# Patient Record
Sex: Male | Born: 1935 | Race: White | Hispanic: No | Smoking: Former smoker
Health system: Southern US, Community
[De-identification: ages and names within clinical notes are randomized; demographics above are authoritative.]

## PROBLEM LIST (undated history)

## (undated) DIAGNOSIS — I2510 Atherosclerotic heart disease of native coronary artery without angina pectoris: Secondary | ICD-10-CM

## (undated) DIAGNOSIS — Z951 Presence of aortocoronary bypass graft: Secondary | ICD-10-CM

## (undated) DIAGNOSIS — F0390 Unspecified dementia without behavioral disturbance: Secondary | ICD-10-CM

## (undated) DIAGNOSIS — E119 Type 2 diabetes mellitus without complications: Secondary | ICD-10-CM

## (undated) DIAGNOSIS — E785 Hyperlipidemia, unspecified: Secondary | ICD-10-CM

## (undated) DIAGNOSIS — Z95810 Presence of automatic (implantable) cardiac defibrillator: Secondary | ICD-10-CM

## (undated) HISTORY — PX: CORONARY ARTERY BYPASS GRAFT: SHX141

## (undated) HISTORY — PX: PACEMAKER INSERTION: SHX728

---

## 2015-01-04 ENCOUNTER — Inpatient Hospital Stay (HOSPITAL_COMMUNITY): Payer: Medicare Other

## 2015-01-04 ENCOUNTER — Encounter (HOSPITAL_COMMUNITY): Payer: Self-pay | Admitting: Emergency Medicine

## 2015-01-04 ENCOUNTER — Emergency Department (HOSPITAL_COMMUNITY): Payer: Medicare Other

## 2015-01-04 ENCOUNTER — Inpatient Hospital Stay (HOSPITAL_COMMUNITY)
Admission: EM | Admit: 2015-01-04 | Discharge: 2015-01-08 | DRG: 092 | Disposition: A | Discharge status: Skilled Nursing Facility | Payer: Medicare Other | Attending: Internal Medicine | Admitting: Internal Medicine

## 2015-01-04 DIAGNOSIS — I5022 Chronic systolic (congestive) heart failure: Secondary | ICD-10-CM | POA: Diagnosis present

## 2015-01-04 DIAGNOSIS — Z79899 Other long term (current) drug therapy: Secondary | ICD-10-CM | POA: Diagnosis not present

## 2015-01-04 DIAGNOSIS — N183 Chronic kidney disease, stage 3 (moderate): Secondary | ICD-10-CM | POA: Diagnosis present

## 2015-01-04 DIAGNOSIS — R32 Unspecified urinary incontinence: Secondary | ICD-10-CM | POA: Diagnosis present

## 2015-01-04 DIAGNOSIS — Z95810 Presence of automatic (implantable) cardiac defibrillator: Secondary | ICD-10-CM

## 2015-01-04 DIAGNOSIS — I2510 Atherosclerotic heart disease of native coronary artery without angina pectoris: Secondary | ICD-10-CM | POA: Diagnosis present

## 2015-01-04 DIAGNOSIS — R0600 Dyspnea, unspecified: Secondary | ICD-10-CM | POA: Diagnosis not present

## 2015-01-04 DIAGNOSIS — R7989 Other specified abnormal findings of blood chemistry: Secondary | ICD-10-CM | POA: Diagnosis present

## 2015-01-04 DIAGNOSIS — Z7982 Long term (current) use of aspirin: Secondary | ICD-10-CM | POA: Diagnosis not present

## 2015-01-04 DIAGNOSIS — I255 Ischemic cardiomyopathy: Secondary | ICD-10-CM | POA: Diagnosis present

## 2015-01-04 DIAGNOSIS — N179 Acute kidney failure, unspecified: Secondary | ICD-10-CM | POA: Diagnosis present

## 2015-01-04 DIAGNOSIS — Z951 Presence of aortocoronary bypass graft: Secondary | ICD-10-CM

## 2015-01-04 DIAGNOSIS — R1310 Dysphagia, unspecified: Secondary | ICD-10-CM | POA: Diagnosis present

## 2015-01-04 DIAGNOSIS — F0391 Unspecified dementia with behavioral disturbance: Secondary | ICD-10-CM | POA: Diagnosis not present

## 2015-01-04 DIAGNOSIS — G253 Myoclonus: Secondary | ICD-10-CM | POA: Diagnosis present

## 2015-01-04 DIAGNOSIS — F984 Stereotyped movement disorders: Secondary | ICD-10-CM | POA: Diagnosis present

## 2015-01-04 DIAGNOSIS — F518 Other sleep disorders not due to a substance or known physiological condition: Secondary | ICD-10-CM | POA: Diagnosis not present

## 2015-01-04 DIAGNOSIS — F0390 Unspecified dementia without behavioral disturbance: Secondary | ICD-10-CM | POA: Diagnosis present

## 2015-01-04 DIAGNOSIS — E785 Hyperlipidemia, unspecified: Secondary | ICD-10-CM | POA: Diagnosis present

## 2015-01-04 DIAGNOSIS — Z8679 Personal history of other diseases of the circulatory system: Secondary | ICD-10-CM | POA: Insufficient documentation

## 2015-01-04 DIAGNOSIS — E1122 Type 2 diabetes mellitus with diabetic chronic kidney disease: Secondary | ICD-10-CM | POA: Diagnosis present

## 2015-01-04 DIAGNOSIS — N189 Chronic kidney disease, unspecified: Secondary | ICD-10-CM | POA: Insufficient documentation

## 2015-01-04 HISTORY — DX: Unspecified dementia, unspecified severity, without behavioral disturbance, psychotic disturbance, mood disturbance, and anxiety: F03.90

## 2015-01-04 HISTORY — DX: Presence of aortocoronary bypass graft: Z95.1

## 2015-01-04 HISTORY — DX: Type 2 diabetes mellitus without complications: E11.9

## 2015-01-04 HISTORY — DX: Atherosclerotic heart disease of native coronary artery without angina pectoris: I25.10

## 2015-01-04 HISTORY — DX: Presence of automatic (implantable) cardiac defibrillator: Z95.810

## 2015-01-04 HISTORY — DX: Hyperlipidemia, unspecified: E78.5

## 2015-01-04 LAB — CBC WITH DIFFERENTIAL/PLATELET
BASOS ABS: 0 10*3/uL (ref 0.0–0.1)
BASOS PCT: 0 %
EOS ABS: 0.1 10*3/uL (ref 0.0–0.7)
Eosinophils Relative: 1 %
HCT: 40.6 % (ref 39.0–52.0)
HEMOGLOBIN: 13.6 g/dL (ref 13.0–17.0)
Lymphocytes Relative: 14 %
Lymphs Abs: 1.2 10*3/uL (ref 0.7–4.0)
MCH: 29.8 pg (ref 26.0–34.0)
MCHC: 33.5 g/dL (ref 30.0–36.0)
MCV: 89 fL (ref 78.0–100.0)
Monocytes Absolute: 0.5 10*3/uL (ref 0.1–1.0)
Monocytes Relative: 6 %
NEUTROS PCT: 79 %
Neutro Abs: 6.5 10*3/uL (ref 1.7–7.7)
Platelets: 167 10*3/uL (ref 150–400)
RBC: 4.56 MIL/uL (ref 4.22–5.81)
RDW: 14.1 % (ref 11.5–15.5)
WBC: 8.2 10*3/uL (ref 4.0–10.5)

## 2015-01-04 LAB — COMPREHENSIVE METABOLIC PANEL
ALBUMIN: 3.6 g/dL (ref 3.5–5.0)
ALK PHOS: 34 U/L — AB (ref 38–126)
ALT: 18 U/L (ref 17–63)
ANION GAP: 12 (ref 5–15)
AST: 37 U/L (ref 15–41)
BILIRUBIN TOTAL: 1.8 mg/dL — AB (ref 0.3–1.2)
BUN: 68 mg/dL — AB (ref 6–20)
CALCIUM: 9.2 mg/dL (ref 8.9–10.3)
CO2: 19 mmol/L — AB (ref 22–32)
Chloride: 104 mmol/L (ref 101–111)
Creatinine, Ser: 1.95 mg/dL — ABNORMAL HIGH (ref 0.61–1.24)
GFR calc Af Amer: 34 mL/min — ABNORMAL LOW (ref >60)
GFR calc non Af Amer: 29 mL/min — ABNORMAL LOW (ref >60)
GLUCOSE: 157 mg/dL — AB (ref 65–99)
POTASSIUM: 4.7 mmol/L (ref 3.5–5.1)
SODIUM: 135 mmol/L (ref 135–145)
TOTAL PROTEIN: 7 g/dL (ref 6.5–8.1)

## 2015-01-04 LAB — GLUCOSE, CAPILLARY: Glucose-Capillary: 105 mg/dL — ABNORMAL HIGH (ref 65–99)

## 2015-01-04 LAB — TSH: TSH: 2.817 u[IU]/mL (ref 0.350–4.500)

## 2015-01-04 LAB — URINALYSIS, ROUTINE W REFLEX MICROSCOPIC
BILIRUBIN URINE: NEGATIVE
GLUCOSE, UA: 100 mg/dL — AB
KETONES UR: 15 mg/dL — AB
LEUKOCYTES UA: NEGATIVE
Nitrite: NEGATIVE
PH: 5 (ref 5.0–8.0)
Protein, ur: NEGATIVE mg/dL
Specific Gravity, Urine: 1.019 (ref 1.005–1.030)
Urobilinogen, UA: 1 mg/dL (ref 0.0–1.0)

## 2015-01-04 LAB — I-STAT TROPONIN, ED: Troponin i, poc: 0.01 ng/mL (ref 0.00–0.08)

## 2015-01-04 LAB — PROTIME-INR
INR: 1.07 (ref 0.00–1.49)
PROTHROMBIN TIME: 14.1 s (ref 11.6–15.2)

## 2015-01-04 LAB — CK: Total CK: 149 U/L (ref 49–397)

## 2015-01-04 LAB — URINE MICROSCOPIC-ADD ON

## 2015-01-04 LAB — CBG MONITORING, ED: GLUCOSE-CAPILLARY: 110 mg/dL — AB (ref 65–99)

## 2015-01-04 LAB — I-STAT CG4 LACTIC ACID, ED: LACTIC ACID, VENOUS: 1.81 mmol/L (ref 0.5–2.0)

## 2015-01-04 LAB — PHOSPHORUS: PHOSPHORUS: 4 mg/dL (ref 2.5–4.6)

## 2015-01-04 LAB — VITAMIN B12: VITAMIN B 12: 320 pg/mL (ref 180–914)

## 2015-01-04 LAB — MAGNESIUM: MAGNESIUM: 2.4 mg/dL (ref 1.7–2.4)

## 2015-01-04 MED ORDER — ASPIRIN EC 81 MG PO TBEC
81.0000 mg | DELAYED_RELEASE_TABLET | Freq: Every day | ORAL | Status: DC
  Administered 2015-01-05 – 2015-01-08 (×4): 81 mg via ORAL
  Filled 2015-01-04 (×4): qty 1

## 2015-01-04 MED ORDER — INSULIN ASPART 100 UNIT/ML ~~LOC~~ SOLN
0.0000 [IU] | Freq: Three times a day (TID) | SUBCUTANEOUS | Status: DC
  Administered 2015-01-06: 3 [IU] via SUBCUTANEOUS
  Administered 2015-01-06: 1 [IU] via SUBCUTANEOUS
  Administered 2015-01-07: 3 [IU] via SUBCUTANEOUS
  Administered 2015-01-07 – 2015-01-08 (×3): 2 [IU] via SUBCUTANEOUS
  Administered 2015-01-08: 3 [IU] via SUBCUTANEOUS

## 2015-01-04 MED ORDER — SODIUM CHLORIDE 0.9 % IV SOLN
INTRAVENOUS | Status: DC
  Administered 2015-01-04: 23:00:00 via INTRAVENOUS

## 2015-01-04 MED ORDER — ONDANSETRON HCL 4 MG/2ML IJ SOLN: 4.0000 mg | Freq: Four times a day (QID) | INTRAMUSCULAR | Status: DC | PRN

## 2015-01-04 MED ORDER — LORAZEPAM 2 MG/ML IJ SOLN
0.5000 mg | Freq: Once | INTRAMUSCULAR | Status: AC
  Administered 2015-01-04: 0.5 mg via INTRAVENOUS
  Filled 2015-01-04: qty 1

## 2015-01-04 MED ORDER — ACETAMINOPHEN 650 MG RE SUPP: 650.0000 mg | Freq: Four times a day (QID) | RECTAL | Status: DC | PRN

## 2015-01-04 MED ORDER — ENOXAPARIN SODIUM 30 MG/0.3ML ~~LOC~~ SOLN
30.0000 mg | SUBCUTANEOUS | Status: DC
  Administered 2015-01-04 – 2015-01-07 (×4): 30 mg via SUBCUTANEOUS
  Filled 2015-01-04 (×5): qty 0.3

## 2015-01-04 MED ORDER — ASPIRIN 81 MG PO TABS: 81.0000 mg | ORAL_TABLET | Freq: Every day | ORAL | Status: DC

## 2015-01-04 MED ORDER — METOPROLOL SUCCINATE ER 25 MG PO TB24
50.0000 mg | ORAL_TABLET | Freq: Every day | ORAL | Status: DC
  Administered 2015-01-05 – 2015-01-08 (×4): 50 mg via ORAL
  Filled 2015-01-04 (×4): qty 2

## 2015-01-04 MED ORDER — QUETIAPINE FUMARATE 25 MG PO TABS
25.0000 mg | ORAL_TABLET | Freq: Every day | ORAL | Status: DC
  Administered 2015-01-05 – 2015-01-06 (×2): 25 mg via ORAL
  Filled 2015-01-04 (×3): qty 1

## 2015-01-04 MED ORDER — ACETAMINOPHEN 325 MG PO TABS: 650.0000 mg | ORAL_TABLET | Freq: Four times a day (QID) | ORAL | Status: DC | PRN

## 2015-01-04 MED ORDER — ONDANSETRON HCL 4 MG PO TABS: 4.0000 mg | ORAL_TABLET | Freq: Four times a day (QID) | ORAL | Status: DC | PRN

## 2015-01-04 NOTE — Progress Notes (Signed)
  Echocardiogram 2D Echocardiogram has been performed.  Janalyn Harder 01/04/2015, 3:08 PM

## 2015-01-04 NOTE — H&P (Addendum)
[facility] History and Physical  Christopher Waller KGM:[id number] DOB: [birth year] DOA: 01/04/2015  Referring physician:  PCP: No primary care provider on file.   Chief Complaint: spasms  HPI: Christopher Waller is a 81 y.o. male with PMH of CAd/CABG, AICD, DM, Early Dementia, Hearing loss, was sent to the ER by his nephew who is his healthcare power of attorney and primary caregiver. Patient and nephew Christopher Waller, along with Christopher Waller wife moved from [place] to 3 months ago, they are yet to see a primary doctor here, his nephew reports that for the last 2-3 days they have noticed intermittent jerking movements involving upper extremities primarily and sometimes lower legs. No loss of consciousness. No fever or chills no trauma or fall. No recent change in his medications, he was started on Seroquel 6-7 months ago for anxiety. In the ER workup notable for renal insufficiency, CT head unremarkable. EDP discussed with neurologist on call who recommended transfer to [facility] for video EEG  Review of Systems: Unable to obtain due to dementia Constitutional:  No weight loss, night sweats, Fevers, chills, fatigue.  HEENT:  No headaches, Difficulty swallowing,Tooth/dental problems,Sore throat,  No sneezing, itching, ear ache, nasal congestion, post nasal drip,  Cardio-vascular:  No chest pain, Orthopnea, PND, swelling in lower extremities, anasarca, dizziness, palpitations  GI:  No heartburn, indigestion, abdominal pain, nausea, vomiting, diarrhea, change in bowel habits, loss of appetite  Resp:  No shortness of breath with exertion or at rest. No excess mucus, no productive cough, No non-productive cough, No coughing up of blood.No change in color of mucus.No wheezing.No chest wall deformity  Skin:  no rash or lesions.  GU:  no dysuria, change in color of urine, no urgency or frequency. No flank pain.  Musculoskeletal:  No joint pain or swelling. No decreased range of motion.  No back pain.  Psych:  No change in mood or affect. No depression or anxiety. No memory loss.   Past Medical History  Diagnosis Date  . Coronary artery disease    Past Surgical History  Procedure Laterality Date  . Pacemaker insertion    . Coronary artery bypass graft     Social History:  has no tobacco, alcohol, and drug history on file.  Allergies not on file  No family history  -unable to obtain due to dementia  Prior to Admission medications   Medication Sig Start Date End Date Taking? Authorizing Provider  aspirin 81 MG tablet Take 81 mg by mouth daily.   Yes Historical Provider, MD  fenofibrate (TRICOR) 145 MG tablet Take 145 mg by mouth daily.   Yes Historical Provider, MD  furosemide (LASIX) 40 MG tablet Take 40 mg by mouth daily.   Yes Historical Provider, MD  glipiZIDE (GLUCOTROL XL) 10 MG 24 hr tablet Take 10 mg by mouth daily with breakfast.   Yes Historical Provider, MD  lisinopril (PRINIVIL,ZESTRIL) 10 MG tablet Take 10 mg by mouth daily.   Yes Historical Provider, MD  metoprolol succinate (TOPROL-XL) 50 MG 24 hr tablet Take 50 mg by mouth daily. Take with or immediately following a meal.   Yes Historical Provider, MD  QUEtiapine (SEROQUEL) 50 MG tablet Take 50 mg by mouth 2 (two) times daily.   Yes Historical Provider, MD  sitaGLIPtin (JANUVIA) 100 MG tablet Take 100 mg by mouth daily.   Yes Historical Provider, MD   Physical Exam: Filed Vitals:   01/04/15 1046 01/04/15 1157 01/04/15 1257  BP: 118/68 101/51 111/74  Pulse: 90 110  154  Temp: 98.2 F (36.8 C)    TempSrc: Oral    Resp: SpO2: 99% 95% 79%    Wt Readings from Last 3 Encounters:  No data found for Wt    General:  Appears calm and comfortable, with intermittent jerky movement, orienetd to self only Eyes: PERRL, normal lids, irises & conjunctiva ENT: grossly normal hearing, lips & tongue Neck: no LAD, masses or thyromegaly Cardiovascular: RRR, no m/r/g. No LE edema. Telemetry: SR, no  arrhythmias  Respiratory: CTA bilaterally, no w/r/r. Normal respiratory effort. Abdomen: soft, ntnd Skin: no rash or induration seen on limited exam Musculoskeletal: grossly normal tone BUE/BLE Psychiatric: flat affect Neurologic: increased tone in upper and lower extremities, myoclonic jerking of upper and lower extremities          Labs on Admission:  Basic Metabolic Panel:  Recent Labs Lab 01/04/15 1103  NA 135  K 4.7  CL 104  CO2 19*  GLUCOSE 157*  BUN 68*  CREATININE 1.95*  CALCIUM 9.2  MG 2.4  PHOS 4.0   Liver Function Tests:  Recent Labs Lab 01/04/15 1103  AST 37  ALT 18  ALKPHOS 34*  BILITOT 1.8*  PROT 7.0  ALBUMIN 3.6   No results for input(s): LIPASE, AMYLASE in the last 168 hours. No results for input(s): AMMONIA in the last 168 hours. CBC:  Recent Labs Lab 01/04/15 1103  WBC 8.2  NEUTROABS 6.5  HGB 13.6  HCT 40.6  MCV 89.0  PLT 167   Cardiac Enzymes:  Recent Labs Lab 01/04/15 1103  CKTOTAL 149    BNP (last 3 results) No results for input(s): BNP in the last 8760 hours.  ProBNP (last 3 results) No results for input(s): PROBNP in the last 8760 hours.  CBG: No results for input(s): GLUCAP in the last 168 hours.  Radiological Exams on Admission: Ct Head Wo Contrast  01/04/2015   CLINICAL DATA:  Seizure  EXAM: CT HEAD WITHOUT CONTRAST  TECHNIQUE: Contiguous axial images were obtained from the base of the skull through the vertex without intravenous contrast.  COMPARISON:  None.  FINDINGS: Moderate global atrophy. Minimal chronic ischemic changes in the periventricular white matter. No mass effect, midline shift, or acute hemorrhage. Mastoid air cells clear. Small mucous retention cyst in the right maxillary sinus. Nasal septum is somewhat deviated. Sinuses are otherwise clear. Cranium is intact.  IMPRESSION: No acute intracranial pathology.  Chronic changes are noted.   Electronically Signed   By: Jolaine Click M.D.   On: 01/04/2015 11:51     EKG: Independently reviewed. AV paced  Assessment/Plan    Myoclonic jerking -could related to Extra pyramidal side effects from Seroquel, Renal failure could be contributing -CT head unremarkabkle -will wean off Seroquel, cut down from  BID to  QHS and stop -Neuro consulted by EDP, recommended Transfer to [facility] and video EEG -Potassium, Calcium and magnesium within normal limits, also check ammonia level      H/o CAD/CABG and Cardiomyopathy with ACID -stable, continue ASA, Toprol, hold ACE -check ECHO for baseline -hold lasix at this time    DM -SSI, hold oral meds, check Hbaic     Mild Cognitive dysfunction/Early dementia -check TSH, B12    Renal insufficency -baseline unknown, hydrate, hold ACE and monitor  Code Status: Full Code DVT Prophylaxis: lovenox Family Communication: nephew Christopher Waller at bedside Disposition Plan: transfer to [facility]  Time spent:  [facility] Pager [phone]

## 2015-01-04 NOTE — Consult Note (Addendum)
Admission H&P    Chief Complaint: New onset myoclonic like movements.  HPI: Christopher Waller is an 81 y.o. male with a history of IVs mellitus, dementia, hyperlipidemia, AICD placement and coronary artery disease, presenting with new onset involuntary myoclonic like movements of extremities. Onset was 2-3 days ago according to family. Movements are present during wakefulness as well as during sleep. He's had no recent changes in medications. He is on cervical well 50 mg twice a day and has been on this dose of medication for 6-7 months. He does have signs of renal failure with BUN of 68 and creatinine 1.95. Blood sugar was 157. Patient has had no changes in level of consciousness nor apparent noticeable deterioration in cognitive functioning over the past few days. CT scan of his head showed no acute intracranial abnormality. Serum electrolytes are unremarkable. Liver enzymes were also unremarkable.  Past Medical History  Diagnosis Date  . Coronary artery disease   . Dementia   . Diabetes mellitus without complication ([place])   . Hyperlipidemia     Past Surgical History  Procedure Laterality Date  . Pacemaker insertion    . Coronary artery bypass graft      Family history: Unavailable due to patient's dementia.  Social History:  has no tobacco, alcohol, and drug history on file.  Allergies: No Known Allergies  Medications Prior to Admission  Medication Sig Dispense Refill  . aspirin 81 MG tablet Take 81 mg by mouth daily with breakfast.     . fenofibrate (TRICOR) 145 MG tablet Take 145 mg by mouth at bedtime.     . furosemide (LASIX) 40 MG tablet Take 40 mg by mouth daily with breakfast.     . glipiZIDE (GLUCOTROL XL) 10 MG 24 hr tablet Take 10 mg by mouth daily with breakfast.    . lisinopril (PRINIVIL,ZESTRIL) 10 MG tablet Take 10 mg by mouth at bedtime.     . Meclizine HCl (BONINE PO) Take 1 tablet by mouth daily as needed (for dizziness).    . metoprolol succinate (TOPROL-XL) 50  MG 24 hr tablet Take 50 mg by mouth daily with breakfast. Take with or immediately following a meal.    . QUEtiapine (SEROQUEL) 50 MG tablet Take 50 mg by mouth 2 (two) times daily.    . sitaGLIPtin (JANUVIA) 100 MG tablet Take 100 mg by mouth daily with breakfast.       ROS: Unavailable due to patient's dementia.  Physical Examination: Blood pressure 93/51, pulse 73, temperature 98.4 F (36.9 C), temperature source Oral, resp. rate 23, SpO2 100 %.  HEENT-  Normocephalic, no lesions, without obvious abnormality.  Normal external eye and conjunctiva.  Normal TM's bilaterally.  Normal auditory canals and external ears. Normal external nose, mucus membranes and septum.  Normal pharynx. Neck supple with no masses, nodes, nodules or enlargement. Cardiovascular - regular rate and rhythm, S1, S2 normal, no murmur, click, rub or gallop Lungs - chest clear, no wheezing, rales, normal symmetric air entry Abdomen - soft, non-tender; bowel sounds normal; no masses,  no organomegaly Extremities - no edema and no skin discoloration  Neurologic Examination: Mental Status: Alert, disoriented to time and moderately confused.  Speech fluent without evidence of aphasia. Able to follow simple commands with mimicking (marked hearing loss). Cranial Nerves: II-Visual fields were normal with confrontation. III/IV/VI-Pupils were equal and reacted. Extraocular movements were full and conjugate.    V/VII-no facial numbness and no facial weakness. VIII-marked difficulty with hearing bilaterally. X-minimal dysarthria. XI: trapezius  strength/neck flexion strength normal bilaterally XII-midline tongue extension with normal strength Motor: 5/5 bilaterally with normal tone and bulk; frequent myoclonic-like movements of upper and lower extremities occurring primarily in upper extremities, either involving one extremity, or both simultaneously. Abnormal movements did not appear to be activity induced, and were present  during sleep prior to patient being aroused. Sensory: Normal throughout. Deep Tendon Reflexes: Trace only and symmetric in upper extremities and absent in lower extremities. Plantars: Mute bilaterally Cerebellar: Normal finger-to-nose testing except for minimal intention tremor bilaterally. Carotid auscultation: Normal  Results for orders placed or performed during the hospital encounter of 01/04/15 (from the past 48 hour(s))  CBC with Differential/Platelet     Status: None   Collection Time: 01/04/15 11:03 AM  Result Value Ref Range   WBC 8.2 4.0 - 10.5 K/uL   RBC 4.56 4.22 - 5.81 MIL/uL   Hemoglobin 13.6 13.0 - 17.0 g/dL   HCT 40.6 39.0 - 52.0 %   MCV 89.0 78.0 - 100.0 fL   MCH 29.8 26.0 - 34.0 pg   MCHC 33.5 30.0 - 36.0 g/dL   RDW 14.1 11.5 - 15.5 %   Platelets 167 150 - 400 K/uL   Neutrophils Relative % 79 %   Neutro Abs 6.5 1.7 - 7.7 K/uL   Lymphocytes Relative 14 %   Lymphs Abs 1.2 0.7 - 4.0 K/uL   Monocytes Relative 6 %   Monocytes Absolute 0.5 0.1 - 1.0 K/uL   Eosinophils Relative 1 %   Eosinophils Absolute 0.1 0.0 - 0.7 K/uL   Basophils Relative 0 %   Basophils Absolute 0.0 0.0 - 0.1 K/uL  Comprehensive metabolic panel     Status: Abnormal   Collection Time: 01/04/15 11:03 AM  Result Value Ref Range   Sodium 135 135 - 145 mmol/L   Potassium 4.7 3.5 - 5.1 mmol/L   Chloride 104 101 - 111 mmol/L   CO2 19 (L) 22 - 32 mmol/L   Glucose, Bld 157 (H) 65 - 99 mg/dL   BUN 68 (H) 6 - 20 mg/dL   Creatinine, Ser 1.95 (H) 0.61 - 1.24 mg/dL   Calcium 9.2 8.9 - 10.3 mg/dL   Total Protein 7.0 6.5 - 8.1 g/dL   Albumin 3.6 3.5 - 5.0 g/dL   AST 37 15 - 41 U/L   ALT 18 17 - 63 U/L   Alkaline Phosphatase 34 (L) 38 - 126 U/L   Total Bilirubin 1.8 (H) 0.3 - 1.2 mg/dL   GFR calc non Af Amer 29 (L) >60 mL/min   GFR calc Af Amer 34 (L) >60 mL/min    Comment: (NOTE) The eGFR has been calculated using the CKD EPI equation. This calculation has not been validated in all clinical  situations. eGFR's persistently <60 mL/min signify possible Chronic Kidney Disease.    Anion gap 12 5 - 15  Protime-INR     Status: None   Collection Time: 01/04/15 11:03 AM  Result Value Ref Range   Prothrombin Time 14.1 11.6 - 15.2 seconds   INR 1.07 0.00 - 1.49  Magnesium     Status: None   Collection Time: 01/04/15 11:03 AM  Result Value Ref Range   Magnesium 2.4 1.7 - 2.4 mg/dL  Phosphorus     Status: None   Collection Time: 01/04/15 11:03 AM  Result Value Ref Range   Phosphorus 4.0 2.5 - 4.6 mg/dL  CK     Status: None   Collection Time: 01/04/15 11:03 AM  Result  Value Ref Range   Total CK 149 49 - 397 U/L  I-stat troponin, ED     Status: None   Collection Time: 01/04/15 11:29 AM  Result Value Ref Range   Troponin i, poc 0.01 0.00 - 0.08 ng/mL   Comment 3            Comment: Due to the release kinetics of cTnI, a negative result within the first hours of the onset of symptoms does not rule out myocardial infarction with certainty. If myocardial infarction is still suspected, repeat the test at appropriate intervals.   I-Stat CG4 Lactic Acid, ED     Status: None   Collection Time: 01/04/15 11:31 AM  Result Value Ref Range   Lactic Acid, Venous 1.81 0.5 - 2.0 mmol/L  Urinalysis, Routine w reflex microscopic (not at [facility])     Status: Abnormal   Collection Time: 01/04/15  1:20 PM  Result Value Ref Range   Color, Urine YELLOW YELLOW   APPearance CLOUDY (A) CLEAR   Specific Gravity, Urine 1.019 1.005 - 1.030   pH 5.0 5.0 - 8.0   Glucose, UA 100 (A) NEGATIVE mg/dL   Hgb urine dipstick TRACE (A) NEGATIVE   Bilirubin Urine NEGATIVE NEGATIVE   Ketones, ur 15 (A) NEGATIVE mg/dL   Protein, ur NEGATIVE NEGATIVE mg/dL   Urobilinogen, UA 1.0 0.0 - 1.0 mg/dL   Nitrite NEGATIVE NEGATIVE   Leukocytes, UA NEGATIVE NEGATIVE  Urine microscopic-add on     Status: Abnormal   Collection Time: 01/04/15  1:20 PM  Result Value Ref Range   Squamous Epithelial / LPF RARE RARE   WBC,  UA 0-2 <3 WBC/hpf   RBC / HPF 3-6 <3 RBC/hpf   Bacteria, UA MANY (A) RARE   Urine-Other MUCOUS PRESENT   CBG monitoring, ED     Status: Abnormal   Collection Time: 01/04/15  5:37 PM  Result Value Ref Range   Glucose-Capillary 110 (H) 65 - 99 mg/dL   Ct Head Wo Contrast  01/04/2015   CLINICAL DATA:  Seizure  EXAM: CT HEAD WITHOUT CONTRAST  TECHNIQUE: Contiguous axial images were obtained from the base of the skull through the vertex without intravenous contrast.  COMPARISON:  None.  FINDINGS: Moderate global atrophy. Minimal chronic ischemic changes in the periventricular white matter. No mass effect, midline shift, or acute hemorrhage. Mastoid air cells clear. Small mucous retention cyst in the right maxillary sinus. Nasal septum is somewhat deviated. Sinuses are otherwise clear. Cranium is intact.  IMPRESSION: No acute intracranial pathology.  Chronic changes are noted.   Electronically Signed   By: Marybelle Killings M.D.   On: 01/04/2015 11:51    Assessment/Plan 81 year old man with history of dementia, diabetes mellitus, hyperlipidemia, coronary artery disease and AICD placement, with new onset involuntary myoclonic like movements of unclear etiology. Abnormal movements do not appear to be movement induced. Seizure activity is unlikely due to the widespread distribution of abnormal movements with no apparent changes in patient's level of alertness nor a sling cognitive functioning. Renal dysfunction may be a contributing factor.  Recommendations: 1. MRI of the brain without contrast 2. EEG, routine adult study. If EEG shows indications of focal status epilepticus, which is unlikely, video EEG long-term monitoring is recommended. 3. Serum ammonia level. 4. Nephrology consult  We will continue to follow this patient with you.  C.R. Nicole Kindred, [place] [facility] [phone]  01/04/2015, 8:12 PM

## 2015-01-04 NOTE — ED Notes (Signed)
[facility] here 

## 2015-01-04 NOTE — ED Notes (Signed)
Pt from home, sent to ED by care taker due to concern of UTI, spasm or possible pacemaker firing,. Pt HOH not able to give a goiod hx, pt denies pain. Bruise to LT under eye, incoint on arrival, alert to person and place,

## 2015-01-04 NOTE — ED Notes (Signed)
Patient transported to CT 

## 2015-01-04 NOTE — ED Notes (Signed)
Called to give report at [facility], nurse will have to call me back.

## 2015-01-04 NOTE — ED Notes (Signed)
Bed: WA06 Expected date:  Expected time:  Means of arrival:  Comments: Elderly spasms

## 2015-01-04 NOTE — Progress Notes (Signed)
Patient arrived around 1830, EMT ran several cardio strips on him and were able to indicate that upper body tremors coincide directly with patients AICD administering defibrillating shock. Patient BP is soft on arrival he is alert to self with poor memory and poor lower body strength and movement. Will give full details to oncoming nurse. Christopher Waller

## 2015-01-04 NOTE — ED Provider Notes (Addendum)
CSN: [id number]     Arrival date & time 01/04/15  1027 History   First MD Initiated Contact with Patient 01/04/15 1047     Chief Complaint  Patient presents with  . Urinary Incontinence  . Spasms     (Consider location/radiation/quality/duration/timing/severity/associated sxs/prior Treatment) HPI  Patient is a demented 81 year old male presenting with jerking motions. Patient is not accompanied by any family members. Per EMS patient's been working more than usual for the last 3 days. EMS was concerned that it was his pacemaker firing. It appears that he is AV paced.   L5 caveat dementia.  Past Medical History  Diagnosis Date  . Coronary artery disease    Past Surgical History  Procedure Laterality Date  . Pacemaker insertion    . Coronary artery bypass graft     No family history on file. Social History  Substance Use Topics  . Smoking status: Not on file  . Smokeless tobacco: Not on file  . Alcohol Use: Not on file    Review of Systems  Unable to perform ROS: Dementia      Allergies  Review of patient's allergies indicates not on file.  Home Medications   Prior to Admission medications   Medication Sig Start Date End Date Taking? Authorizing Provider  aspirin 81 MG tablet Take 81 mg by mouth daily.   Yes Historical Provider, MD  fenofibrate (TRICOR) 145 MG tablet Take 145 mg by mouth daily.   Yes Historical Provider, MD  furosemide (LASIX) 40 MG tablet Take 40 mg by mouth daily.   Yes Historical Provider, MD  glipiZIDE (GLUCOTROL XL) 10 MG 24 hr tablet Take 10 mg by mouth daily with breakfast.   Yes Historical Provider, MD  lisinopril (PRINIVIL,ZESTRIL) 10 MG tablet Take 10 mg by mouth daily.   Yes Historical Provider, MD  metoprolol succinate (TOPROL-XL) 50 MG 24 hr tablet Take 50 mg by mouth daily. Take with or immediately following a meal.   Yes Historical Provider, MD  QUEtiapine (SEROQUEL) 50 MG tablet Take 50 mg by mouth 2 (two) times daily.   Yes  Historical Provider, MD  sitaGLIPtin (JANUVIA) 100 MG tablet Take 100 mg by mouth daily.   Yes Historical Provider, MD   BP 111/74 mmHg  Pulse 154  Temp(Src) 98.2 F (36.8 C) (Oral)  Resp 22  SpO2 79% Physical Exam  Constitutional: He is oriented to person, place, and time. He appears well-nourished.  Jerking motions bilateral upper extremities.  HENT:  Head: Normocephalic.  Eyes: Conjunctivae are normal.  Cardiovascular: Normal rate, regular rhythm and normal heart sounds.   Pulmonary/Chest: Effort normal. No respiratory distress. He has no wheezes. He has no rales.  Neurological: He is oriented to person, place, and time.  Skin: Skin is warm and dry. He is not diaphoretic.  Psychiatric: He has a normal mood and affect. His behavior is normal.    ED Course  Procedures (including critical care time) Labs Review Labs Reviewed  COMPREHENSIVE METABOLIC PANEL - Abnormal; Notable for the following:    CO2 19 (*)    Glucose, Bld 157 (*)    BUN 68 (*)    Creatinine, Ser 1.95 (*)    Alkaline Phosphatase 34 (*)    Total Bilirubin 1.8 (*)    GFR calc non Af Amer 29 (*)    GFR calc Af Amer 34 (*)    All other components within normal limits  URINE CULTURE  CBC WITH DIFFERENTIAL/PLATELET  PROTIME-INR  MAGNESIUM  PHOSPHORUS  CK  URINALYSIS, ROUTINE W REFLEX MICROSCOPIC (NOT AT [facility])  I-STAT TROPOININ, ED  I-STAT CG4 LACTIC ACID, ED  I-STAT CG4 LACTIC ACID, ED    Imaging Review Ct Head Wo Contrast  01/04/2015   CLINICAL DATA:  Seizure  EXAM: CT HEAD WITHOUT CONTRAST  TECHNIQUE: Contiguous axial images were obtained from the base of the skull through the vertex without intravenous contrast.  COMPARISON:  None.  FINDINGS: Moderate global atrophy. Minimal chronic ischemic changes in the periventricular white matter. No mass effect, midline shift, or acute hemorrhage. Mastoid air cells clear. Small mucous retention cyst in the right maxillary sinus. Nasal septum is somewhat deviated.  Sinuses are otherwise clear. Cranium is intact.  IMPRESSION: No acute intracranial pathology.  Chronic changes are noted.   Electronically Signed   By: Jolaine Click M.D.   On: 01/04/2015 11:51   I have personally reviewed and evaluated these images and lab results as part of my medical decision-making.   EKG Interpretation None      MDM   Final diagnoses:  None    Patient is an 81 year old man presented with increased jerking motion. Unfortunately there is no family with patient so we cannot get a more detailed excretion. We will run labs, including urinary analysis. Patient smells like urine.  These jerks are slightly abnormal  because after some of the jerks, he appears to have a moment where he is out of it. I'm concerned about these being atypical complex focal seizures. I talked to neurology. We will transfer to [facility] to get a video EEG.   Ylonda Storr Randall An, MD 01/04/15 1336  Bernard Donahoo Randall An, MD 01/04/15 1345

## 2015-01-04 NOTE — ED Notes (Signed)
Family POA [name] (nephew) number [phone]

## 2015-01-04 NOTE — ED Notes (Signed)
RN unable to take report at this time.  Will call back.

## 2015-01-04 NOTE — Care Management Note (Addendum)
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  Christopher Waller nephew, caregiver has used recommendation of some neighbors and has only called 1 doctor but Doctor unable to see pt for 2 months.  Christopher Waller wife of Christopher Waller [phone]     C/o UTI, spasm or possible pacemaker firing,. HOH   Pt physically noted with spasms while CM visiting   Pt not a good historian at this time  Action/Plan: Cm spoke with ED RN & Christopher Waller Cm confirmed correct address and updated EPIC bu placing Christopher Waller number in as contact  Provided lists of medicare doctors for internal medicine, cardiology, urology and podiatry within zip [phone] to assist with finding new providers  CM wrote on each what pt would need doctor for  Left in pt belonging bag on pt bed for family Family not in room when CM returned Pt updated He nodded his head  Expected Discharge Date:     Pending labs             Expected Discharge Plan:     IStatus of Service:   completed  Additional Comments:  Christopher Shoulder, RN 01/04/2015, 1:49 PM

## 2015-01-05 ENCOUNTER — Inpatient Hospital Stay (HOSPITAL_COMMUNITY)
Admit: 2015-01-05 | Discharge: 2015-01-05 | Disposition: A | Discharge status: Home / Self Care | Payer: Medicare Other | Attending: Neurology | Admitting: Neurology

## 2015-01-05 ENCOUNTER — Encounter (HOSPITAL_COMMUNITY): Payer: Self-pay | Admitting: Internal Medicine

## 2015-01-05 DIAGNOSIS — I5022 Chronic systolic (congestive) heart failure: Secondary | ICD-10-CM | POA: Insufficient documentation

## 2015-01-05 DIAGNOSIS — N183 Chronic kidney disease, stage 3 (moderate): Secondary | ICD-10-CM

## 2015-01-05 DIAGNOSIS — Z95810 Presence of automatic (implantable) cardiac defibrillator: Secondary | ICD-10-CM

## 2015-01-05 DIAGNOSIS — N189 Chronic kidney disease, unspecified: Secondary | ICD-10-CM | POA: Insufficient documentation

## 2015-01-05 DIAGNOSIS — E785 Hyperlipidemia, unspecified: Secondary | ICD-10-CM | POA: Insufficient documentation

## 2015-01-05 DIAGNOSIS — Z951 Presence of aortocoronary bypass graft: Secondary | ICD-10-CM

## 2015-01-05 DIAGNOSIS — Z8679 Personal history of other diseases of the circulatory system: Secondary | ICD-10-CM | POA: Insufficient documentation

## 2015-01-05 DIAGNOSIS — G253 Myoclonus: Principal | ICD-10-CM

## 2015-01-05 HISTORY — DX: Presence of aortocoronary bypass graft: Z95.1

## 2015-01-05 HISTORY — DX: Presence of automatic (implantable) cardiac defibrillator: Z95.810

## 2015-01-05 LAB — COMPREHENSIVE METABOLIC PANEL
ALT: 15 U/L — AB (ref 17–63)
AST: 28 U/L (ref 15–41)
Albumin: 3.1 g/dL — ABNORMAL LOW (ref 3.5–5.0)
Alkaline Phosphatase: 31 U/L — ABNORMAL LOW (ref 38–126)
Anion gap: 10 (ref 5–15)
BILIRUBIN TOTAL: 1.3 mg/dL — AB (ref 0.3–1.2)
BUN: 51 mg/dL — AB (ref 6–20)
CO2: 25 mmol/L (ref 22–32)
CREATININE: 1.72 mg/dL — AB (ref 0.61–1.24)
Calcium: 9.4 mg/dL (ref 8.9–10.3)
Chloride: 108 mmol/L (ref 101–111)
GFR, EST AFRICAN AMERICAN: 39 mL/min — AB (ref >60)
GFR, EST NON AFRICAN AMERICAN: 34 mL/min — AB (ref >60)
Glucose, Bld: 105 mg/dL — ABNORMAL HIGH (ref 65–99)
POTASSIUM: 4.3 mmol/L (ref 3.5–5.1)
Sodium: 143 mmol/L (ref 135–145)
TOTAL PROTEIN: 6.3 g/dL — AB (ref 6.5–8.1)

## 2015-01-05 LAB — GLUCOSE, CAPILLARY
GLUCOSE-CAPILLARY: 89 mg/dL (ref 65–99)
GLUCOSE-CAPILLARY: 89 mg/dL (ref 65–99)
Glucose-Capillary: 107 mg/dL — ABNORMAL HIGH (ref 65–99)
Glucose-Capillary: 114 mg/dL — ABNORMAL HIGH (ref 65–99)

## 2015-01-05 LAB — URINE CULTURE: CULTURE: NO GROWTH

## 2015-01-05 LAB — HEMOGLOBIN A1C
HEMOGLOBIN A1C: 8.2 % — AB (ref 4.8–5.6)
Mean Plasma Glucose: 189 mg/dL

## 2015-01-05 MED ORDER — FENOFIBRATE 160 MG PO TABS
160.0000 mg | ORAL_TABLET | Freq: Every day | ORAL | Status: DC
  Administered 2015-01-05 – 2015-01-08 (×4): 160 mg via ORAL
  Filled 2015-01-05 (×4): qty 1

## 2015-01-05 MED ORDER — FUROSEMIDE 40 MG PO TABS
40.0000 mg | ORAL_TABLET | Freq: Every day | ORAL | Status: DC
  Administered 2015-01-05 – 2015-01-08 (×4): 40 mg via ORAL
  Filled 2015-01-05 (×4): qty 1

## 2015-01-05 NOTE — Progress Notes (Signed)
EEG completed; results pending.    

## 2015-01-05 NOTE — Progress Notes (Addendum)
Called patients [employer] and obtained information on AICD manufacture is [place], [place] # [id number] patients account # [id number].  [employer]  Rep # [phone] I will called and paged the representative awaiting call back. [employer] has been notified and is on the way, also the device was replaced in 2014 and had a wire repair after that the device serial # is [id number] H model # may be [id number]

## 2015-01-05 NOTE — Evaluation (Signed)
Clinical/Bedside Swallow Evaluation Patient Details  Name: Christopher Waller MRN: [id number] Date of Birth: 1924-09-25  Today's Date: 01/05/2015 Time: SLP Start Time (ACUTE ONLY): 0930 SLP Stop Time (ACUTE ONLY): 0955 SLP Time Calculation (min) (ACUTE ONLY): 25 min  Past Medical History:  Past Medical History  Diagnosis Date  . Coronary artery disease   . Dementia   . Diabetes mellitus without complication (HCC)   . Hyperlipidemia    Past Surgical History:  Past Surgical History  Procedure Laterality Date  . Pacemaker insertion    . Coronary artery bypass graft     HPI:  81 y.o. male with PMH of CAd/CABG, AICD, DM, Early Dementia, Hearing loss, was sent to the ER by his nephew who is his healthcare power of attorney and primary caregiver.   Assessment / Plan / Recommendation Clinical Impression   Pt exhibited wet vocal quality intermittently during BSE with various consistencies; weak cough elicited and unable to elicit a swallow initially, but pt exhibited xerostomia and nursing informed SLP he was also a "mouth breather" at night.  Pt failed swallow screen d/t vocal quality being poor; no delayed or immediate coughing noted with any consistency during BSE; pt exhibits speech which appears dysarthric and possible velopharyngeal insufficiency as well.  OME was limited, but velar involvement noted.  Pt also exhibits cognitive deficits which impact his swallowing as well as he was noted frequently to become distracted during intake and exhibited impulsivity as well, which could impact safety of swallowing.  MBS recommended with Dysphagia 2/thin diet and potential upgrade pending MBS.    Aspiration Risk  Moderate    Diet Recommendation Dysphagia 2 (Fine chop);Thin   Medication Administration: Whole meds with puree Compensations: Slow rate;Small sips/bites    Other  Recommendations Oral Care Recommendations: Oral care QID   Follow Up Recommendations       Frequency and Duration min  2x/week  2 weeks   Pertinent Vitals/Pain Elevated BP; afebrile    SLP Swallow Goals  See POC; pending MBS   Swallow Study Prior Functional Status   Recent move from [place]; modified independent    General Date of Onset: 01/04/15 Other Pertinent Information: 81 y.o. male with PMH of CAd/CABG, AICD, DM, Early Dementia, Hearing loss, was sent to the ER by his nephew who is his healthcare power of attorney and primary caregiver. Type of Study: Bedside swallow evaluation Previous Swallow Assessment: Stroke swallow screen; failed d/t vocal quality poor Diet Prior to this Study: NPO Temperature Spikes Noted: No Respiratory Status: Room air History of Recent Intubation: No Behavior/Cognition: Alert;Cooperative;Requires cueing;Other (Comment) (HOH) Oral Cavity - Dentition: Adequate natural dentition/normal for age Self-Feeding Abilities: Able to feed self;Needs assist Patient Positioning: Upright in bed Baseline Vocal Quality: Wet Volitional Cough: Weak Volitional Swallow: Unable to elicit    Oral/Motor/Sensory Function Overall Oral Motor/Sensory Function: Impaired Velum: Impaired right;Impaired left   Ice Chips Ice chips: Within functional limits Presentation: Spoon Other Comments:  (residual intermittent wet vocal quality; vf insufficiency?)   Thin Liquid Thin Liquid: Impaired Presentation: Cup;Straw Oral Phase Functional Implications: Other (comment) (cognition affecting swallowing ) Pharyngeal  Phase Impairments: Wet Vocal Quality    Nectar Thick Nectar Thick Liquid: Not tested   Honey Thick Honey Thick Liquid: Not tested   Puree Puree: Within functional limits Presentation: Spoon   Solid       Solid: Impaired Presentation: Spoon Oral Phase Impairments: Impaired mastication       ADAMS,PAT, M.S., CCC-SLP 01/05/2015,11:06 AM

## 2015-01-05 NOTE — Consult Note (Signed)
ELECTROPHYSIOLOGY CONSULT NOTE  Patient ID: Christopher Waller, MRN: [id number], DOB/AGE: 06/09/1924 81 y.o. Admit date: 01/04/2015 Date of Consult: 01/05/2015  Primary Physician: No primary care provider on file. Primary Cardiologist: new  Chief Complaint: jerky movements   HPI Christopher Waller is a 81 y.o. male  Admitted with involuntary myoclonic leg jerks. He has a history of a previously implanted ICD and we are asked to see him to see if the ICD is responsible.  His past medical history is scant and is obtained from the past record. He has a history of bypass surgery diabetes and dementia.  i am unable to obtain hsitory       Past Medical History  Diagnosis Date  . Coronary artery disease   . Dementia   . Diabetes mellitus without complication (HCC)   . Hyperlipidemia       Surgical History:  Past Surgical History  Procedure Laterality Date  . Pacemaker insertion    . Coronary artery bypass graft       Home Meds: Prior to Admission medications   Medication Sig Start Date End Date Taking? Authorizing Provider  aspirin 81 MG tablet Take 81 mg by mouth daily with breakfast.    Yes Historical Provider, MD  fenofibrate (TRICOR) 145 MG tablet Take 145 mg by mouth at bedtime.    Yes Historical Provider, MD  furosemide (LASIX) 40 MG tablet Take 40 mg by mouth daily with breakfast.    Yes Historical Provider, MD  glipiZIDE (GLUCOTROL XL) 10 MG 24 hr tablet Take 10 mg by mouth daily with breakfast.   Yes Historical Provider, MD  lisinopril (PRINIVIL,ZESTRIL) 10 MG tablet Take 10 mg by mouth at bedtime.    Yes Historical Provider, MD  Meclizine HCl (BONINE PO) Take 1 tablet by mouth daily as needed (for dizziness).   Yes Historical Provider, MD  metoprolol succinate (TOPROL-XL) 50 MG 24 hr tablet Take 50 mg by mouth daily with breakfast. Take with or immediately following a meal.   Yes Historical Provider, MD  QUEtiapine (SEROQUEL) 50 MG tablet Take 50 mg by mouth 2 (two)  times daily.   Yes Historical Provider, MD  sitaGLIPtin (JANUVIA) 100 MG tablet Take 100 mg by mouth daily with breakfast.    Yes Historical Provider, MD    Inpatient Medications:  . aspirin EC  81 mg Oral Daily  . enoxaparin (LOVENOX) injection  30 mg Subcutaneous Q24H  . fenofibrate  160 mg Oral Daily  . furosemide  40 mg Oral Q breakfast  . insulin aspart  0-9 Units Subcutaneous TID WC  . metoprolol succinate  50 mg Oral Daily  . QUEtiapine  25 mg Oral QHS     Allergies: No Known Allergies  Social History   Social History  . Marital Status: Unknown    Spouse Name: N/A  . Number of Children: N/A  . Years of Education: N/A   Occupational History  . Not on file.   Social History Main Topics  . Smoking status: Not on file  . Smokeless tobacco: Not on file  . Alcohol Use: Not on file  . Drug Use: Not on file  . Sexual Activity: Not on file   Other Topics Concern  . Not on file   Social History Narrative  . No narrative on file     No family history on file.   ROS:  Please see the history of present illness.   History obtained from unobtainable from patient due to  mental status  All other systems reviewed and negative.    Physical Exam:   Blood pressure 103/46, pulse 103, temperature 98 F (36.7 C), temperature source Oral, resp. rate 20, SpO2 96 %. General: Well developed, cachetic  male in no acute distress. Head: Normocephalic, atraumatic, EENT: normal Back: not examinable Neck: suple. Lungs: Clear  laterally to auscultation  Heart: RRR with S1 S2  Abdomen: Soft, non-tender, non-distended with normoactive bowel sounds. No hepatomegaly. No rebound/guarding. No obvious abdominal masses. Msk:  Strength and tone appear normal for age. Extremities: No clubbing or cyanosis. No * edema.  Distal pedal pulses are 2+ and equal bilaterally. Skin: Warm and Dry Neuro: sleepy and not responisve to voice CN III-XII intact Grossly normal sensory and motor function . Psych:   Responds to questions appropriately with a normal affect.      Labs: Cardiac Enzymes  Recent Labs  01/04/15 1103  CKTOTAL 149   CBC Lab Results  Component Value Date   WBC 8.2 01/04/2015   HGB 13.6 01/04/2015   HCT 40.6 01/04/2015   MCV 89.0 01/04/2015   PLT 167 01/04/2015   PROTIME:  Recent Labs  01/04/15 1103  LABPROT 14.1  INR 1.07   Chemistry  Recent Labs Lab 01/05/15 0431  NA 143  K 4.3  CL 108  CO2 25  BUN 51*  CREATININE 1.72*  CALCIUM 9.4  PROT 6.3*  BILITOT 1.3*  ALKPHOS 31*  ALT 15*  AST 28  GLUCOSE 105*   Lipids No results found for: CHOL, HDL, LDLCALC, TRIG BNP No results found for: PROBNP Thyroid Function Tests:  Recent Labs  01/04/15 2012  TSH 2.817      Miscellaneous No results found for: DDIMER  Radiology/Studies:  Ct Head Wo Contrast  01/04/2015   CLINICAL DATA:  Seizure  EXAM: CT HEAD WITHOUT CONTRAST  TECHNIQUE: Contiguous axial images were obtained from the base of the skull through the vertex without intravenous contrast.  COMPARISON:  None.  FINDINGS: Moderate global atrophy. Minimal chronic ischemic changes in the periventricular white matter. No mass effect, midline shift, or acute hemorrhage. Mastoid air cells clear. Small mucous retention cyst in the right maxillary sinus. Nasal septum is somewhat deviated. Sinuses are otherwise clear. Cranium is intact.  IMPRESSION: No acute intracranial pathology.  Chronic changes are noted.   Electronically Signed   By: Jolaine Click M.D.   On: 01/04/2015 11:51    EKG:AV pacing    Assessment and Plan: Implantable CRT-D  Coronary disease with prior CABG  Congestive heart failure chronic systolic  Myoclonic jerks  Dementia    Device interrogation demonstrates that there is no evidence of ICD discharge. Another potential explanation could be diaphragmatic stimulation. The left ventricular pacing lead. Maximum output testing failed to cause diaphragmatic stimulation   No  cardiac explanation/ICD explanation for the myoclonic jerks.  Call for further questions   Sherryl Manges

## 2015-01-05 NOTE — Progress Notes (Signed)
PATIENT DETAILS Name: Christopher Waller Age: 81 y.o. Sex: male Date of Birth: Jul 11, 1924 Admit Date: 01/04/2015 Admitting Physician [name], MD PCP:No primary care provider on file.  Subjective: Pleasantly confused. No major issues overnight. No "jerky" movements during my rounds this am. Per RN-when he had these jerky movments-was complaining of chest pain-and asking to "stop it"  Assessment/Plan: Active Problems: Involuntary myoclonic like movements of unclear etiology: appreciate Neuro input-await EEG. Since some question whether AICD discharge causing these movement-will interrogate AICD and consult EP.  H/o CAD/CABG :although pleasantly confused-appear to be stable. Continue ASA/Toprol  Chronic Systolic CHF with ACID: EF on Echo around 35-40%.Clinically compensated-resume Lasix. Given elevated creatinine hold of on resuming Lisinopril for now  Stage 3 CKD vs ARF: follow lytes.Euvolemic on exam-stop IVF and monitor  DM-2:CBG's stable-continue SSI-resume Glipizide on discharge.  Dementia:with mild delirium-continue Seroquel.  Disposition: Remain inpatient  Antimicrobial agents  See below  Anti-infectives    None      DVT Prophylaxis: Prophylactic Lovenox   Code Status: Full code   Family Communication None at bedside  Procedures: None  CONSULTS:  cardiology and neurology  Time spent 25 minutes-Greater than 50% of this time was spent in counseling, explanation of diagnosis, planning of further management, and coordination of care.  MEDICATIONS: Scheduled Meds: . aspirin EC  81 mg Oral Daily  . enoxaparin (LOVENOX) injection  30 mg Subcutaneous Q24H  . insulin aspart  0-9 Units Subcutaneous TID WC  . metoprolol succinate  50 mg Oral Daily  . QUEtiapine  25 mg Oral QHS   Continuous Infusions: . sodium chloride 75 mL/hr at 01/04/15 2239   PRN Meds:.acetaminophen **OR** acetaminophen, ondansetron **OR** ondansetron (ZOFRAN)  IV    PHYSICAL EXAM: Vital signs in last 24 hours: Filed Vitals:   01/04/15 1753 01/04/15 2137 01/05/15 0512 01/05/15 0958  BP:  110/49 118/52 136/72  Pulse:  74 72 81  Temp:  98.4 F (36.9 C) 98 F (36.7 C) 98.8 F (37.1 C)  TempSrc:  Oral Oral Oral  Resp: SpO2:  100% 100% 100%    Weight change:  There were no vitals filed for this visit. There is no height or weight on file to calculate BMI.   Gen Exam: Awake, mostly alert with clear speech.  Neck: Supple, No JVD.   Chest: B/L Clear.   CVS: S1 S2 Regular, no murmurs.  Abdomen: soft, BS +, non tender, non distended.  Extremities: no edema, lower extremities warm to touch. Neurologic: Non Focal.   Skin: No Rash.   Wounds: N/A.   Intake/Output from previous day:  Intake/Output Summary (Last 24 hours) at 01/05/15 1330 Last data filed at 01/05/15 0630  Gross per 24 hour  Intake      0 ml  Output    300 ml  Net   -300 ml     LAB RESULTS: CBC  Recent Labs Lab 01/04/15 1103  WBC 8.2  HGB 13.6  HCT 40.6  PLT 167  MCV 89.0  MCH 29.8  MCHC 33.5  RDW 14.1  LYMPHSABS 1.2  MONOABS 0.5  EOSABS 0.1  BASOSABS 0.0    Chemistries   Recent Labs Lab 01/04/15 1103 01/05/15 0431  NA 135 143  K 4.7 4.3  CL 104 108  CO2 19* 25  GLUCKainen Struckman*  BUN 68* 51*  CREATININE 1.95* 1.72*  CALCIUM 9.2 9.4  MG 2.4  --     CBG:  Recent Labs Lab 01/04/15 1737 01/04/15 2102 01/05/15 0619 01/05/15 1203  GLUCAP 110* 105* 89 89    GFR CrCl cannot be calculated (Unknown ideal weight.).  Coagulation profile  Recent Labs Lab 01/04/15 1103  INR 1.07    Cardiac Enzymes No results for input(s): CKMB, TROPONINI, MYOGLOBIN in the last 168 hours.  Invalid input(s): CK  Invalid input(s): POCBNP No results for input(s): DDIMER in the last 72 hours.  Recent Labs  01/04/15 1103  HGBA1C 8.2*   No results for input(s): CHOL, HDL, LDLCALC, TRIG, CHOLHDL, LDLDIRECT in the last 72  hours.  Recent Labs  01/04/15 2012  TSH 2.817    Recent Labs  01/04/15 2012  VITAMINB12 320   No results for input(s): LIPASE, AMYLASE in the last 72 hours.  Urine Studies No results for input(s): UHGB, CRYS in the last 72 hours.  Invalid input(s): UACOL, UAPR, USPG, UPH, UTP, UGL, UKET, UBIL, UNIT, UROB, ULEU, UEPI, UWBC, URBC, UBAC, CAST, UCOM, BILUA  MICROBIOLOGY: Recent Results (from the past 240 hour(s))  Urine culture     Status: None   Collection Time: 01/04/15  1:20 PM  Result Value Ref Range Status   Specimen Description URINE, CATHETERIZED  Final   Special Requests NONE  Final   Culture   Final    NO GROWTH 1 DAY Performed at [facility]    Report Status 01/05/2015 FINAL  Final    RADIOLOGY STUDIES/RESULTS: Ct Head Wo Contrast  01/04/2015   CLINICAL DATA:  Seizure  EXAM: CT HEAD WITHOUT CONTRAST  TECHNIQUE: Contiguous axial images were obtained from the base of the skull through the vertex without intravenous contrast.  COMPARISON:  None.  FINDINGS: Moderate global atrophy. Minimal chronic ischemic changes in the periventricular white matter. No mass effect, midline shift, or acute hemorrhage. Mastoid air cells clear. Small mucous retention cyst in the right maxillary sinus. Nasal septum is somewhat deviated. Sinuses are otherwise clear. Cranium is intact.  IMPRESSION: No acute intracranial pathology.  Chronic changes are noted.   Electronically Signed   By: Jolaine Click M.D.   On: 01/04/2015 11:51    Jeoffrey Massed, MD  [facility] Pager:[phone]  If 7PM-7AM, please contact night-coverage [email or web address] Password [email or web address] 01/05/2015, 1:30 PM   LOS: 1 day

## 2015-01-05 NOTE — Progress Notes (Signed)
NEURO HOSPITALIST PROGRESS NOTE   SUBJECTIVE:                                                                                                                        Patient evaluated last night on initial consultation by my colleague Dr Roseanne Reno and I reviewed his notes. Resting in bed comfortably. Disoriented but can give me some details regarding his arm jerkiness.  For instance, he stated that the jerking movements " come and go out of the blue and my arms go out". No premonitory symptoms before the jerkiness. Said that can be problematic to eat when the jerkiness come. Patient has an AICD, unsure this will be MRI compatible. EEG completed. Serologies significant only for Cr 1.72 (improving).   OBJECTIVE:                                                                                                                           Vital signs in last 24 hours: Temp:  [98 F (36.7 C)-98.8 F (37.1 C)] 98.8 F (37.1 C) (10/08 0958) Pulse Rate:  [72-154] 81 (10/08 0958) Resp:  [16-26] 20 (10/08 0958) BP: (93-136)/(49-79) 136/72 mmHg (10/08 0958) SpO2:  [79 %-100 %] 100 % (10/08 0958)  Intake/Output from previous day: 10/07 0701 - 10/08 0700 In: -  Out: 300 [Urine:300] Intake/Output this shift:   Nutritional status: DIET DYS 2 Room service appropriate?: Yes; Fluid consistency:: Thin  Past Medical History  Diagnosis Date  . Coronary artery disease   . Dementia   . Diabetes mellitus without complication (HCC)   . Hyperlipidemia    Physical Examination: HEENT- Normocephalic, no lesions, without obvious abnormality. Normal external eye and conjunctiva. Normal TM's bilaterally. Normal auditory canals and external ears. Normal external nose, mucus membranes and septum. Normal pharynx. Neck supple with no masses, nodes, nodules or enlargement. Cardiovascular - regular rate and rhythm, S1, S2 normal, no murmur, click, rub or gallop Lungs - chest  clear, no wheezing, rales, normal symmetric air entry Abdomen - soft, non-tender; bowel sounds normal; no masses, no organomegaly Extremities - no edema and no skin discoloration  Neurologic Exam:  Mental Status: Alert, disoriented to time and moderately confused. Speech fluent without evidence of aphasia. Able to follow  simple commands.  Cranial Nerves: II-Visual fields were normal with confrontation. III/IV/VI-Pupils were equal and reacted. Extraocular movements were full and conjugate.   V/VII-no facial numbness and no facial weakness. VIII-marked difficulty with hearing bilaterally. X-minimal dysarthria. XI: trapezius strength/neck flexion strength normal bilaterally XII-midline tongue extension with normal strength Motor: 5/5 bilaterally with normal tone and bulk Sensory: Normal throughout. Deep Tendon Reflexes: Trace only and symmetric in upper extremities and absent in lower extremities. Plantars: Mute bilaterally Cerebellar: Normal finger-to-nose testing except for minimal intention tremor bilaterally  Lab Results: No results found for: CHOL Lipid Panel No results for input(s): CHOL, TRIG, HDL, CHOLHDL, VLDL, LDLCALC in the last 72 hours.  Studies/Results: Ct Head Wo Contrast  01/04/2015   CLINICAL DATA:  Seizure  EXAM: CT HEAD WITHOUT CONTRAST  TECHNIQUE: Contiguous axial images were obtained from the base of the skull through the vertex without intravenous contrast.  COMPARISON:  None.  FINDINGS: Moderate global atrophy. Minimal chronic ischemic changes in the periventricular white matter. No mass effect, midline shift, or acute hemorrhage. Mastoid air cells clear. Small mucous retention cyst in the right maxillary sinus. Nasal septum is somewhat deviated. Sinuses are otherwise clear. Cranium is intact.  IMPRESSION: No acute intracranial pathology.  Chronic changes are noted.   Electronically Signed   By: Jolaine Click M.D.   On: 01/04/2015 11:51    MEDICATIONS                                                                                                                         Scheduled: . aspirin EC  81 mg Oral Daily  . enoxaparin (LOVENOX) injection  30 mg Subcutaneous Q24H  . insulin aspart  0-9 Units Subcutaneous TID WC  . metoprolol succinate  50 mg Oral Daily  . QUEtiapine  25 mg Oral QHS    ASSESSMENT/PLAN:                                                                                                           81 year old man with history of dementia, diabetes mellitus, hyperlipidemia, coronary artery disease and AICD placement, with new onset involuntary myoclonic like movements of unclear etiology. Renal dysfunction may be a contributing factor. Unable to get MRI due to AICD. EEG completed, will review. Patient did not exhibit abnormal movements during my assessment. Will make further decisions after reviewing EEG but likely will give him a trial of Depakote if his hyperkinetic movement are functionally impairing him or evidence of epileptiform discharges on eeg.. Will follow up.  Wyatt Portela, MD [facility] [phone]  01/05/2015, 11:52 AM

## 2015-01-06 ENCOUNTER — Inpatient Hospital Stay (HOSPITAL_COMMUNITY): Payer: Medicare Other

## 2015-01-06 DIAGNOSIS — N179 Acute kidney failure, unspecified: Secondary | ICD-10-CM | POA: Insufficient documentation

## 2015-01-06 DIAGNOSIS — F518 Other sleep disorders not due to a substance or known physiological condition: Secondary | ICD-10-CM

## 2015-01-06 LAB — GLUCOSE, CAPILLARY
Glucose-Capillary: 111 mg/dL — ABNORMAL HIGH (ref 65–99)
Glucose-Capillary: 202 mg/dL — ABNORMAL HIGH (ref 65–99)
Glucose-Capillary: 147 mg/dL — ABNORMAL HIGH (ref 65–99)
Glucose-Capillary: 202 mg/dL — ABNORMAL HIGH (ref 65–99)

## 2015-01-06 LAB — BASIC METABOLIC PANEL
ANION GAP: 12 (ref 5–15)
BUN: 33 mg/dL — AB (ref 6–20)
CALCIUM: 9.4 mg/dL (ref 8.9–10.3)
CO2: 27 mmol/L (ref 22–32)
Chloride: 102 mmol/L (ref 101–111)
Creatinine, Ser: 1.4 mg/dL — ABNORMAL HIGH (ref 0.61–1.24)
GFR calc Af Amer: 50 mL/min — ABNORMAL LOW (ref >60)
GFR, EST NON AFRICAN AMERICAN: 43 mL/min — AB (ref >60)
GLUCOSE: 114 mg/dL — AB (ref 65–99)
Potassium: 3.6 mmol/L (ref 3.5–5.1)
Sodium: 141 mmol/L (ref 135–145)

## 2015-01-06 MED ORDER — DIVALPROEX SODIUM 125 MG PO CSDR
250.0000 mg | DELAYED_RELEASE_CAPSULE | Freq: Three times a day (TID) | ORAL | Status: DC
  Administered 2015-01-06 – 2015-01-08 (×7): 250 mg via ORAL
  Filled 2015-01-06 (×7): qty 2

## 2015-01-06 NOTE — Progress Notes (Signed)
NEURO HOSPITALIST PROGRESS NOTE   SUBJECTIVE:                                                                                                                        Offers no new neurological complains.  Ate breakfast and said that he is doing better and abnormal movements improving. EEG with slow background but no epileptiform discharges associated with frequent jerks during recording. Unable to get MRI due to AICD. Available labs reviewed: significant for Cr 1.40 (continuosly improving).   OBJECTIVE:                                                                                                                           Vital signs in last 24 hours: Temp:  [97 F (36.1 C)-98.8 F (37.1 C)] 97.9 F (36.6 C) (10/09 0612) Pulse Rate:  [69-103] 77 (10/09 0612) Resp:  [20] 20 (10/09 0612) BP: (92-136)/(46-72) 105/69 mmHg (10/09 0612) SpO2:  [96 %-100 %] 100 % (10/09 0612)  Intake/Output from previous day: 10/08 0701 - 10/09 0700 In: -  Out: 750 [Urine:750] Intake/Output this shift:   Nutritional status: DIET DYS 2 Room service appropriate?: Yes; Fluid consistency:: Thin  Past Medical History  Diagnosis Date  . Coronary artery disease   . Dementia   . Diabetes mellitus without complication (HCC)   . Hyperlipidemia   . Ischemic cardiomyopathy 01/05/2015  . Biventricular implantable cardioverter-defibrillator medtronic  01/05/2015   Physical Examination: HEENT- Normocephalic, no lesions, without obvious abnormality. Normal external eye and conjunctiva. Normal TM's bilaterally. Normal auditory canals and external ears. Normal external nose, mucus membranes and septum. Normal pharynx. Neck supple with no masses, nodes, nodules or enlargement. Cardiovascular - regular rate and rhythm, S1, S2 normal, no murmur, click, rub or gallop Lungs - chest clear, no wheezing, rales, normal symmetric air entry Abdomen - soft, non-tender; bowel sounds  normal; no masses, no organomegaly Extremities - no edema and no skin discoloration  Neurologic Exam:  Mental Status: Alert, disoriented to time and moderately confused. Speech fluent without evidence of aphasia. Able to follow simple commands.  Cranial Nerves: II-Visual fields were normal with confrontation. III/IV/VI-Pupils were equal and reacted. Extraocular movements were full and conjugate.   V/VII-no facial numbness and  no facial weakness. VIII-marked difficulty with hearing bilaterally. X-minimal dysarthria. XI: trapezius strength/neck flexion strength normal bilaterally XII-midline tongue extension with normal strength Motor: 5/5 bilaterally with normal tone and bulk Sensory: Normal throughout. Deep Tendon Reflexes: Trace only and symmetric in upper extremities and absent in lower extremities. Plantars: Mute bilaterally Cerebellar: Normal finger-to-nose testing except for minimal intention tremor bilaterally. Did not exhibit myoclonus during this exam.   Lab Results: No results found for: CHOL Lipid Panel No results for input(s): CHOL, TRIG, HDL, CHOLHDL, VLDL, LDLCALC in the last 72 hours.  Studies/Results: Ct Head Wo Contrast  01/04/2015   CLINICAL DATA:  Seizure  EXAM: CT HEAD WITHOUT CONTRAST  TECHNIQUE: Contiguous axial images were obtained from the base of the skull through the vertex without intravenous contrast.  COMPARISON:  None.  FINDINGS: Moderate global atrophy. Minimal chronic ischemic changes in the periventricular white matter. No mass effect, midline shift, or acute hemorrhage. Mastoid air cells clear. Small mucous retention cyst in the right maxillary sinus. Nasal septum is somewhat deviated. Sinuses are otherwise clear. Cranium is intact.  IMPRESSION: No acute intracranial pathology.  Chronic changes are noted.   Electronically Signed   By: Jolaine Click M.D.   On: 01/04/2015 11:51    MEDICATIONS                                                                                                                         Scheduled: . aspirin EC  81 mg Oral Daily  . enoxaparin (LOVENOX) injection  30 mg Subcutaneous Q24H  . fenofibrate  160 mg Oral Daily  . furosemide  40 mg Oral Q breakfast  . insulin aspart  0-9 Units Subcutaneous TID WC  . metoprolol succinate  50 mg Oral Daily  . QUEtiapine  25 mg Oral QHS    ASSESSMENT/PLAN:                                                                                                            81 year old man with history of dementia, diabetes mellitus, hyperlipidemia, coronary artery disease and AICD placement, with new onset involuntary myoclonic like movements of unclear etiology. EEG showed no evidence of concomitant electrographic changed associated with patient paroxysmal movements. 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 Of note, his renal function is improving and it seems that his myoclonic movements have been less prominent since yesterday. Cardiology input appreciated: no cardiac explanation/ICD explanation for the myoclonic jerks.  Consider trial low dose Depakote if myoclonus persists despite normalization of renal function, as patient indicated that the jerking movements in his right hand impairs his ability to eat. (250 mg TID) if no contraindications. Neurology will sign off   Wyatt Portela, MD [facility] [phone]  01/06/2015, 8:43 AM

## 2015-01-06 NOTE — Procedures (Signed)
EEG report.  Brief clinical history:  81 year old man with history of dementia, diabetes mellitus, hyperlipidemia, coronary artery disease and AICD placement, with new onset involuntary myoclonic like movements of unclear etiology.    Technique: this is a 17 channel routine scalp EEG performed at the bedside with bipolar and monopolar montages arranged in accordance to the international 10/20 system of electrode placement. One channel was dedicated to EKG recording.  No deep sleep was achieved during recording. No activating procedures.  Description: the study is obscured by significant amount of muscle artifact, but in the wakeful state the best background consisted of a low amplitude, posterior dominant, poorly sustained, symmetric and reactive 6-7 Hz rhythm. No focal or generalized epileptiform discharges noted.  Throughout the recording, patient had multiple myoclonic like movements chiefly involving the upper extremities and they were not correlated with electrographic abnormalities. EKG showed sinus rhythm.  Impression: this is an abnormal awake EEG because of the presence of a slow background. The finding is consistent with a mild non specific encephalopathy as seen in a wide variety ot toxic metabolic and degenerative disorders like AD. Importantly, patient multiple myoclonic like movements were not correlated with electrographic abnormalities. Please, be aware that the lack of epileptiform discharges on EEG does not exclude the possibility of epilepsy.  Clinical correlation is advised.   Wyatt Portela, MD [facility]

## 2015-01-06 NOTE — Progress Notes (Signed)
Utilization Review Completed.Christopher Waller T10/11/2014  

## 2015-01-06 NOTE — Progress Notes (Signed)
MBS scheduled for approx 1030 today.  RN informed Donavan Burnet, MS [facility] SLP [phone]

## 2015-01-06 NOTE — Evaluation (Signed)
Physical Therapy Evaluation Patient Details Name: Christopher Waller MRN: [id number] DOB: [birth year] Today's Date: 01/06/2015   History of Present Illness  81 y.o. male admitted for Involuntary myoclonic like movements of unclear etiology. Hx of CAD/CABG, Chronic Systolic CHF with ACID, dementia, and Stage 3 CKD vs ARF.  Clinical Impression  Pt admitted with the above complications. Pt currently with functional limitations due to the deficits listed below (see PT Problem List). Frequent assist to prevent falls while attempting to ambulate very short distance. Leans posteriorly and has spontaneous buckling of LEs. Poor coordination of UEs and LEs, Lt side > Rt. Do not feel pt would be safe at home due to high fall risk. Recommend SNF for rehabilitation and skilled care until pt can be more independent with ambulation. Pt will benefit from skilled PT to increase their independence and safety with mobility to allow discharge to the venue listed below.       Follow Up Recommendations SNF;Supervision/Assistance - 24 hour    Equipment Recommendations  None recommended by PT    Recommendations for Other Services       Precautions / Restrictions Precautions Precautions: Fall Restrictions Weight Bearing Restrictions: No      Mobility  Bed Mobility Overal bed mobility: Needs Assistance Bed Mobility: Supine to Sit;Sit to Supine     Supine to sit: Min assist;HOB elevated Sit to supine: Min guard   General bed mobility comments: Min assist for truncal support to rise to EOB using rail as needed. min guard for safety to return to supine. VC for technique  Transfers Overall transfer level: Needs assistance Equipment used: Rolling walker (2 wheeled) Transfers: Sit to/from Stand Sit to Stand: Mod assist         General transfer comment: Mod assist for balance, falling towards posterior requiring assist to correct.VC for hand placement.  Ambulation/Gait Ambulation/Gait assistance: Mod  assist Ambulation Distance (Feet): 20 Feet Assistive device: Rolling walker (2 wheeled) Gait Pattern/deviations: Step-to pattern;Decreased stride length;Shuffle;Ataxic;Trunk flexed Gait velocity: slow Gait velocity interpretation: <1.8 ft/sec, indicative of risk for recurrent falls General Gait Details: Mod assist for balance frequently falling towards the posterior. BIL knees buckle spontaneously at times. Needs assist to remain in RW.  Stairs            Wheelchair Mobility    Modified Rankin (Stroke Patients Only) Modified Rankin (Stroke Patients Only) Pre-Morbid Rankin Score: Moderate disability Modified Rankin: Moderately severe disability     Balance Overall balance assessment: Needs assistance;History of Falls Sitting-balance support: No upper extremity supported;Feet supported Sitting balance-Leahy Scale: Fair     Standing balance support: Bilateral upper extremity supported Standing balance-Leahy Scale: Poor                               Pertinent Vitals/Pain Pain Assessment: No/denies pain    Home Living Family/patient expects to be discharged to:: Unsure Living Arrangements: Other relatives (Aurther Loft, [place]) Available Help at Discharge: Family Type of Home: Apartment Home Access: Stairs to enter   Secretary/administrator of Steps: 2 Home Layout: Two level;Bed/bath upstairs Home Equipment: Walker - 2 wheels Additional Comments: Pt is a poor historian. Unsure of accuracy of home living answers. No family available at this time    Prior Function Level of Independence: Needs assistance   Gait / Transfers Assistance Needed: RW for mobility  ADL's / Homemaking Assistance Needed: States he could wash himself "a little bit"  Hand Dominance   Dominant Hand: Right    Extremity/Trunk Assessment   Upper Extremity Assessment: Defer to OT evaluation (Dysmetria LUE, BIL intention tremor)           Lower Extremity Assessment: LLE  deficits/detail   LLE Deficits / Details: abnormal heel knee shin test on Lt     Communication   Communication: No difficulties  Cognition Arousal/Alertness: Awake/alert Behavior During Therapy: WFL for tasks assessed/performed Overall Cognitive Status: No family/caregiver present to determine baseline cognitive functioning                      General Comments General comments (skin integrity, edema, etc.): Pt is a poor historian however reports these myoclonic symptoms began months ago, prior to moving to [employer].    Exercises        Assessment/Plan    PT Assessment Patient needs continued PT services  PT Diagnosis Difficulty walking;Abnormality of gait;Generalized weakness;Altered mental status   PT Problem List Decreased strength;Decreased activity tolerance;Decreased balance;Decreased mobility;Decreased coordination;Decreased cognition;Decreased knowledge of use of DME;Decreased safety awareness  PT Treatment Interventions DME instruction;Gait training;Functional mobility training;Therapeutic activities;Therapeutic exercise;Balance training;Neuromuscular re-education;Cognitive remediation;Patient/family education   PT Goals (Current goals can be found in the Care Plan section) Acute Rehab PT Goals Patient Stated Goal: Go home PT Goal Formulation: With patient Time For Goal Achievement: 01/20/15 Potential to Achieve Goals: Good    Frequency Min 3X/week   Barriers to discharge Other (comment) (Unsure of amount of assist pt has at home)      Co-evaluation               End of Session Equipment Utilized During Treatment: Gait belt Activity Tolerance: Patient limited by fatigue Patient left: in bed;with call bell/phone within reach;with bed alarm set Nurse Communication: Mobility status;Precautions         Time: 1610-9604 PT Time Calculation (min) (ACUTE ONLY): 27 min   Charges:   PT Evaluation $Initial PT Evaluation Tier I: 1 Procedure PT  Treatments $Therapeutic Activity: 8-22 mins   PT G CodesBerton Mount 01/06/2015, 9:16 AM Charlsie Merles, PT [phone]

## 2015-01-06 NOTE — Progress Notes (Signed)
Assessment / Plan / Recommendation CHL IP CLINICAL IMPRESSIONS 01/06/2015  Therapy Diagnosis Moderate pharyngeal phase dysphagia;Severe pharyngeal phase dysphagia;Mild oral phase dysphagia;Other (Comment)  Clinical Impression   Pt presents with mild oral, moderately severe pharyngeal dysphagia with suspected esophageal component as well.  Decreased oral bolus cohesion most notably with thin via straw - allowed premature spillage into pharynx with delayed swallow to pyriform sinus and deep large amount of laryngeal penetration.  Surprisingly, pt did not aspirate and cleared approx 95% of penetrates with swallow.  Pharyngeal swallow characterized by sensorimotor deficits with poor laryngeal elevation/tongue base retraction and pharyngeal contraction.  Deficits resulted in severe pharyngeal residuals across consistencies *worse with increased viscocity.  Pt with poor sensation to said residuals that mixed with secretions but fortunately was able to "hock" to clear them consistently t/o MBS!  Also liquid swallows assist to transit/decrease pharyngeal residuals that mixed with copious secretions.   Chin tuck attempted and not helpful.   Pt admits to difficultiles with "secretions" in throat during "winter time".  He is a chronic aspiration risk due to amount of residuals but with strategies in place, his risk can be minimized.    Suspected decreased CP relaxation due to decreased laryngeal elevation and pyriform sinus residuals, pt without sensation to residuals, upon esophageal sweep - appearance of slow clearance distally with mild retrograde propulsion and ? esophageal spasm/tortuous shape of esophagus- recommend strict reflux precautions  Pt needs full assist with meals and will require extra time due to his cognitive deficits and level of dysphagia. Using live video, educated pt to findings and effective compensation strategies.          CHL IP TREATMENT RECOMMENDATION 01/06/2015  Treatment  Recommendations Therapy as outlined in treatment plan below     CHL IP DIET RECOMMENDATION 01/06/2015  SLP Diet Recommendations Dysphagia 3 (Mech soft);Thin  Liquid Administration via NO STRAW  Medication Administration Crushed with puree  Compensations Slow rate;Small sips/bites;Other (Comment), "hock" and expectorate t/o meal, follow solids with liquids  Postural Changes and/or Swallow Maneuvers 90* + 30 min after meals       Donavan Burnet, MS [facility] SLP [phone]

## 2015-01-06 NOTE — Progress Notes (Signed)
Provider has been notified about BP's below identified treshholds.

## 2015-01-06 NOTE — Progress Notes (Addendum)
PATIENT DETAILS Name: Christopher Waller Age: 81 y.o. Sex: male Date of Birth: 28-Nov-1924 Admit Date: 01/04/2015 Admitting Physician [name], MD PCP:No primary care provider on file.  Subjective: Pleasantly confused. 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 Seroquel dose reduced to 25 mg daily-renal function improving. Neuro recommending trial of Depakote 250 mg TID. CT head/EEG neg. Evaluated by EP-no evidence of AICD discharge. Suspect requires no further work up, plans are to discharge to SNF in am.   ZOX:[id number] to pre-renal azotemia. Continue to hold Lisinopril-given frailty/risk for further ARF-will not plan on resuming Lisinopril on discharge.   H/o CAD/CABG :although pleasantly confused-appear to be stable. Continue ASA/Toprol  Chronic Systolic CHF with ACID: EF on Echo around 35-40%.Clinically compensated-continue Lasix. See above regarding ACEI  DM-2:A1C 8.2.CBG's stable-continue SSI-resume Glipizide on discharge.  Dementia:with mild delirium-continue Seroquel.  Dysphagia:seen by SLP-recommendations are to continue Dys 3 diet-spoke with nephew [name] over the phone-aware of aspiration risk.  Deconditioning:PT eval completed-recommendation are for SNF.   Disposition: Remain inpatient-SNF 10/10  Antimicrobial agents  See below  Anti-infectives    None      DVT Prophylaxis: Prophylactic Lovenox   Code Status: DNI-spoke wit [name] (nephew) over the phone-no intubation-but ok with CPR/Cardioversion.   Family Communication Nephew-[name]-over the phone  Procedures: None  CONSULTS:  cardiology and neurology  Time spent 25 minutes-Greater than 50% of this time was spent in counseling, explanation of diagnosis, planning of further management, and coordination of  care.  MEDICATIONS: Scheduled Meds: . aspirin EC  81 mg Oral Daily  . divalproex  250 mg Oral 3 times per day  . enoxaparin (LOVENOX) injection  30 mg Subcutaneous Q24H  . fenofibrate  160 mg Oral Daily  . furosemide  40 mg Oral Q breakfast  . insulin aspart  0-9 Units Subcutaneous TID WC  . metoprolol succinate  50 mg Oral Daily  . QUEtiapine  25 mg Oral QHS   Continuous Infusions:   PRN Meds:.acetaminophen **OR** acetaminophen, ondansetron **OR** ondansetron (ZOFRAN) IV    PHYSICAL EXAM: Vital signs in last 24 hours: Filed Vitals:   01/06/15 0201 01/06/15 0612 01/06/15 1045 01/06/15 1051  BP: 104/53 105/69 84/60 118/60  Pulse: 71 77 70   Temp: 98.2 F (36.8 C) 97.9 F (36.6 C) 97.6 F (36.4 C)   TempSrc: Oral Oral Oral   Resp: SpO2: 100% 100% 100%     Weight change:  There were no vitals filed for this visit. There is no height or weight on file to calculate BMI.   Gen Exam: Awake, mostly alert with clear speech.  Neck: Supple, No JVD.   Chest: B/L Clear.   CVS: S1 S2 Regular, no murmurs.  Abdomen: soft, BS +, non tender, non distended.  Extremities: no edema, lower extremities warm to touch. Neurologic: Non Focal.  No myoclonic jerks seen. Skin: No Rash.   Wounds: N/A.   Intake/Output from previous day:  Intake/Output Summary (Last 24 hours) at 01/06/15 1316 Last data filed at 01/06/15 0625  Gross per 24 hour  Intake      0 ml  Output    750 ml  Net   -750 ml     LAB RESULTS: CBC  Recent Labs Lab 01/04/15 1103  WBC 8.2  HGB 13.6  HCT  40.6  PLT 167  MCV 89.0  MCH 29.8  MCHC 33.5  RDW 14.1  LYMPHSABS 1.2  MONOABS 0.5  EOSABS 0.1  BASOSABS 0.0    Chemistries   Recent Labs Lab 01/04/15 1103 01/05/15 0431 01/06/15 0702  NA 135 143 141  K 4.7 4.3 3.6  CL 104 108 102  CO2 19* 25 27  GLUCOSE 157* 105* 114*  BUN 68* 51* 33*  CREATININE 1.95* 1.72* 1.40*  CALCIUM 9.2 9.4 9.4  MG 2.4  --   --     CBG:  Recent  Labs Lab 01/05/15 1203 01/05/15 1601 01/05/15 2225 01/06/15 0704 01/06/15 1146  GLUCAP 89 107* 114* 111* 202*    GFR CrCl cannot be calculated (Unknown ideal weight.).  Coagulation profile  Recent Labs Lab 01/04/15 1103  INR 1.07    Cardiac Enzymes No results for input(s): CKMB, TROPONINI, MYOGLOBIN in the last 168 hours.  Invalid input(s): CK  Invalid input(s): POCBNP No results for input(s): DDIMER in the last 72 hours.  Recent Labs  01/04/15 1103  HGBA1C 8.2*   No results for input(s): CHOL, HDL, LDLCALC, TRIG, CHOLHDL, LDLDIRECT in the last 72 hours.  Recent Labs  01/04/15 2012  TSH 2.817    Recent Labs  01/04/15 2012  VITAMINB12 320   No results for input(s): LIPASE, AMYLASE in the last 72 hours.  Urine Studies No results for input(s): UHGB, CRYS in the last 72 hours.  Invalid input(s): UACOL, UAPR, USPG, UPH, UTP, UGL, UKET, UBIL, UNIT, UROB, ULEU, UEPI, UWBC, URBC, UBAC, CAST, UCOM, BILUA  MICROBIOLOGY: Recent Results (from the past 240 hour(s))  Urine culture     Status: None   Collection Time: 01/04/15  1:20 PM  Result Value Ref Range Status   Specimen Description URINE, CATHETERIZED  Final   Special Requests NONE  Final   Culture   Final    NO GROWTH 1 DAY Performed at [facility]    Report Status 01/05/2015 FINAL  Final    RADIOLOGY STUDIES/RESULTS: Ct Head Wo Contrast  01/04/2015   CLINICAL DATA:  Seizure  EXAM: CT HEAD WITHOUT CONTRAST  TECHNIQUE: Contiguous axial images were obtained from the base of the skull through the vertex without intravenous contrast.  COMPARISON:  None.  FINDINGS: Moderate global atrophy. Minimal chronic ischemic changes in the periventricular white matter. No mass effect, midline shift, or acute hemorrhage. Mastoid air cells clear. Small mucous retention cyst in the right maxillary sinus. Nasal septum is somewhat deviated. Sinuses are otherwise clear. Cranium is intact.  IMPRESSION: No acute  intracranial pathology.  Chronic changes are noted.   Electronically Signed   By: Jolaine Click M.D.   On: 01/04/2015 11:51   Dg Swallowing Func-speech Pathology  01/06/2015    Objective Swallowing Evaluation:    Patient Details  Name: Christopher Waller MRN: [id number] Date of Birth: 14-Jul-1924  Today's Date: 01/06/2015 Time: SLP Start Time (ACUTE ONLY): 1120-SLP Stop Time (ACUTE ONLY): 1140 SLP Time Calculation (min) (ACUTE ONLY): 20 min  Past Medical History:  Past Medical History  Diagnosis Date  . Coronary artery disease   . Dementia   . Diabetes mellitus without complication (HCC)   . Hyperlipidemia   . Ischemic cardiomyopathy 01/05/2015  . Biventricular implantable cardioverter-defibrillator medtronic   01/05/2015   Past Surgical History:  Past Surgical History  Procedure Laterality Date  . Pacemaker insertion    . Coronary artery bypass graft     HPI:  Other Pertinent Information: 81  y.o. male with PMH of CAd/CABG, AICD, DM,  Early Dementia, Hearing loss, was sent to the ER by his nephew who is his  healthcare power of attorney and primary caregiver.  Pt exhibited wet  vocal quality intermittently during BSE with various consistencies; weak  cough elicited and unable to elicit a swallow initially, but pt exhibited  xerostomia and nursing informed SLP he was also a "mouth breather" at  night. Pt failed swallow screen d/t vocal quality being poor; no delayed  or immediate coughing noted with any consistency during BSE; pt exhibits  speech which appears dysarthric and possible velopharyngeal insufficiency  as well. OME was limited, but velar involvement noted. Pt also exhibits  cognitive deficits which impact his swallowing as well as he was noted  frequently to become distracted during intake and exhibited impulsivity as  well, which could impact safety of swallowing.CT head negative.   MBS  recommended with Dysphagia 2/thin diet.    No Data Recorded  Assessment / Plan / Recommendation CHL IP CLINICAL IMPRESSIONS  01/06/2015  Therapy Diagnosis Moderate pharyngeal phase dysphagia;Severe pharyngeal  phase dysphagia;Mild oral phase dysphagia;Other (Comment)  Clinical Impression   Pt presents with mild oral, moderately severe pharyngeal dysphagia with  suspected esophageal component as well.  Decreased oral bolus cohesion  most notably with thin via straw - allowed premature spillage into pharynx  with delayed swallow to pyriform sinus and deep large amount of laryngeal  penetration.  Surprisingly, pt did not aspirate and cleared approx 95% of  penetrates with swallow.  Pharyngeal swallow characterized by sensorimotor  deficits with poor laryngeal elevation/tongue base retraction and  pharyngeal contraction.  Deficits resulted in severe pharyngeal residuals  across consistencies *worse with increased viscocity.  Pt with poor  sensation to said residuals that mixed with secretions but fortunately was  able to "hock" to clear them consistently t/o MBS!  Also liquid swallows  assist to transit/decrease pharyngeal residuals that mixed with copious  secretions.   Chin tuck attempted and not helpful.   Pt admits to difficultiles with "secretions" in throat during "winter  time".  He is a chronic aspiration risk due to amount of residuals but  with strategies in place, his risk can be minimized.    Suspected decreased CP relaxation due to decreased laryngeal elevation and  pyriform sinus residuals, pt without sensation to residuals, upon  esophageal sweep - appearance of slow clearance distally with mild  retrograde propulsion and ? esophageal spasm/tortuous shape of esophagus-  recommend strict reflux precautions  Pt needs full assist with meals and will require extra time due to his  cognitive deficits and level of dysphagia. Using live video, educated pt  to findings and effective compensation strategies.          CHL IP TREATMENT RECOMMENDATION 01/06/2015  Treatment Recommendations Therapy as outlined in treatment plan below     CHL  IP DIET RECOMMENDATION 01/06/2015  SLP Diet Recommendations Dysphagia 3 (Mech soft);Thin  Liquid Administration via NO STRAW  Medication Administration Crushed with puree  Compensations Slow rate;Small sips/bites;Other (Comment), "hock" and  expectorate t/o meal, follow solids with liquids  Postural Changes and/or Swallow Maneuvers 90* + 30 min after meals      CHL IP OTHER RECOMMENDATIONS 01/06/2015  Recommended Consults (None)  Oral Care Recommendations Oral care before and after PO  Other Recommendations Have oral suction available     No flowsheet data found.   CHL IP FREQUENCY AND DURATION 01/06/2015  Speech Therapy Frequency (ACUTE  ONLY) min 2x/week  Treatment Duration 2 weeks         CHL IP REASON FOR REFERRAL 01/06/2015  Reason for Referral Objectively evaluate swallowing function     CHL IP ORAL PHASE 01/06/2015  Lips (None)  Tongue (None)  Mucous membranes (None)  Nutritional status (None)  Other (None)  Oxygen therapy (None)  Oral Phase Impaired  Oral - Pudding Teaspoon (None)  Oral - Pudding Cup (None)  Oral - Honey Teaspoon (None)  Oral - Honey Cup (None)  Oral - Honey Syringe (None)  Oral - Nectar Teaspoon (None)  Oral - Nectar Cup (None)  Oral - Nectar Straw (None)  Oral - Nectar Syringe (None)  Oral - Ice Chips (None)  Oral - Thin Teaspoon (None)  Oral - Thin Cup (None)  Oral - Thin Straw (None)  Oral - Thin Syringe (None)  Oral - Puree (None)  Oral - Mechanical Soft (None)  Oral - Regular (None)  Oral - Multi-consistency (None)  Oral - Pill (None)  Oral Phase - Comment (None)      CHL IP PHARYNGEAL PHASE 01/06/2015  Pharyngeal Phase Impaired  Pharyngeal - Pudding Teaspoon (None)  Penetration/Aspiration details (pudding teaspoon) (None)  Pharyngeal - Pudding Cup (None)  Penetration/Aspiration details (pudding cup) (None)  Pharyngeal - Honey Teaspoon (None)  Penetration/Aspiration details (honey teaspoon) (None)  Pharyngeal - Honey Cup (None)  Penetration/Aspiration details (honey cup) (None)  Pharyngeal -  Honey Syringe (None)  Penetration/Aspiration details (honey syringe) (None)  Pharyngeal - Nectar Teaspoon (None)  Penetration/Aspiration details (nectar teaspoon) (None)  Pharyngeal - Nectar Cup (None)  Penetration/Aspiration details (nectar cup) (None)  Pharyngeal - Nectar Straw (None)  Penetration/Aspiration details (nectar straw) (None)  Pharyngeal - Nectar Syringe (None)  Penetration/Aspiration details (nectar syringe) (None)  Pharyngeal - Ice Chips (None)  Penetration/Aspiration details (ice chips) (None)  Pharyngeal - Thin Teaspoon (None)  Penetration/Aspiration details (thin teaspoon) (None)  Pharyngeal - Thin Cup (None)  Penetration/Aspiration details (thin cup) (None)  Pharyngeal - Thin Straw (None)  Penetration/Aspiration details (thin straw) (None)  Pharyngeal - Thin Syringe (None)  Penetration/Aspiration details (thin syringe') (None)  Pharyngeal - Puree (None)  Penetration/Aspiration details (puree) (None)  Pharyngeal - Mechanical Soft (None)  Penetration/Aspiration details (mechanical soft) (None)  Pharyngeal - Regular (None)  Penetration/Aspiration details (regular) (None)  Pharyngeal - Multi-consistency (None)  Penetration/Aspiration details (multi-consistency) (None)  Pharyngeal - Pill (None)  Penetration/Aspiration details (pill) (None)  Pharyngeal Comment liquid swallows assisted with decreasing pharyngeal  residuals of solids/pureed, pt with deep laryngeal penetration with straw  sips of thin - without sensation, chin tuck not helpful to prevent  residuals,  cued "expectoration" "hock" cleared vallecular residuals  consistently!       CHL IP CERVICAL ESOPHAGEAL PHASE 01/06/2015  Cervical Esophageal Phase Impaired  Pudding Teaspoon (None)  Pudding Cup (None)  Honey Teaspoon (None)  Honey Cup (None)  Honey Straw (None)  Nectar Teaspoon Reduced cricopharyngeal relaxation  Nectar Cup Reduced cricopharyngeal relaxation  Nectar Straw (None)  Nectar Sippy Cup (None)  Thin Teaspoon Reduced cricopharyngeal  relaxation  Thin Cup Reduced cricopharyngeal relaxation  Thin Straw Reduced cricopharyngeal relaxation  Thin Sippy Cup (None)  Cervical Esophageal Comment suspected decreased CP relaxation due to  decreased laryngeal elevation and pyriform sinus residuals, pt without  sensation to residuals, upon esophageal sweep - appearance of slow  clearance distally with mild retrograde propulsion and ? esophageal  spasm/tortuous shape of esophagus- recommend strict reflux precautions    No flowsheet data found.  Donavan Burnet, MS [facility] SLP [phone]     Jeoffrey Massed, MD  [facility] Pager:[phone]  If 7PM-7AM, please contact night-coverage [email or web address] Password [email or web address] 01/06/2015, 1:16 PM   LOS: 2 days

## 2015-01-07 ENCOUNTER — Encounter (HOSPITAL_COMMUNITY): Payer: Self-pay | Admitting: *Deleted

## 2015-01-07 LAB — GLUCOSE, CAPILLARY
GLUCOSE-CAPILLARY: 163 mg/dL — AB (ref 65–99)
Glucose-Capillary: 225 mg/dL — ABNORMAL HIGH (ref 65–99)
Glucose-Capillary: 151 mg/dL — ABNORMAL HIGH (ref 65–99)
Glucose-Capillary: 219 mg/dL — ABNORMAL HIGH (ref 65–99)

## 2015-01-07 LAB — BASIC METABOLIC PANEL
ANION GAP: 10 (ref 5–15)
BUN: 33 mg/dL — ABNORMAL HIGH (ref 6–20)
CHLORIDE: 101 mmol/L (ref 101–111)
CO2: 25 mmol/L (ref 22–32)
Calcium: 8.9 mg/dL (ref 8.9–10.3)
Creatinine, Ser: 1.14 mg/dL (ref 0.61–1.24)
GFR calc non Af Amer: 55 mL/min — ABNORMAL LOW (ref >60)
Glucose, Bld: 171 mg/dL — ABNORMAL HIGH (ref 65–99)
Potassium: 3.6 mmol/L (ref 3.5–5.1)
SODIUM: 136 mmol/L (ref 135–145)

## 2015-01-07 MED ORDER — DIVALPROEX SODIUM 125 MG PO CSDR
250.0000 mg | DELAYED_RELEASE_CAPSULE | Freq: Three times a day (TID) | ORAL | Status: DC
Start: 2015-01-07 — End: 2015-05-09

## 2015-01-07 NOTE — Clinical Social Work Note (Signed)
CSW has had multipe conversation with the pt's nephew [name] regarding bed offers. CSW explained to [place] that [facility] can no longer offer a bed at this time. CSW explained that [facility] RN has not complete review of pt's clinicals. CSW explained that [facility] can not meet the pt's need. CSW provided [name] a list of accepting beds offers. [name] is not pleased with the offers and refused to agree to SNF placement. CSW explained to [place] the discharge.   CSW is awaiting to here back from [facility].    Shateria Paternostro, MSW, LCSWA [phone]

## 2015-01-07 NOTE — Clinical Social Work Note (Signed)
Clinical Social Work Assessment  Patient Details  Name: Christopher Waller MRN: [id number] Date of Birth: [birth year]  Date of referral:  01/07/15               Reason for consult:  Facility Placement                Permission sought to share information with:  Family Supports Permission granted to share information::     Name::     Autuore,[name]  Relationship::  nephew   Housing/Transportation Living arrangements for the past 2 months:  Single Family Home Source of Information:  Other (Comment Required) Immunologist ) Patient Interpreter Needed:  None Criminal Activity/Legal Involvement Pertinent to Current Situation/Hospitalization:  No - Comment as needed Significant Relationships:  Other Family Members Lives with:  Self Do you feel safe going back to the place where you live?  No Need for family participation in patient care:  Yes (Comment)  Care giving concerns:  [name] reported that the pt is unsafe at home alone. [name] also shared that he can not provided 24hour care.   Social Worker assessment / plan: CSW spoke with the pt's nephew [name]. CSW introduced self and purpose of the call. CSW discussed SNF rehab. CSW explained the SNF process. CSW explained insurance and its relation to SNF placement. CSW emailed [name] the SNF list. [name] review the list. [name] prefers [place], [place], or [place]. CSW answered all questions in which the [place] inquired about. CSW will continue to follow this pt and assist with discharge as needed.   Employment status:  Retired Health and safety inspector:  Medicare PT Recommendations:  Skilled Nursing Facility Information / Referral to community resources:  Skilled Nursing Facility  Patient/Family's Response to care: [name] shared that the medical care provided has been acceptable.    Patient/Family's Understanding of and Emotional Response to Diagnosis, Current Treatment, and Prognosis: [name] acknowledged the pt's current diagnosis.  [name] shared the pt has declined in health. [name] shared that the best care for the pt is SNF.    Emotional Assessment Appearance:   (Unable to Assess ) Attitude/Demeanor/Rapport:  Unable to Assess Affect (typically observed):  Unable to Assess Orientation:  Oriented to Self, Oriented to  Time Alcohol / Substance use:  Not Applicable Psych involvement (Current and /or in the community):  No (Comment)  Discharge Needs  Concerns to be addressed:  Denies Needs/Concerns at this time Readmission within the last 30 days:  No Current discharge risk:  None Barriers to Discharge:  No Barriers Identified   Reyah Streeter, LCSW 01/07/2015, 2:03 PM

## 2015-01-07 NOTE — Clinical Social Work Placement (Signed)
   CLINICAL SOCIAL WORK PLACEMENT  NOTE  Date:  01/07/2015  Patient Details  Name: Christopher Waller MRN: [id number] Date of Birth: May 09, 1924  Clinical Social Work is seeking post-discharge placement for this patient at the Skilled  Nursing Facility level of care (*CSW will initial, date and re-position this form in  chart as items are completed):  Yes   Patient/family provided with [place] Clinical Social Work Department's list of facilities offering this level of care within the geographic area requested by the patient (or if unable, by the patient's family).  Yes   Patient/family informed of their freedom to choose among providers that offer the needed level of care, that participate in Medicare, Medicaid or managed care program needed by the patient, have an available bed and are willing to accept the patient.  Yes   Patient/family informed of [place]'s ownership interest in [facility] and [facility], as well as of the fact that they are under no obligation to receive care at these facilities.  PASRR submitted to EDS on 01/07/15     PASRR number received on 01/07/15     Existing PASRR number confirmed on       FL2 transmitted to all facilities in geographic area requested by pt/family on 01/07/15     FL2 transmitted to all facilities within larger geographic area on       Patient informed that his/her managed care company has contracts with or will negotiate with certain facilities, including the following:        Yes   Patient/family informed of bed offers received.  Patient chooses bed at       Physician recommends and patient chooses bed at      Patient to be transferred to   on  .  Patient to be transferred to facility by       Patient family notified on   of transfer.  Name of family member notified:        PHYSICIAN       Additional Comment:    _______________________________________________ Gwynne Edinger, LCSW 01/07/2015, 3:09 PM

## 2015-01-07 NOTE — Discharge Summary (Addendum)
PATIENT DETAILS Name: Christopher Waller Age: 81 y.o. Sex: male Date of Birth: 1924/06/26 MRN: [id number]. Admitting Physician: [name], MD PCP:No primary care provider on file.  Admit Date: 01/04/2015 Discharge date: 01/08/2015  Recommendations for Outpatient Follow-up:  1. Note-Seroquel discontinued-started on Depakote. 2. Please repeat CBC/BMET in 1 week 3. Please check Depakote levels in the next one week  PRIMARY DISCHARGE DIAGNOSIS:  Active Problems:   Myoclonic jerking   Hypnagogic jerks   Chronic systolic CHF (congestive heart failure) (HCC)   CKD (chronic kidney disease)   Dyslipidemia   Biventricular implantable cardioverter-defibrillator medtronic    Ischemic cardiomyopathy   Acute renal failure (HCC)      PAST MEDICAL HISTORY: Past Medical History  Diagnosis Date  . Coronary artery disease   . Dementia   . Diabetes mellitus without complication (HCC)   . Hyperlipidemia   . Ischemic cardiomyopathy 01/05/2015  . Biventricular implantable cardioverter-defibrillator medtronic  01/05/2015    DISCHARGE MEDICATIONS: Current Discharge Medication List    START taking these medications   Details  divalproex (DEPAKOTE SPRINKLE) 125 MG capsule Take 2 capsules (250 mg total) by mouth every 8 (eight) hours.      CONTINUE these medications which have NOT CHANGED   Details  aspirin 81 MG tablet Take 81 mg by mouth daily with breakfast.     fenofibrate (TRICOR) 145 MG tablet Take 145 mg by mouth at bedtime.     furosemide (LASIX) 40 MG tablet Take 40 mg by mouth daily with breakfast.     glipiZIDE (GLUCOTROL XL) 10 MG 24 hr tablet Take 10 mg by mouth daily with breakfast.    lisinopril (PRINIVIL,ZESTRIL) 10 MG tablet Take 10 mg by mouth at bedtime.     Meclizine HCl (BONINE PO) Take 1 tablet by mouth daily as needed (for dizziness).    metoprolol succinate (TOPROL-XL) 50 MG 24 hr tablet Take 50 mg by mouth daily with breakfast. Take with or immediately  following a meal.    sitaGLIPtin (JANUVIA) 100 MG tablet Take 100 mg by mouth daily with breakfast.       STOP taking these medications     QUEtiapine (SEROQUEL) 50 MG tablet         ALLERGIES:  No Known Allergies  BRIEF HPI:  See H&P, Labs, Consult and Test reports for all details in brief, patient was admitted for evaluation of myoclonic jerks.  CONSULTATIONS:   cardiology and neurology  PERTINENT RADIOLOGIC STUDIES: Ct Head Wo Contrast  01/04/2015   CLINICAL DATA:  Seizure  EXAM: CT HEAD WITHOUT CONTRAST  TECHNIQUE: Contiguous axial images were obtained from the base of the skull through the vertex without intravenous contrast.  COMPARISON:  None.  FINDINGS: Moderate global atrophy. Minimal chronic ischemic changes in the periventricular white matter. No mass effect, midline shift, or acute hemorrhage. Mastoid air cells clear. Small mucous retention cyst in the right maxillary sinus. Nasal septum is somewhat deviated. Sinuses are otherwise clear. Cranium is intact.  IMPRESSION: No acute intracranial pathology.  Chronic changes are noted.   Electronically Signed   By: Jolaine Click M.D.   On: 01/04/2015 11:51   Dg Swallowing Func-speech Pathology  01/06/2015    Objective Swallowing Evaluation:    Patient Details  Name: Christopher Waller MRN: [id number] Date of Birth: 1924/04/27  Today's Date: 01/06/2015 Time: SLP Start Time (ACUTE ONLY): 1120-SLP Stop Time (ACUTE ONLY): 1140 SLP Time Calculation (min) (ACUTE ONLY): 20 min  Past Medical History:  Past Medical History  Diagnosis Date  . Coronary artery disease   . Dementia   . Diabetes mellitus without complication (HCC)   . Hyperlipidemia   . Ischemic cardiomyopathy 01/05/2015  . Biventricular implantable cardioverter-defibrillator medtronic   01/05/2015   Past Surgical History:  Past Surgical History  Procedure Laterality Date  . Pacemaker insertion    . Coronary artery bypass graft     HPI:  Other Pertinent Information: 81 y.o. male with PMH of  CAd/CABG, AICD, DM,  Early Dementia, Hearing loss, was sent to the ER by his nephew who is his  healthcare power of attorney and primary caregiver.  Pt exhibited wet  vocal quality intermittently during BSE with various consistencies; weak  cough elicited and unable to elicit a swallow initially, but pt exhibited  xerostomia and nursing informed SLP he was also a "mouth breather" at  night. Pt failed swallow screen d/t vocal quality being poor; no delayed  or immediate coughing noted with any consistency during BSE; pt exhibits  speech which appears dysarthric and possible velopharyngeal insufficiency  as well. OME was limited, but velar involvement noted. Pt also exhibits  cognitive deficits which impact his swallowing as well as he was noted  frequently to become distracted during intake and exhibited impulsivity as  well, which could impact safety of swallowing.CT head negative.   MBS  recommended with Dysphagia 2/thin diet.    No Data Recorded  Assessment / Plan / Recommendation CHL IP CLINICAL IMPRESSIONS 01/06/2015  Therapy Diagnosis Moderate pharyngeal phase dysphagia;Severe pharyngeal  phase dysphagia;Mild oral phase dysphagia;Other (Comment)  Clinical Impression   Pt presents with mild oral, moderately severe pharyngeal dysphagia with  suspected esophageal component as well.  Decreased oral bolus cohesion  most notably with thin via straw - allowed premature spillage into pharynx  with delayed swallow to pyriform sinus and deep large amount of laryngeal  penetration.  Surprisingly, pt did not aspirate and cleared approx 95% of  penetrates with swallow.  Pharyngeal swallow characterized by sensorimotor  deficits with poor laryngeal elevation/tongue base retraction and  pharyngeal contraction.  Deficits resulted in severe pharyngeal residuals  across consistencies *worse with increased viscocity.  Pt with poor  sensation to said residuals that mixed with secretions but fortunately was  able to "hock" to  clear them consistently t/o MBS!  Also liquid swallows  assist to transit/decrease pharyngeal residuals that mixed with copious  secretions.   Chin tuck attempted and not helpful.   Pt admits to difficultiles with "secretions" in throat during "winter  time".  He is a chronic aspiration risk due to amount of residuals but  with strategies in place, his risk can be minimized.    Suspected decreased CP relaxation due to decreased laryngeal elevation and  pyriform sinus residuals, pt without sensation to residuals, upon  esophageal sweep - appearance of slow clearance distally with mild  retrograde propulsion and ? esophageal spasm/tortuous shape of esophagus-  recommend strict reflux precautions  Pt needs full assist with meals and will require extra time due to his  cognitive deficits and level of dysphagia. Using live video, educated pt  to findings and effective compensation strategies.          CHL IP TREATMENT RECOMMENDATION 01/06/2015  Treatment Recommendations Therapy as outlined in treatment plan below     CHL IP DIET RECOMMENDATION 01/06/2015  SLP Diet Recommendations Dysphagia 3 (Mech soft);Thin  Liquid Administration via NO STRAW  Medication Administration Crushed with puree  Compensations Slow rate;Small sips/bites;Other (Comment), "hock"  and  expectorate t/o meal, follow solids with liquids  Postural Changes and/or Swallow Maneuvers 90* + 30 min after meals      CHL IP OTHER RECOMMENDATIONS 01/06/2015  Recommended Consults (None)  Oral Care Recommendations Oral care before and after PO  Other Recommendations Have oral suction available     No flowsheet data found.   CHL IP FREQUENCY AND DURATION 01/06/2015  Speech Therapy Frequency (ACUTE ONLY) min 2x/week  Treatment Duration 2 weeks         CHL IP REASON FOR REFERRAL 01/06/2015  Reason for Referral Objectively evaluate swallowing function     CHL IP ORAL PHASE 01/06/2015  Lips (None)  Tongue (None)  Mucous membranes (None)  Nutritional status (None)  Other  (None)  Oxygen therapy (None)  Oral Phase Impaired  Oral - Pudding Teaspoon (None)  Oral - Pudding Cup (None)  Oral - Honey Teaspoon (None)  Oral - Honey Cup (None)  Oral - Honey Syringe (None)  Oral - Nectar Teaspoon (None)  Oral - Nectar Cup (None)  Oral - Nectar Straw (None)  Oral - Nectar Syringe (None)  Oral - Ice Chips (None)  Oral - Thin Teaspoon (None)  Oral - Thin Cup (None)  Oral - Thin Straw (None)  Oral - Thin Syringe (None)  Oral - Puree (None)  Oral - Mechanical Soft (None)  Oral - Regular (None)  Oral - Multi-consistency (None)  Oral - Pill (None)  Oral Phase - Comment (None)      CHL IP PHARYNGEAL PHASE 01/06/2015  Pharyngeal Phase Impaired  Pharyngeal - Pudding Teaspoon (None)  Penetration/Aspiration details (pudding teaspoon) (None)  Pharyngeal - Pudding Cup (None)  Penetration/Aspiration details (pudding cup) (None)  Pharyngeal - Honey Teaspoon (None)  Penetration/Aspiration details (honey teaspoon) (None)  Pharyngeal - Honey Cup (None)  Penetration/Aspiration details (honey cup) (None)  Pharyngeal - Honey Syringe (None)  Penetration/Aspiration details (honey syringe) (None)  Pharyngeal - Nectar Teaspoon (None)  Penetration/Aspiration details (nectar teaspoon) (None)  Pharyngeal - Nectar Cup (None)  Penetration/Aspiration details (nectar cup) (None)  Pharyngeal - Nectar Straw (None)  Penetration/Aspiration details (nectar straw) (None)  Pharyngeal - Nectar Syringe (None)  Penetration/Aspiration details (nectar syringe) (None)  Pharyngeal - Ice Chips (None)  Penetration/Aspiration details (ice chips) (None)  Pharyngeal - Thin Teaspoon (None)  Penetration/Aspiration details (thin teaspoon) (None)  Pharyngeal - Thin Cup (None)  Penetration/Aspiration details (thin cup) (None)  Pharyngeal - Thin Straw (None)  Penetration/Aspiration details (thin straw) (None)  Pharyngeal - Thin Syringe (None)  Penetration/Aspiration details (thin syringe') (None)  Pharyngeal - Puree (None)  Penetration/Aspiration  details (puree) (None)  Pharyngeal - Mechanical Soft (None)  Penetration/Aspiration details (mechanical soft) (None)  Pharyngeal - Regular (None)  Penetration/Aspiration details (regular) (None)  Pharyngeal - Multi-consistency (None)  Penetration/Aspiration details (multi-consistency) (None)  Pharyngeal - Pill (None)  Penetration/Aspiration details (pill) (None)  Pharyngeal Comment liquid swallows assisted with decreasing pharyngeal  residuals of solids/pureed, pt with deep laryngeal penetration with straw  sips of thin - without sensation, chin tuck not helpful to prevent  residuals,  cued "expectoration" "hock" cleared vallecular residuals  consistently!       CHL IP CERVICAL ESOPHAGEAL PHASE 01/06/2015  Cervical Esophageal Phase Impaired  Pudding Teaspoon (None)  Pudding Cup (None)  Honey Teaspoon (None)  Honey Cup (None)  Honey Straw (None)  Nectar Teaspoon Reduced cricopharyngeal relaxation  Nectar Cup Reduced cricopharyngeal relaxation  Nectar Straw (None)  Nectar Sippy Cup (None)  Thin Teaspoon Reduced cricopharyngeal relaxation  Thin Cup  Reduced cricopharyngeal relaxation  Thin Straw Reduced cricopharyngeal relaxation  Thin Sippy Cup (None)  Cervical Esophageal Comment suspected decreased CP relaxation due to  decreased laryngeal elevation and pyriform sinus residuals, pt without  sensation to residuals, upon esophageal sweep - appearance of slow  clearance distally with mild retrograde propulsion and ? esophageal  spasm/tortuous shape of esophagus- recommend strict reflux precautions    No flowsheet data found.        Donavan Burnet, MS [facility] SLP [phone]      PERTINENT LAB RESULTS: CBC:  Recent Labs  01/04/15 1103  WBC 8.2  HGB 13.6  HCT 40.6  PLT 167   CMET CMP     Component Value Date/Time   NA 136 01/07/2015 0558   K 3.6 01/07/2015 0558   CL 101 01/07/2015 0558   CO2 25 01/07/2015 0558   GLUCOSE 171* 01/07/2015 0558   BUN 33* 01/07/2015 0558   CREATININE 1.14 01/07/2015 0558    CALCIUM 8.9 01/07/2015 0558   PROT 6.3* 01/05/2015 0431   ALBUMIN 3.1* 01/05/2015 0431   AST 28 01/05/2015 0431   ALT 15* 01/05/2015 0431   ALKPHOS 31* 01/05/2015 0431   BILITOT 1.3* 01/05/2015 0431   GFRNONAA 55* 01/07/2015 0558   GFRAA >60 01/07/2015 0558    GFR CrCl cannot be calculated (Unknown ideal weight.). No results for input(s): LIPASE, AMYLASE in the last 72 hours.  Recent Labs  01/04/15 1103  CKTOTAL 149   Invalid input(s): POCBNP No results for input(s): DDIMER in the last 72 hours.  Recent Labs  01/04/15 1103  HGBA1C 8.2*   No results for input(s): CHOL, HDL, LDLCALC, TRIG, CHOLHDL, LDLDIRECT in the last 72 hours.  Recent Labs  01/04/15 2012  TSH 2.817    Recent Labs  01/04/15 2012  VITAMINB12 320   Coags:  Recent Labs  01/04/15 1103  INR 1.07   Microbiology: Recent Results (from the past 240 hour(s))  Urine culture     Status: None   Collection Time: 01/04/15  1:20 PM  Result Value Ref Range Status   Specimen Description URINE, CATHETERIZED  Final   Special Requests NONE  Final   Culture   Final    NO GROWTH 1 DAY Performed at [facility]    Report Status 01/05/2015 FINAL  Final     BRIEF HOSPITAL COURSE:  Involuntary myoclonic like movements of unclear etiology: Suspect metabolic etiology-given ARF/seroquel use. Seroquel dose gradually reduced-and subsequently discontinued on discharge. Renal function back to normal.  Neuro recommending trial of Depakote 250 mg TID. CT head/EEG neg. unable to do MRI because of AICD in place. Evaluated by EP-no evidence of AICD discharge. No further workup recommended by neurology, plans are to discharge to SNF when bed available. Significant improvement in myoclonic jerks-very minimal myoclonic activity observed this morning.  AVW:[id number] to pre-renal azotemia. Continue to hold Lisinopril-given frailty/risk for further ARF-will not plan on resuming Lisinopril on discharge. Please reassess  whether or not resumed as an outpatient.  H/o CAD/CABG :although pleasantly confused-appear to be stable. Continue ASA/Toprol  Chronic Systolic CHF with ACID: EF on Echo around 35-40%.Clinically compensated-continue Lasix. See above regarding ACEI  DM-2:A1C 8.2.CBG's stable-continue SSI-resume Glipizide and Januvia on discharge.  Dementia:with mild delirium-continue Depakote  Dysphagia:seen by SLP-recommendations are to continue Dys 3 diet-spoke with nephew [name] over the phone-aware of aspiration risk.  Deconditioning:PT eval completed-recommendation are for SNF.   TODAY-DAY OF DISCHARGE:  Subjective:   Dymir Calcaterra today has no headache,no chest  abdominal pain,no new weakness tingling or numbness  Objective:   Blood pressure 124/69, pulse 72, temperature 97.9 F (36.6 C), temperature source Oral, resp. rate 18, SpO2 100 %. No intake or output data in the 24 hours ending 01/07/15 0949 There were no vitals filed for this visit.  Exam Awake Alert, Oriented *3, No new F.N deficits, Normal affect [place].AT,PERRAL Supple Neck,No JVD, No cervical lymphadenopathy appriciated.  Symmetrical Chest wall movement, Good air movement bilaterally, CTAB RRR,No Gallops,Rubs or new Murmurs, No Parasternal Heave +ve B.Sounds, Abd Soft, Non tender, No organomegaly appriciated, No rebound -guarding or rigidity. No Cyanosis, Clubbing or edema, No new Rash or bruise  DISCHARGE CONDITION: Stable  DISPOSITION: SNF  DISCHARGE INSTRUCTIONS:    Activity:  As tolerated with Full fall precautions use walker/cane & assistance as needed  Get Medicines reviewed and adjusted: Please take all your medications with you for your next visit with your Primary MD  Please request your Primary MD to go over all hospital tests and procedure/radiological results at the follow up, please ask your Primary MD to get all Hospital records sent to his/her office.  If you experience worsening of your admission  symptoms, develop shortness of breath, life threatening emergency, suicidal or homicidal thoughts you must seek medical attention immediately by calling 911 or calling your MD immediately  if symptoms less severe.  You must read complete instructions/literature along with all the possible adverse reactions/side effects for all the Medicines you take and that have been prescribed to you. Take any new Medicines after you have completely understood and accpet all the possible adverse reactions/side effects.   Do not drive when taking Pain medications.   Do not take more than prescribed Pain, Sleep and Anxiety Medications  Special Instructions: If you have smoked or chewed Tobacco  in the last 2 yrs please stop smoking, stop any regular Alcohol  and or any Recreational drug use.  Wear Seat belts while driving.  Please note  You were cared for by a hospitalist during your hospital stay. Once you are discharged, your primary care physician will handle any further medical issues. Please note that NO REFILLS for any discharge medications will be authorized once you are discharged, as it is imperative that you return to your primary care physician (or establish a relationship with a primary care physician if you do not have one) for your aftercare needs so that they can reassess your need for medications and monitor your lab values.   Diet recommendation: Dysphagia 3 diet with thin liquids Fluid restriction 1.5 lit/day Aspiration precautions:yes  CODE STATUS: DO NOT INTUBATE  Discharge Instructions    (HEART FAILURE PATIENTS) Call MD:  Anytime you have any of the following symptoms: 1) 3 pound weight gain in 24 hours or 5 pounds in 1 week 2) shortness of breath, with or without a dry hacking cough 3) swelling in the hands, feet or stomach 4) if you have to sleep on extra pillows at night in order to breathe.    Complete by:  As directed      Diet - low sodium heart healthy    Complete by:  As  directed      Diet Carb Modified    Complete by:  As directed      Increase activity slowly    Complete by:  As directed            Follow-up Information    Schedule an appointment as soon as possible for a  visit with [facility] of doctors for follow up care.   Contact information:   Please use the resources provided to you in emergency room by case manager to assist with doctor for follow up       Follow up with Sherryl Manges, MD. Schedule an appointment as soon as possible for a visit in 3 weeks.   Specialty:  Cardiology   Contact information:   [address] [place] [address] [phone]      Total Time spent on discharge equals  45 minutes.  SignedJeoffrey Massed 01/07/2015 9:49 AM

## 2015-01-07 NOTE — Care Management Important Message (Signed)
Important Message  Patient Details  Name: Christopher Waller MRN: [id number] Date of Birth: 1924-06-29   Medicare Important Message Given:  Yes-second notification given    Orson Aloe 01/07/2015, 2:09 PM

## 2015-01-07 NOTE — Progress Notes (Signed)
Speech Language Pathology Treatment: Dysphagia  Patient Details Name: Christopher Waller MRN: [id number] DOB: March 15, 1925 Today's Date: 01/07/2015 Time: 1000-1010 SLP Time Calculation (min) (ACUTE ONLY): 10 min  Assessment / Plan / Recommendation Clinical Impression  Skilled treatment session focused on addressing dysphagia goals.  SLP facilitated session with discussion regarding results and recommendations of yesterday's swallow study.  Patient initially denied any difficulty, but then blamed most of his difficulty on his cold and required Total assist for recall of information.  Upon administration of Dys. 1 textures and thin liquids via cup trials patient required Total faded to Max assist multimodal cues for utilization of swallow strategies, which resulted in no overt s/s of aspiration or need to "hock."  Patient reported being full and declined further advanced texture trials.  Given ongoing difficulty patient will require 24/7 supervision and need for further patient and family education is also recommended at next level of care; home health therapy versus SNF follow up is recommended.    HPI Other Pertinent Information: 81 y.o. male with PMH of CAd/CABG, AICD, DM, Early Dementia, Hearing loss, was sent to the ER by his nephew who is his healthcare power of attorney and primary caregiver.  Pt exhibited wet vocal quality intermittently during BSE with various consistencies; weak cough elicited and unable to elicit a swallow initially, but pt exhibited xerostomia and nursing informed SLP he was also a "mouth breather" at night. Pt failed swallow screen d/t vocal quality being poor; no delayed or immediate coughing noted with any consistency during BSE; pt exhibits speech which appears dysarthric and possible velopharyngeal insufficiency as well. OME was limited, but velar involvement noted. Pt also exhibits cognitive deficits which impact his swallowing as well as he was noted frequently to become  distracted during intake and exhibited impulsivity as well, which could impact safety of swallowing.CT head negative.   MBS recommended with Dysphagia 2/thin diet.     Pertinent Vitals Pain Assessment: No/denies pain  SLP Plan  Continue with current plan of care    Recommendations Diet recommendations: Dysphagia 2 (fine chop);Thin liquid Liquids provided via: Cup;No straw Medication Administration: Crushed with puree Supervision: Patient able to self feed;Full supervision/cueing for compensatory strategies Compensations: Slow rate;Small sips/bites;Other (Comment) (intermittent dry swallows) Postural Changes and/or Swallow Maneuvers: Seated upright 90 degrees              Oral Care Recommendations: Oral care BID Follow up Recommendations: Home health SLP;24 hour supervision/assistance;Skilled Nursing facility Plan: Continue with current plan of care    GO     Christopher Waller 01/07/2015, 10:26 AM

## 2015-01-08 LAB — GLUCOSE, CAPILLARY
Glucose-Capillary: 157 mg/dL — ABNORMAL HIGH (ref 65–99)
Glucose-Capillary: 219 mg/dL — ABNORMAL HIGH (ref 65–99)

## 2015-01-08 NOTE — Progress Notes (Signed)
Physical Therapy Treatment Patient Details Name: Saint Hank MRN: [id number] DOB: 07/02/1924 Today's Date: 02-06-15    History of Present Illness 81 y.o. male admitted for Involuntary myoclonic like movements of unclear etiology. Hx of CAD/CABG, Chronic Systolic CHF with ACID, dementia, and Stage 3 CKD vs ARF.    PT Comments    Session focused on transfer training and therapeutic activities. Patient tolerated well despite fatigue. Declined ambulation at this time but did tolerate pre gait activities in standing with assist. Will continue to see as inidcated.  Follow Up Recommendations  SNF;Supervision/Assistance - 24 hour     Equipment Recommendations  None recommended by PT    Recommendations for Other Services       Precautions / Restrictions Precautions Precautions: Fall Restrictions Weight Bearing Restrictions: No    Mobility  Bed Mobility Overal bed mobility: Needs Assistance Bed Mobility: Sit to Supine       Sit to supine: Min guard   General bed mobility comments: VCs for positioning   Transfers Overall transfer level: Needs assistance Equipment used: Rolling walker (2 wheeled);1 person hand held assist Transfers: Sit to/from Stand Sit to Stand: Mod assist         General transfer comment: Mod assist for balance, falling towards posterior requiring assist to correct.VC for hand placement. (performed therapeutic part task sit<>stand x5)  Ambulation/Gait             General Gait Details: pt declines, too tired to walk   Stairs            Wheelchair Mobility    Modified Rankin (Stroke Patients Only)       Balance     Sitting balance-Leahy Scale: Fair       Standing balance-Leahy Scale: Poor                      Cognition Arousal/Alertness: Awake/alert Behavior During Therapy: WFL for tasks assessed/performed Overall Cognitive Status: No family/caregiver present to determine baseline cognitive functioning                      Exercises      General Comments General comments (skin integrity, edema, etc.): performed part task transfer training, standing weight shifts and pre gait therapies.      Pertinent Vitals/Pain Pain Assessment: No/denies pain    Home Living                      Prior Function            PT Goals (current goals can now be found in the care plan section) Acute Rehab PT Goals Patient Stated Goal: to go home  PT Goal Formulation: With patient Time For Goal Achievement: 01/20/15 Potential to Achieve Goals: Good Progress towards PT goals: Progressing toward goals    Frequency  Min 3X/week    PT Plan Current plan remains appropriate    Co-evaluation             End of Session Equipment Utilized During Treatment: Gait belt Activity Tolerance: Patient limited by fatigue Patient left: in bed;with call bell/phone within reach;with bed alarm set     Time: 4098-1191 PT Time Calculation (min) (ACUTE ONLY): 17 min  Charges:  $Therapeutic Activity: 8-22 mins                    G CodesFabio Asa 2015-02-06, 11:36 AM Charlotte Crumb,  PT DPT  [phone]

## 2015-01-08 NOTE — Progress Notes (Signed)
Offered patient bath/shower but patient refused. Awaiting for transport to SNF.   Sim Boast, RN

## 2015-01-08 NOTE — Progress Notes (Signed)
PATIENT DETAILS Name: Christopher Waller Age: 81 y.o. Sex: male Date of Birth: 09-17-1924 Admit Date: 01/04/2015 Admitting Physician [name], MD PCP:No primary care provider on file.  Subjective: No complaints this am-did not observe any myoclonic jerks during my rounds  Assessment/Plan: Active Problems: Involuntary myoclonic like movements of unclear etiology: Suspect metabolic etiology-given ARF/seroquel use. Seroquel discontinued.Renal function improving. Neuro recommending trial of Depakote 250 mg TID. CT head/EEG neg. Evaluated by EP-no evidence of AICD discharge. Suspect requires no further work up, plans are to discharge to SNF today  WUJ:[id number] to pre-renal azotemia. Continue to hold Lisinopril-given frailty/risk for further ARF-will not plan on resuming Lisinopril on discharge.   H/o CAD/CABG :although pleasantly confused-appear to be stable. Continue ASA/Toprol  Chronic Systolic CHF with ACID: EF on Echo around 35-40%.Clinically compensated-continue Lasix. See above regarding ACEI  DM-2:A1C 8.2.CBG's stable-continue SSI-resume Glipizide on discharge.  Dementia:with mild delirium-continue Seroquel.  Dysphagia:seen by SLP-recommendations are to continue Dys 3 diet-spoke with nephew [name] over the phone-aware of aspiration risk.  Deconditioning:PT eval completed-recommendation are for SNF.   Disposition: Remain inpatient-SNF 10/11  Antimicrobial agents  See below  Anti-infectives    None      DVT Prophylaxis: Prophylactic Lovenox   Code Status: DNI-spoke wit [name] (nephew) over the phone-no intubation-but ok with CPR/Cardioversion.   Family Communication Nephew-[name]-over the phone  Procedures: None  CONSULTS:  cardiology and neurology  Time spent 25 minutes-Greater than 50% of this time was spent in counseling, explanation of diagnosis, planning of further management, and coordination of care.  MEDICATIONS: Scheduled  Meds: . aspirin EC  81 mg Oral Daily  . divalproex  250 mg Oral 3 times per day  . enoxaparin (LOVENOX) injection  30 mg Subcutaneous Q24H  . fenofibrate  160 mg Oral Daily  . furosemide  40 mg Oral Q breakfast  . insulin aspart  0-9 Units Subcutaneous TID WC  . metoprolol succinate  50 mg Oral Daily   Continuous Infusions:   PRN Meds:.acetaminophen **OR** acetaminophen, ondansetron **OR** ondansetron (ZOFRAN) IV    PHYSICAL EXAM: Vital signs in last 24 hours: Filed Vitals:   01/08/15 0215 01/08/15 0545 01/08/15 0815 01/08/15 1033  BP: 113/59 102/58 99/73 94/54   Pulse: 77 76 95 74  Temp: 98.2 F (36.8 C) 98.2 F (36.8 C) 97.7 F (36.5 C) 98.7 F (37.1 C)  TempSrc: Oral Oral Oral Oral  Resp: Height:      Weight:      SpO2: 100% 100% 97% 98%    Weight change:  Filed Weights   01/07/15 1200  Weight: 63.957 kg (141 lb)   Body mass index is 22.77 kg/(m^2).   Gen Exam: Awake, mostly alert with clear speech.  Neck: Supple, No JVD.   Chest: B/L Clear.   CVS: S1 S2 Regular, no murmurs.  Abdomen: soft, BS +, non tender, non distended.  Extremities: no edema, lower extremities warm to touch. Neurologic: Non Focal.  No myoclonic jerks seen. Skin: No Rash.   Wounds: N/A.   Intake/Output from previous day:  Intake/Output Summary (Last 24 hours) at 01/08/15 1046 Last data filed at 01/08/15 0815  Gross per 24 hour  Intake    840 ml  Output      0 ml  Net    840 ml     LAB RESULTS: CBC  Recent Labs Lab 01/04/15 1103  WBC 8.2  HGB  13.6  HCT 40.6  PLT 167  MCV 89.0  MCH 29.8  MCHC 33.5  RDW 14.1  LYMPHSABS 1.2  MONOABS 0.5  EOSABS 0.1  BASOSABS 0.0    Chemistries   Recent Labs Lab 01/04/15 1103 01/05/15 0431 01/06/15 0702 01/07/15 0558  NA 135 143 141 136  K 4.7 4.3 3.6 3.6  CL 104 108 102 101  CO2 19* 25 27 25   GLUCOSE 157* 105* 114* 171*  BUN 68* 51* 33* 33*  CREATININE 1.95* 1.72* 1.40* 1.14  CALCIUM 9.2 9.4 9.4 8.9  MG 2.4   --   --   --     CBG:  Recent Labs Lab 01/07/15 0625 01/07/15 1156 01/07/15 1717 01/07/15 2207 01/08/15 0638  GLUCAP 163* 225* 151* 219* 157*    GFR Estimated Creatinine Clearance: 40.4 mL/min (by C-G formula based on Cr of 1.14).  Coagulation profile  Recent Labs Lab 01/04/15 1103  INR 1.07    Cardiac Enzymes No results for input(s): CKMB, TROPONINI, MYOGLOBIN in the last 168 hours.  Invalid input(s): CK  Invalid input(s): POCBNP No results for input(s): DDIMER in the last 72 hours. No results for input(s): HGBA1C in the last 72 hours. No results for input(s): CHOL, HDL, LDLCALC, TRIG, CHOLHDL, LDLDIRECT in the last 72 hours. No results for input(s): TSH, T4TOTAL, T3FREE, THYROIDAB in the last 72 hours.  Invalid input(s): FREET3 No results for input(s): VITAMINB12, FOLATE, FERRITIN, TIBC, IRON, RETICCTPCT in the last 72 hours. No results for input(s): LIPASE, AMYLASE in the last 72 hours.  Urine Studies No results for input(s): UHGB, CRYS in the last 72 hours.  Invalid input(s): UACOL, UAPR, USPG, UPH, UTP, UGL, UKET, UBIL, UNIT, UROB, ULEU, UEPI, UWBC, URBC, UBAC, CAST, UCOM, BILUA  MICROBIOLOGY: Recent Results (from the past 240 hour(s))  Urine culture     Status: None   Collection Time: 01/04/15  1:20 PM  Result Value Ref Range Status   Specimen Description URINE, CATHETERIZED  Final   Special Requests NONE  Final   Culture   Final    NO GROWTH 1 DAY Performed at [facility]    Report Status 01/05/2015 FINAL  Final    RADIOLOGY STUDIES/RESULTS: Ct Head Wo Contrast  01/04/2015   CLINICAL DATA:  Seizure  EXAM: CT HEAD WITHOUT CONTRAST  TECHNIQUE: Contiguous axial images were obtained from the base of the skull through the vertex without intravenous contrast.  COMPARISON:  None.  FINDINGS: Moderate global atrophy. Minimal chronic ischemic changes in the periventricular white matter. No mass effect, midline shift, or acute hemorrhage. Mastoid air  cells clear. Small mucous retention cyst in the right maxillary sinus. Nasal septum is somewhat deviated. Sinuses are otherwise clear. Cranium is intact.  IMPRESSION: No acute intracranial pathology.  Chronic changes are noted.   Electronically Signed   By: Jolaine Click M.D.   On: 01/04/2015 11:51   Dg Swallowing Func-speech Pathology  01/06/2015    Objective Swallowing Evaluation:    Patient Details  Name: Skeeter Sheard MRN: [id number] Date of Birth: 1925/02/15  Today's Date: 01/06/2015 Time: SLP Start Time (ACUTE ONLY): 1120-SLP Stop Time (ACUTE ONLY): 1140 SLP Time Calculation (min) (ACUTE ONLY): 20 min  Past Medical History:  Past Medical History  Diagnosis Date  . Coronary artery disease   . Dementia   . Diabetes mellitus without complication (HCC)   . Hyperlipidemia   . Ischemic cardiomyopathy 01/05/2015  . Biventricular implantable cardioverter-defibrillator medtronic   01/05/2015   Past Surgical  History:  Past Surgical History  Procedure Laterality Date  . Pacemaker insertion    . Coronary artery bypass graft     HPI:  Other Pertinent Information: 81 y.o. male with PMH of CAd/CABG, AICD, DM,  Early Dementia, Hearing loss, was sent to the ER by his nephew who is his  healthcare power of attorney and primary caregiver.  Pt exhibited wet  vocal quality intermittently during BSE with various consistencies; weak  cough elicited and unable to elicit a swallow initially, but pt exhibited  xerostomia and nursing informed SLP he was also a "mouth breather" at  night. Pt failed swallow screen d/t vocal quality being poor; no delayed  or immediate coughing noted with any consistency during BSE; pt exhibits  speech which appears dysarthric and possible velopharyngeal insufficiency  as well. OME was limited, but velar involvement noted. Pt also exhibits  cognitive deficits which impact his swallowing as well as he was noted  frequently to become distracted during intake and exhibited impulsivity as  well, which could  impact safety of swallowing.CT head negative.   MBS  recommended with Dysphagia 2/thin diet.    No Data Recorded  Assessment / Plan / Recommendation CHL IP CLINICAL IMPRESSIONS 01/06/2015  Therapy Diagnosis Moderate pharyngeal phase dysphagia;Severe pharyngeal  phase dysphagia;Mild oral phase dysphagia;Other (Comment)  Clinical Impression   Pt presents with mild oral, moderately severe pharyngeal dysphagia with  suspected esophageal component as well.  Decreased oral bolus cohesion  most notably with thin via straw - allowed premature spillage into pharynx  with delayed swallow to pyriform sinus and deep large amount of laryngeal  penetration.  Surprisingly, pt did not aspirate and cleared approx 95% of  penetrates with swallow.  Pharyngeal swallow characterized by sensorimotor  deficits with poor laryngeal elevation/tongue base retraction and  pharyngeal contraction.  Deficits resulted in severe pharyngeal residuals  across consistencies *worse with increased viscocity.  Pt with poor  sensation to said residuals that mixed with secretions but fortunately was  able to "hock" to clear them consistently t/o MBS!  Also liquid swallows  assist to transit/decrease pharyngeal residuals that mixed with copious  secretions.   Chin tuck attempted and not helpful.   Pt admits to difficultiles with "secretions" in throat during "winter  time".  He is a chronic aspiration risk due to amount of residuals but  with strategies in place, his risk can be minimized.    Suspected decreased CP relaxation due to decreased laryngeal elevation and  pyriform sinus residuals, pt without sensation to residuals, upon  esophageal sweep - appearance of slow clearance distally with mild  retrograde propulsion and ? esophageal spasm/tortuous shape of esophagus-  recommend strict reflux precautions  Pt needs full assist with meals and will require extra time due to his  cognitive deficits and level of dysphagia. Using live video, educated pt  to  findings and effective compensation strategies.          CHL IP TREATMENT RECOMMENDATION 01/06/2015  Treatment Recommendations Therapy as outlined in treatment plan below     CHL IP DIET RECOMMENDATION 01/06/2015  SLP Diet Recommendations Dysphagia 3 (Mech soft);Thin  Liquid Administration via NO STRAW  Medication Administration Crushed with puree  Compensations Slow rate;Small sips/bites;Other (Comment), "hock" and  expectorate t/o meal, follow solids with liquids  Postural Changes and/or Swallow Maneuvers 90* + 30 min after meals      CHL IP OTHER RECOMMENDATIONS 01/06/2015  Recommended Consults (None)  Oral Care Recommendations Oral care before  and after PO  Other Recommendations Have oral suction available     No flowsheet data found.   CHL IP FREQUENCY AND DURATION 01/06/2015  Speech Therapy Frequency (ACUTE ONLY) min 2x/week  Treatment Duration 2 weeks         CHL IP REASON FOR REFERRAL 01/06/2015  Reason for Referral Objectively evaluate swallowing function     CHL IP ORAL PHASE 01/06/2015  Lips (None)  Tongue (None)  Mucous membranes (None)  Nutritional status (None)  Other (None)  Oxygen therapy (None)  Oral Phase Impaired  Oral - Pudding Teaspoon (None)  Oral - Pudding Cup (None)  Oral - Honey Teaspoon (None)  Oral - Honey Cup (None)  Oral - Honey Syringe (None)  Oral - Nectar Teaspoon (None)  Oral - Nectar Cup (None)  Oral - Nectar Straw (None)  Oral - Nectar Syringe (None)  Oral - Ice Chips (None)  Oral - Thin Teaspoon (None)  Oral - Thin Cup (None)  Oral - Thin Straw (None)  Oral - Thin Syringe (None)  Oral - Puree (None)  Oral - Mechanical Soft (None)  Oral - Regular (None)  Oral - Multi-consistency (None)  Oral - Pill (None)  Oral Phase - Comment (None)      CHL IP PHARYNGEAL PHASE 01/06/2015  Pharyngeal Phase Impaired  Pharyngeal - Pudding Teaspoon (None)  Penetration/Aspiration details (pudding teaspoon) (None)  Pharyngeal - Pudding Cup (None)  Penetration/Aspiration details (pudding cup) (None)   Pharyngeal - Honey Teaspoon (None)  Penetration/Aspiration details (honey teaspoon) (None)  Pharyngeal - Honey Cup (None)  Penetration/Aspiration details (honey cup) (None)  Pharyngeal - Honey Syringe (None)  Penetration/Aspiration details (honey syringe) (None)  Pharyngeal - Nectar Teaspoon (None)  Penetration/Aspiration details (nectar teaspoon) (None)  Pharyngeal - Nectar Cup (None)  Penetration/Aspiration details (nectar cup) (None)  Pharyngeal - Nectar Straw (None)  Penetration/Aspiration details (nectar straw) (None)  Pharyngeal - Nectar Syringe (None)  Penetration/Aspiration details (nectar syringe) (None)  Pharyngeal - Ice Chips (None)  Penetration/Aspiration details (ice chips) (None)  Pharyngeal - Thin Teaspoon (None)  Penetration/Aspiration details (thin teaspoon) (None)  Pharyngeal - Thin Cup (None)  Penetration/Aspiration details (thin cup) (None)  Pharyngeal - Thin Straw (None)  Penetration/Aspiration details (thin straw) (None)  Pharyngeal - Thin Syringe (None)  Penetration/Aspiration details (thin syringe') (None)  Pharyngeal - Puree (None)  Penetration/Aspiration details (puree) (None)  Pharyngeal - Mechanical Soft (None)  Penetration/Aspiration details (mechanical soft) (None)  Pharyngeal - Regular (None)  Penetration/Aspiration details (regular) (None)  Pharyngeal - Multi-consistency (None)  Penetration/Aspiration details (multi-consistency) (None)  Pharyngeal - Pill (None)  Penetration/Aspiration details (pill) (None)  Pharyngeal Comment liquid swallows assisted with decreasing pharyngeal  residuals of solids/pureed, pt with deep laryngeal penetration with straw  sips of thin - without sensation, chin tuck not helpful to prevent  residuals,  cued "expectoration" "hock" cleared vallecular residuals  consistently!       CHL IP CERVICAL ESOPHAGEAL PHASE 01/06/2015  Cervical Esophageal Phase Impaired  Pudding Teaspoon (None)  Pudding Cup (None)  Honey Teaspoon (None)  Honey Cup (None)  Honey Straw  (None)  Nectar Teaspoon Reduced cricopharyngeal relaxation  Nectar Cup Reduced cricopharyngeal relaxation  Nectar Straw (None)  Nectar Sippy Cup (None)  Thin Teaspoon Reduced cricopharyngeal relaxation  Thin Cup Reduced cricopharyngeal relaxation  Thin Straw Reduced cricopharyngeal relaxation  Thin Sippy Cup (None)  Cervical Esophageal Comment suspected decreased CP relaxation due to  decreased laryngeal elevation and pyriform sinus residuals, pt without  sensation to residuals, upon esophageal sweep -  appearance of slow  clearance distally with mild retrograde propulsion and ? esophageal  spasm/tortuous shape of esophagus- recommend strict reflux precautions    No flowsheet data found.        Donavan Burnet, MS [facility] SLP [phone]     Jeoffrey Massed, MD  [facility] Pager:[phone]  If 7PM-7AM, please contact night-coverage [email or web address] Password [email or web address] 01/08/2015, 10:46 AM   LOS: 4 days

## 2015-01-08 NOTE — Clinical Social Work Placement (Signed)
   CLINICAL SOCIAL WORK PLACEMENT  NOTE  Date:  01/08/2015  Patient Details  Name: Christopher Waller MRN: [id number] Date of Birth: 12-Oct-1924  Clinical Social Work is seeking post-discharge placement for this patient at the Skilled  Nursing Facility level of care (*CSW will initial, date and re-position this form in  chart as items are completed):  Yes   Patient/family provided with [place] Clinical Social Work Department's list of facilities offering this level of care within the geographic area requested by the patient (or if unable, by the patient's family).  Yes   Patient/family informed of their freedom to choose among providers that offer the needed level of care, that participate in Medicare, Medicaid or managed care program needed by the patient, have an available bed and are willing to accept the patient.  Yes   Patient/family informed of 's ownership interest in [facility] and [facility], as well as of the fact that they are under no obligation to receive care at these facilities.  PASRR submitted to EDS on 01/07/15     PASRR number received on 01/07/15     Existing PASRR number confirmed on       FL2 transmitted to all facilities in geographic area requested by pt/family on 01/07/15     FL2 transmitted to all facilities within larger geographic area on       Patient informed that his/her managed care company has contracts with or will negotiate with certain facilities, including the following:        Yes   Patient/family informed of bed offers received.  [facility]     Physician recommends and patient chooses bed at      Patient to be transferred to [facility] on 01/08/15.  Patient to be transferred to facility by [facility]      Patient family notified on 01/08/15 of transfer.  Name of family member notified:  Pt's [name], nephew      PHYSICIAN       Additional Comment:     _______________________________________________ Gwynne Edinger, LCSW 01/08/2015, 10:09 AM

## 2015-01-08 NOTE — Progress Notes (Signed)
Report called to nurse at [facility].  Lelon Mast, RN

## 2015-01-23 ENCOUNTER — Ambulatory Visit (INDEPENDENT_AMBULATORY_CARE_PROVIDER_SITE_OTHER): Payer: Medicare Other | Admitting: Nurse Practitioner

## 2015-01-23 ENCOUNTER — Encounter: Payer: Self-pay | Admitting: Internal Medicine

## 2015-01-23 ENCOUNTER — Encounter: Payer: Self-pay | Admitting: Nurse Practitioner

## 2015-01-23 VITALS — BP 118/58 | HR 72 | Wt 134.0 lb

## 2015-01-23 DIAGNOSIS — I5022 Chronic systolic (congestive) heart failure: Secondary | ICD-10-CM | POA: Diagnosis not present

## 2015-01-23 DIAGNOSIS — I484 Atypical atrial flutter: Secondary | ICD-10-CM

## 2015-01-23 NOTE — Patient Instructions (Signed)
Medication Instructions:   Your physician recommends that you continue on your current medications as directed. Please refer to the Current Medication list given to you today.   Labwork:  NONE ORDER TODAY    Testing/Procedures: NONE ORDER TODAY    Follow-Up:   IN 3 MONTHS WITH DR Graciela HusbandsKLEIN   Any Other Special Instructions Will Be Listed Below (If Applicable).

## 2015-01-23 NOTE — Progress Notes (Signed)
Electrophysiology Office Note Date: 01/23/2015  ID:  Christopher Waller, DOB [birth year], MRN [id number]  PCP: No primary care provider on file. Electrophysiologist: Christopher Waller  CC: ICD follow-up  Christopher Waller is a 81 y.o. male seen today for Christopher Christopher Waller.  He underwent ICD implantation originally in 2010 presumable for ICM and congestive heart failure (records not available at this time).  The patient is seen alone today and has dementia.  He is not able to provide history or review of symptoms.  He was admitted earlier this month with myoclonic jerking and EP was asked to evaluate for ICD function.  His devie was interrogated and found to be functioning normally without ICD therapy or diaphragmatic stim.     Device History: MDT CRTD implanted 2010 for ICM, congestive heart failure; gen change 2014 History of appropriate therapy: not on current device History of AAD therapy: unknown   Past Medical History  Diagnosis Date  . Coronary artery disease   . Dementia   . Diabetes mellitus without complication (HCC)   . Hyperlipidemia   . Ischemic cardiomyopathy 01/05/2015  . Biventricular implantable cardioverter-defibrillator medtronic  01/05/2015   Past Surgical History  Procedure Laterality Date  . Pacemaker insertion    . Coronary artery bypass graft      Current Outpatient Prescriptions  Medication Sig Dispense Refill  . aspirin 81 MG tablet Take 81 mg by mouth daily with breakfast.     . divalproex (DEPAKOTE SPRINKLE) 125 MG capsule Take 2 capsules (250 mg total) by mouth every 8 (eight) hours.    . fenofibrate (TRICOR) 145 MG tablet Take 145 mg by mouth at bedtime.     . furosemide (LASIX) 40 MG tablet Take 40 mg by mouth daily with breakfast.     . glipiZIDE (GLUCOTROL XL) 10 MG 24 hr tablet Take 10 mg by mouth daily with breakfast.    . guaiFENesin (ROBITUSSIN) 100 MG/5ML liquid Take 200 mg by mouth 3 (three) times daily as needed for cough.    Marland Kitchen. lisinopril (PRINIVIL,ZESTRIL) 10  MG tablet Take 10 mg by mouth daily.    . Meclizine HCl (BONINE PO) Take 1 tablet by mouth daily as needed (for dizziness).    . metoprolol succinate (TOPROL-XL) 50 MG 24 hr tablet Take 50 mg by mouth daily with breakfast. Take with or immediately following a meal.    . sitaGLIPtin (JANUVIA) 100 MG tablet Take 100 mg by mouth daily with breakfast.      No current facility-administered medications for this visit.    Allergies:   Review of patient's allergies indicates no known allergies.   Social History: Social History   Social History  . Marital Status: Unknown    Spouse Name: N/A  . Number of Children: N/A  . Years of Education: N/A   Occupational History  . Not on file.   Social History Main Topics  . Smoking status: Former Smoker    Quit date: 03/30/1964  . Smokeless tobacco: Not on file  . Alcohol Use: Not on file  . Drug Use: Not on file  . Sexual Activity: Not Currently   Other Topics Concern  . Not on file   Social History Narrative   Review of Systems: Pt unable to provide   Physical Exam: VS:  BP 118/58 mmHg  Pulse 72  Wt 134 lb (60.782 kg)  SpO2 66% , BMI Body mass index is 21.64 kg/(m^2).  GEN- The patient is elderly appearing, oriented to person  HEENT: normocephalic, atraumatic; sclera clear, conjunctiva pink; hearing intact; oropharynx clear; neck supple  Lungs- Clear to ausculation bilaterally, normal work of breathing.  No wheezes, rales, rhonchi Heart- Regular rate and rhythm (paced) GI- soft, non-tender, non-distended, bowel sounds present  Extremities- no clubbing, cyanosis, or edema; DP/PT/radial pulses 2+ bilaterally MS- no significant deformity or atrophy Skin- warm and dry, no rash or lesion; ICD pocket well healed Psych- euthymic mood, full affect Neuro- strength and sensation are intact  ICD interrogation- reviewed in detail today,  See PACEART report  EKG:  EKG is ordered today. The ekg ordered today shows atrial flutter with  ventricular pacing  Recent Labs: 01/04/2015: Hemoglobin 13.6; Magnesium 2.4; Platelets 167; TSH 2.817 01/05/2015: ALT 15* 01/07/2015: BUN 33*; Creatinine, Ser 1.14; Potassium 3.6; Sodium 136   Wt Readings from Last 3 Encounters:  01/23/15 134 lb (60.782 kg)  01/07/15 141 lb (63.957 kg)     Other studies Reviewed: Additional studies/ records that were reviewed today include: Christopher Waller consult note in hospital  Assessment and Plan:  1.  Chronic systolic dysfunction euvolemic today Stable on an appropriate medical regimen Normal ICD function See Christopher Waller report With advanced age and dementia, disabling tachy therapies is likely the most appropriate thing. I have changed VF zone from 188 to 200bpm today and extended NID to 30/40. Will leave programmed on for now until seen again in follow up by Christopher Christopher Husbands.   2. Atrial flutter/persistent atrial fibrillation The patient has a longstanding history of atrial arrhythmias by device interrogation but is not currently anticoagulated presumably 2/2 falls V rates controlled with underlying complete heart block   Current medicines are reviewed at length with the patient today.   The patient does not have concerns regarding his medicines.  The following changes were made today:  none  Labs/ tests ordered today include: none   Disposition:   Follow up with Christopher Christopher Husbands in 3 months   Signed, Christopher Balsam, NP 01/23/2015 1:43 PM  [facility] [address] [place] [address] [phone] (office) [phone] (fax)

## 2015-04-10 ENCOUNTER — Encounter (HOSPITAL_COMMUNITY): Payer: Self-pay | Admitting: Emergency Medicine

## 2015-04-10 ENCOUNTER — Inpatient Hospital Stay (HOSPITAL_COMMUNITY)
Admission: EM | Admit: 2015-04-10 | Discharge: 2015-04-15 | DRG: 638 | Disposition: A | Discharge status: Skilled Nursing Facility | Payer: Medicare Other | Attending: Internal Medicine | Admitting: Internal Medicine

## 2015-04-10 ENCOUNTER — Emergency Department (HOSPITAL_COMMUNITY): Payer: Medicare Other

## 2015-04-10 DIAGNOSIS — R404 Transient alteration of awareness: Secondary | ICD-10-CM

## 2015-04-10 DIAGNOSIS — E785 Hyperlipidemia, unspecified: Secondary | ICD-10-CM | POA: Diagnosis present

## 2015-04-10 DIAGNOSIS — J189 Pneumonia, unspecified organism: Secondary | ICD-10-CM

## 2015-04-10 DIAGNOSIS — Z8679 Personal history of other diseases of the circulatory system: Secondary | ICD-10-CM | POA: Diagnosis present

## 2015-04-10 DIAGNOSIS — I350 Nonrheumatic aortic (valve) stenosis: Secondary | ICD-10-CM

## 2015-04-10 DIAGNOSIS — Z95810 Presence of automatic (implantable) cardiac defibrillator: Secondary | ICD-10-CM | POA: Diagnosis present

## 2015-04-10 DIAGNOSIS — N179 Acute kidney failure, unspecified: Secondary | ICD-10-CM

## 2015-04-10 DIAGNOSIS — E1122 Type 2 diabetes mellitus with diabetic chronic kidney disease: Secondary | ICD-10-CM | POA: Diagnosis present

## 2015-04-10 DIAGNOSIS — Z23 Encounter for immunization: Secondary | ICD-10-CM

## 2015-04-10 DIAGNOSIS — L89312 Pressure ulcer of right buttock, stage 2: Secondary | ICD-10-CM | POA: Diagnosis present

## 2015-04-10 DIAGNOSIS — I2510 Atherosclerotic heart disease of native coronary artery without angina pectoris: Secondary | ICD-10-CM | POA: Diagnosis present

## 2015-04-10 DIAGNOSIS — E11649 Type 2 diabetes mellitus with hypoglycemia without coma: Principal | ICD-10-CM

## 2015-04-10 DIAGNOSIS — F0390 Unspecified dementia without behavioral disturbance: Secondary | ICD-10-CM | POA: Diagnosis present

## 2015-04-10 DIAGNOSIS — I959 Hypotension, unspecified: Secondary | ICD-10-CM

## 2015-04-10 DIAGNOSIS — I5022 Chronic systolic (congestive) heart failure: Secondary | ICD-10-CM | POA: Diagnosis present

## 2015-04-10 DIAGNOSIS — L8990 Pressure ulcer of unspecified site, unspecified stage: Secondary | ICD-10-CM | POA: Insufficient documentation

## 2015-04-10 DIAGNOSIS — R251 Tremor, unspecified: Secondary | ICD-10-CM | POA: Diagnosis not present

## 2015-04-10 DIAGNOSIS — N189 Chronic kidney disease, unspecified: Secondary | ICD-10-CM | POA: Diagnosis present

## 2015-04-10 DIAGNOSIS — I255 Ischemic cardiomyopathy: Secondary | ICD-10-CM | POA: Diagnosis present

## 2015-04-10 DIAGNOSIS — Z951 Presence of aortocoronary bypass graft: Secondary | ICD-10-CM

## 2015-04-10 DIAGNOSIS — Z87891 Personal history of nicotine dependence: Secondary | ICD-10-CM

## 2015-04-10 DIAGNOSIS — I272 Other secondary pulmonary hypertension: Secondary | ICD-10-CM | POA: Diagnosis present

## 2015-04-10 DIAGNOSIS — Z7982 Long term (current) use of aspirin: Secondary | ICD-10-CM

## 2015-04-10 DIAGNOSIS — Z7984 Long term (current) use of oral hypoglycemic drugs: Secondary | ICD-10-CM

## 2015-04-10 DIAGNOSIS — R55 Syncope and collapse: Secondary | ICD-10-CM | POA: Diagnosis present

## 2015-04-10 DIAGNOSIS — E860 Dehydration: Secondary | ICD-10-CM | POA: Diagnosis present

## 2015-04-10 DIAGNOSIS — E162 Hypoglycemia, unspecified: Secondary | ICD-10-CM | POA: Diagnosis present

## 2015-04-10 LAB — CBC WITH DIFFERENTIAL/PLATELET
BASOS ABS: 0 10*3/uL (ref 0.0–0.1)
Basophils Relative: 0 %
Eosinophils Absolute: 0.1 10*3/uL (ref 0.0–0.7)
Eosinophils Relative: 2 %
HEMATOCRIT: 37.2 % — AB (ref 39.0–52.0)
Hemoglobin: 12.1 g/dL — ABNORMAL LOW (ref 13.0–17.0)
LYMPHS ABS: 1.2 10*3/uL (ref 0.7–4.0)
LYMPHS PCT: 21 %
MCH: 31.4 pg (ref 26.0–34.0)
MCHC: 32.5 g/dL (ref 30.0–36.0)
MCV: 96.6 fL (ref 78.0–100.0)
MONO ABS: 0.6 10*3/uL (ref 0.1–1.0)
MONOS PCT: 11 %
NEUTROS ABS: 3.7 10*3/uL (ref 1.7–7.7)
Neutrophils Relative %: 66 %
Platelets: 142 10*3/uL — ABNORMAL LOW (ref 150–400)
RBC: 3.85 MIL/uL — ABNORMAL LOW (ref 4.22–5.81)
RDW: 13.8 % (ref 11.5–15.5)
WBC: 5.6 10*3/uL (ref 4.0–10.5)

## 2015-04-10 LAB — URINE MICROSCOPIC-ADD ON

## 2015-04-10 LAB — COMPREHENSIVE METABOLIC PANEL
ALBUMIN: 3.1 g/dL — AB (ref 3.5–5.0)
ALT: 22 U/L (ref 17–63)
ANION GAP: 14 (ref 5–15)
AST: 38 U/L (ref 15–41)
Alkaline Phosphatase: 38 U/L (ref 38–126)
BUN: 60 mg/dL — ABNORMAL HIGH (ref 6–20)
CHLORIDE: 103 mmol/L (ref 101–111)
CO2: 24 mmol/L (ref 22–32)
Calcium: 9 mg/dL (ref 8.9–10.3)
Creatinine, Ser: 1.75 mg/dL — ABNORMAL HIGH (ref 0.61–1.24)
GFR calc Af Amer: 38 mL/min — ABNORMAL LOW (ref >60)
GFR calc non Af Amer: 33 mL/min — ABNORMAL LOW (ref >60)
GLUCOSE: 39 mg/dL — AB (ref 65–99)
POTASSIUM: 4.3 mmol/L (ref 3.5–5.1)
SODIUM: 141 mmol/L (ref 135–145)
TOTAL PROTEIN: 6.3 g/dL — AB (ref 6.5–8.1)
Total Bilirubin: 0.5 mg/dL (ref 0.3–1.2)

## 2015-04-10 LAB — URINALYSIS, ROUTINE W REFLEX MICROSCOPIC
Bilirubin Urine: NEGATIVE
Glucose, UA: NEGATIVE mg/dL
Ketones, ur: NEGATIVE mg/dL
Leukocytes, UA: NEGATIVE
Nitrite: NEGATIVE
Protein, ur: NEGATIVE mg/dL
SPECIFIC GRAVITY, URINE: 1.011 (ref 1.005–1.030)
pH: 5 (ref 5.0–8.0)

## 2015-04-10 LAB — AMMONIA: AMMONIA: 20 umol/L (ref 9–35)

## 2015-04-10 LAB — CBG MONITORING, ED
GLUCOSE-CAPILLARY: 29 mg/dL — AB (ref 65–99)
GLUCOSE-CAPILLARY: 172 mg/dL — AB (ref 65–99)
Glucose-Capillary: 160 mg/dL — ABNORMAL HIGH (ref 65–99)
Glucose-Capillary: 31 mg/dL — CL (ref 65–99)

## 2015-04-10 LAB — VALPROIC ACID LEVEL: VALPROIC ACID LVL: 18 ug/mL — AB (ref 50.0–100.0)

## 2015-04-10 LAB — I-STAT TROPONIN, ED: Troponin i, poc: 0 ng/mL (ref 0.00–0.08)

## 2015-04-10 MED ORDER — DEXTROSE 50 % IV SOLN
1.0000 | Freq: Once | INTRAVENOUS | Status: AC
  Administered 2015-04-10: 50 mL via INTRAVENOUS
  Filled 2015-04-10: qty 50

## 2015-04-10 MED ORDER — SODIUM CHLORIDE 0.9 % IV SOLN
INTRAVENOUS | Status: DC
  Administered 2015-04-10: via INTRAVENOUS

## 2015-04-10 MED ORDER — SODIUM CHLORIDE 0.9 % IV BOLUS (SEPSIS)
500.0000 mL | Freq: Once | INTRAVENOUS | Status: AC
  Administered 2015-04-10: 500 mL via INTRAVENOUS

## 2015-04-10 NOTE — ED Notes (Signed)
CBG was 172.  

## 2015-04-10 NOTE — ED Notes (Signed)
While attempting to in and out cath pt, this RN noted that he was wearing 3 adult diapers that were all soaked through. Pt lives at home with his nephew. Mild skin tears noted on pt's scrotum.

## 2015-04-10 NOTE — ED Provider Notes (Signed)
Face-to-face evaluation   History: Patient here for a short period of trembling, and decreased communication. A similar episode several months ago.  Physical exam: Alert, elderly man with mild intermittent tremor. Heart regular rate and rhythm, no murmur. Lungs clear anteriorly. Abdomen soft, nontender.   Medications  0.9 %  sodium chloride infusion ( Intravenous New Bag/Given 04/10/15 2355)  dextrose 50 % solution 50 mL (50 mLs Intravenous Given 04/10/15 2005)  sodium chloride 0.9 % bolus 500 mL (500 mLs Intravenous New Bag/Given 04/10/15 2355)    Patient Vitals for the past 24 hrs:  BP Temp Temp src Pulse Resp SpO2  04/11/15 0007 96/60 mmHg - - - - -  04/10/15 2353 92/57 mmHg - - (!) 48 16 96 %  04/10/15 2245 (!) 99/54 mmHg - - (!) 56 21 100 %  04/10/15 2130 152/89 mmHg - - - - -  04/10/15 2123 (!) 121/101 mmHg - - 74 24 100 %  04/10/15 2115 - - - (!) 151 23 100 %  04/10/15 2030 105/63 mmHg - - (!) 59 19 -  04/10/15 2000 92/74 mmHg - - 87 (!) 29 100 %  04/10/15 1945 (!) 88/56 mmHg - - 73 19 100 %  04/10/15 1753 - - - 100 - 96 %  04/10/15 1752 127/96 mmHg - - - 19 -  04/10/15 1751 - 97.4 F (36.3 C) Oral - - -  04/10/15 1738 (!) 96/48 mmHg - - 82 20 99 %  04/10/15 1736 - - - - - 97 %    BUN  Date Value Ref Range Status  04/10/2015 60* 6 - 20 mg/dL Final  16/10/960410/12/2014 33* 6 - 20 mg/dL Final  54/09/811910/11/2014 33* 6 - 20 mg/dL Final  14/78/295610/10/2014 51* 6 - 20 mg/dL Final   CREATININE, SER  Date Value Ref Range Status  04/10/2015 1.75* 0.61 - 1.24 mg/dL Final  21/30/865710/12/2014 8.461.14 0.61 - 1.24 mg/dL Final  96/29/528410/11/2014 1.321.40* 0.61 - 1.24 mg/dL Final  44/01/027210/10/2014 5.361.72* 0.61 - 1.24 mg/dL Final      64:4011:10 PM Reevaluation with update and discussion. After initial assessment and treatment, an updated evaluation reveals patient alert, calm, cooperative. Vital signs are reassuring. He has been able to eat and drink and has maintained a normal CBG. Valisha Heslin L   23:12- attempted to contact  patient's nephew, with whom he lives. No one answers at the number given.  MDM- transient altered mental status associated with shaking. Nonspecific findings. Previous similar, evaluated fully, with no findings for seizure disorder. Doubt serous bacterial infection. Metabolic instability or impending vascular collapse. Mild AKI, based on renal function, change. Mild Hypotension.  Doubt Sepsis, SBI or impending vascular collapse.  CRITICAL CARE Performed by: Flint MelterWENTZ,Jazell Rosenau L Total critical care time: 40 minutes Critical care time was exclusive of separately billable procedures and treating other patients. Critical care was necessary to treat or prevent imminent or life-threatening deterioration. Critical care was time spent personally by me on the following activities: development of treatment plan with patient and/or surrogate as well as nursing, discussions with consultants, evaluation of patient's response to treatment, examination of patient, obtaining history from patient or surrogate, ordering and performing treatments and interventions, ordering and review of laboratory studies, ordering and review of radiographic studies, pulse oximetry and re-evaluation of patient's condition.   Diagnoses that have been ruled out:  None  Diagnoses that are still under consideration:  None  Final diagnoses:  Transient alteration of awareness  Hypoglycemia associated with diabetes (HCC)  Hypotension, unspecified hypotension type  AKI (acute kidney injury) [facility])    Medical screening examination/treatment/procedure(s) were conducted as a shared visit with non-physician practitioner(s) and myself.  I personally evaluated the patient during the encounter  Mancel Bale, MD 04/11/15 [phone]

## 2015-04-10 NOTE — ED Notes (Signed)
Notified by lab of a critical glucose value of 39. Pt asymptomatic, A&Ox 4 at this time. Noelle PennerSerena Sam, PA notified. Pt given 238mL of orange juice.

## 2015-04-10 NOTE — ED Notes (Signed)
Per EMS:  Pt from home where he lives with his nephew.  Pt was woken up from a nap approx 1600 and pt was experiencing significant tremors in all four extremities.  No neuro deficits during tremors.  Tremors have become less pronounced during transport, and if patient is distracted with tasks (neuro exam, holding things) tremor seems to subside.  Pt has a hx of DM, HTN,. CHF and Hyperlipidemia.  [facility] same, and it was related to "a renal problem".  Pt was hypotensive (88/46) with EMS.  100ml of fluid given en route.  EMS states EKG showed paced rhythm.  PATIENT IS HARD OF HEARING.  Nephew would like to be contacted with updates - [phone] [name]([name])

## 2015-04-10 NOTE — ED Provider Notes (Signed)
CSN: [id number]     Arrival date & time 04/10/15  1736 History   First MD Initiated Contact with Patient 04/10/15 1743     Chief Complaint  Patient presents with  . Tremors    HPI   Christopher Waller is an 82 y.o. male with history of dementia, CAD, afib s/p AICD who presents to the ED for evaluation of tremors / possible seizure. In the ED he is unaccompanied by his family members so history is limited due to dementia. Per EMS pt's family called because pt woke up from a nap around 4PM and had a witnessed episode of possible seizure with jerking/shaking of all four of his extremities. In the ED pt has minimal bilateral resting tremor in his hands but no other jerking movements. He is oriented to person, does not know where he is, and states the year is 2016. This appears to be baseline with his dementia. He states he is here (does not know where "here" is) because he woke up from a nap and was shaking all over. Denies any pain currently.  EMR review reveals pt was admitted in 12/2014 after a similar episode. Neurology workup unrevealing. MRI unable to be performed due to pt's AICD. There was a suggestion if ARF due to pt's seroquel use had been contributing to pt's symptoms and he was subsequently switched from seroquel to depakote. EEG was also done and pt's symptoms not felt to be seizures.  After initial evaluation of patient I was able to speak via telephone with his nephew Christopher Waller who is also his HCPOA. Pt's nephew states that since his October admission pt has had intermittent episodes of arm jerking but nothing to the severity of what it was today. He states pt woke up from his nap and was getting dressed when family saw him begin shaking and having severe shaking of all four extremities. They did not witness any vomiting, bleeding, or biting of the tongue. Pt is incontinent at baseline. Pt reportedly appeared very uncomfortable and could only say "help me, help me" during this episode. The episode  apparently lasted approximately 15-20 minutes and then the pt gradually returned to baseline. Pt's nephew does not think the pt is currently on Depakote.   Nephew would like to be contacted with updates - [phone] Christopher Done([name])  Past Medical History  Diagnosis Date  . Coronary artery disease   . Dementia   . Diabetes mellitus without complication (HCC)   . Hyperlipidemia   . Ischemic cardiomyopathy 01/05/2015  . Biventricular implantable cardioverter-defibrillator medtronic  01/05/2015   Past Surgical History  Procedure Laterality Date  . Pacemaker insertion    . Coronary artery bypass graft     History reviewed. No pertinent family history. Social History  Substance Use Topics  . Smoking status: Former Smoker    Quit date: 03/30/1964  . Smokeless tobacco: None  . Alcohol Use: No    Review of Systems  Unable to perform ROS: Dementia      Allergies  Review of patient's allergies indicates no known allergies.  Home Medications   Prior to Admission medications   Medication Sig Start Date End Date Taking? Authorizing Provider  aspirin 81 MG tablet Take 81 mg by mouth daily with breakfast.     Historical Provider, MD  divalproex (DEPAKOTE SPRINKLE) 125 MG capsule Take 2 capsules (250 mg total) by mouth every 8 (eight) hours. 01/07/15   Shanker Levora DredgeM Ghimire, MD  fenofibrate (TRICOR) 145 MG tablet Take 145 mg  by mouth at bedtime.     Historical Provider, MD  furosemide (LASIX) 40 MG tablet Take 40 mg by mouth daily with breakfast.     Historical Provider, MD  glipiZIDE (GLUCOTROL XL) 10 MG 24 hr tablet Take 10 mg by mouth daily with breakfast.    Historical Provider, MD  guaiFENesin (ROBITUSSIN) 100 MG/5ML liquid Take 200 mg by mouth 3 (three) times daily as needed for cough.    Historical Provider, MD  lisinopril (PRINIVIL,ZESTRIL) 10 MG tablet Take 10 mg by mouth daily.    Historical Provider, MD  Meclizine HCl (BONINE PO) Take 1 tablet by mouth daily as needed (for dizziness).     Historical Provider, MD  metoprolol succinate (TOPROL-XL) 50 MG 24 hr tablet Take 50 mg by mouth daily with breakfast. Take with or immediately following a meal.    Historical Provider, MD  sitaGLIPtin (JANUVIA) 100 MG tablet Take 100 mg by mouth daily with breakfast.     Historical Provider, MD   BP 127/96 mmHg  Pulse 100  Temp(Src) 97.4 F (36.3 C) (Oral)  Resp 19  SpO2 96% Physical Exam  Constitutional:  Pleasant, appears comfortable  HENT:  Right Ear: External ear normal.  Left Ear: External ear normal.  Nose: Nose normal.  Mouth/Throat: Oropharynx is clear and moist. No oropharyngeal exudate.  Eyes: Conjunctivae and EOM are normal. Pupils are equal, round, and reactive to light.  Neck: Normal range of motion. Neck supple.  Cardiovascular: Normal rate, regular rhythm and normal heart sounds.   Pulmonary/Chest: Effort normal and breath sounds normal. No respiratory distress. He exhibits no tenderness.  Abdominal: Soft. Bowel sounds are normal. He exhibits no distension. There is no tenderness.  Musculoskeletal: He exhibits no edema.  Neurological: He is alert. No cranial nerve deficit.  Mild bilateral resting tremor in hands. No pronator drift. No jerking movements. Normal finger to nose. Oriented to person. States year is 2016.  Skin: Skin is warm and dry.  Psychiatric: He has a normal mood and affect.  Nursing note and vitals reviewed.  Filed Vitals:   04/10/15 1752 04/10/15 1753 04/10/15 1945 04/10/15 2000  BP: 127/96  88/56 92/74  Pulse:  100 73 87  Temp:      TempSrc:      Resp: 19  19 29   SpO2:  96% 100% 100%     ED Course  Procedures (including critical care time) Labs Review Labs Reviewed  COMPREHENSIVE METABOLIC PANEL - Abnormal; Notable for the following:    Glucose, Bld 39 (*)    BUN 60 (*)    Creatinine, Ser 1.75 (*)    Total Protein 6.3 (*)    Albumin 3.1 (*)    GFR calc non Af Amer 33 (*)    GFR calc Af Amer 38 (*)    All other components  within normal limits  CBC WITH DIFFERENTIAL/PLATELET - Abnormal; Notable for the following:    RBC 3.85 (*)    Hemoglobin 12.1 (*)    HCT 37.2 (*)    Platelets 142 (*)    All other components within normal limits  URINALYSIS, ROUTINE W REFLEX MICROSCOPIC (NOT AT [facility]) - Abnormal; Notable for the following:    APPearance CLOUDY (*)    Hgb urine dipstick SMALL (*)    All other components within normal limits  VALPROIC ACID LEVEL - Abnormal; Notable for the following:    Valproic Acid Lvl 18 (*)    All other components within normal limits  URINE  MICROSCOPIC-ADD ON - Abnormal; Notable for the following:    Squamous Epithelial / LPF 0-5 (*)    Bacteria, UA RARE (*)    Casts HYALINE CASTS (*)    All other components within normal limits  CBG MONITORING, ED - Abnormal; Notable for the following:    Glucose-Capillary 31 (*)    All other components within normal limits  CBG MONITORING, ED - Abnormal; Notable for the following:    Glucose-Capillary 29 (*)    All other components within normal limits  CBG MONITORING, ED - Abnormal; Notable for the following:    Glucose-Capillary 160 (*)    All other components within normal limits  CBG MONITORING, ED - Abnormal; Notable for the following:    Glucose-Capillary 172 (*)    All other components within normal limits  AMMONIA  I-STAT TROPOININ, ED    Imaging Review No results found. I have personally reviewed and evaluated these images and lab results as part of my medical decision-making.   EKG Interpretation   Date/Time:  Wednesday April 10 2015 17:51:53 EST Ventricular Rate:  78 PR Interval:  117 QRS Duration: 138 QT Interval:  409 QTC Calculation: 466 R Axis:   155 Text Interpretation:  appears to be nonsensed AV pacing. Will repeat.  Confirmed by Effie Shy  MD, ELLIOTT [phone]) on 04/10/2015 5:59:01 PM      MDM   Final diagnoses:  None    CBG 31. Pt alert and continues to be asymptomatic in the ED. Was given some juice  with no improvement. Will give D50. Cr increased to 1.75, which appears to be higher than baseline. Cr was 1.95 at last admission. CBC at baseline. Ammonia 20. depakote level still pending.   CBG improved to 160 with D50. Labs otherwise unremarkable. Will order meal tray and monitor glucose levels. As long as levels remain stable anticipate d/c home. Due to end of shift Dr. Effie Shy to take over patient care.  Carlene Coria, PA-C 04/10/15 2255  Mancel Bale, MD 04/11/15 [phone]

## 2015-04-11 ENCOUNTER — Inpatient Hospital Stay (HOSPITAL_COMMUNITY): Payer: Medicare Other

## 2015-04-11 ENCOUNTER — Encounter (HOSPITAL_COMMUNITY): Payer: Self-pay | Admitting: Internal Medicine

## 2015-04-11 DIAGNOSIS — Z95810 Presence of automatic (implantable) cardiac defibrillator: Secondary | ICD-10-CM | POA: Diagnosis not present

## 2015-04-11 DIAGNOSIS — I5022 Chronic systolic (congestive) heart failure: Secondary | ICD-10-CM | POA: Diagnosis not present

## 2015-04-11 DIAGNOSIS — E11649 Type 2 diabetes mellitus with hypoglycemia without coma: Secondary | ICD-10-CM | POA: Insufficient documentation

## 2015-04-11 DIAGNOSIS — N189 Chronic kidney disease, unspecified: Secondary | ICD-10-CM | POA: Diagnosis not present

## 2015-04-11 DIAGNOSIS — E860 Dehydration: Secondary | ICD-10-CM | POA: Diagnosis not present

## 2015-04-11 DIAGNOSIS — E1122 Type 2 diabetes mellitus with diabetic chronic kidney disease: Secondary | ICD-10-CM | POA: Diagnosis not present

## 2015-04-11 DIAGNOSIS — Z87891 Personal history of nicotine dependence: Secondary | ICD-10-CM | POA: Diagnosis not present

## 2015-04-11 DIAGNOSIS — R251 Tremor, unspecified: Secondary | ICD-10-CM | POA: Diagnosis present

## 2015-04-11 DIAGNOSIS — I959 Hypotension, unspecified: Secondary | ICD-10-CM | POA: Diagnosis present

## 2015-04-11 DIAGNOSIS — Z7982 Long term (current) use of aspirin: Secondary | ICD-10-CM | POA: Diagnosis not present

## 2015-04-11 DIAGNOSIS — N179 Acute kidney failure, unspecified: Secondary | ICD-10-CM | POA: Diagnosis present

## 2015-04-11 DIAGNOSIS — I9589 Other hypotension: Secondary | ICD-10-CM | POA: Diagnosis not present

## 2015-04-11 DIAGNOSIS — E162 Hypoglycemia, unspecified: Secondary | ICD-10-CM | POA: Diagnosis present

## 2015-04-11 DIAGNOSIS — F0390 Unspecified dementia without behavioral disturbance: Secondary | ICD-10-CM | POA: Diagnosis present

## 2015-04-11 DIAGNOSIS — I350 Nonrheumatic aortic (valve) stenosis: Secondary | ICD-10-CM | POA: Diagnosis present

## 2015-04-11 DIAGNOSIS — I272 Other secondary pulmonary hypertension: Secondary | ICD-10-CM | POA: Diagnosis not present

## 2015-04-11 DIAGNOSIS — I255 Ischemic cardiomyopathy: Secondary | ICD-10-CM | POA: Diagnosis present

## 2015-04-11 DIAGNOSIS — R079 Chest pain, unspecified: Secondary | ICD-10-CM | POA: Diagnosis not present

## 2015-04-11 DIAGNOSIS — E785 Hyperlipidemia, unspecified: Secondary | ICD-10-CM | POA: Diagnosis not present

## 2015-04-11 DIAGNOSIS — Z7984 Long term (current) use of oral hypoglycemic drugs: Secondary | ICD-10-CM | POA: Diagnosis not present

## 2015-04-11 DIAGNOSIS — I2510 Atherosclerotic heart disease of native coronary artery without angina pectoris: Secondary | ICD-10-CM | POA: Diagnosis not present

## 2015-04-11 DIAGNOSIS — R55 Syncope and collapse: Secondary | ICD-10-CM | POA: Diagnosis not present

## 2015-04-11 DIAGNOSIS — Z951 Presence of aortocoronary bypass graft: Secondary | ICD-10-CM | POA: Diagnosis not present

## 2015-04-11 DIAGNOSIS — L89312 Pressure ulcer of right buttock, stage 2: Secondary | ICD-10-CM | POA: Diagnosis not present

## 2015-04-11 DIAGNOSIS — Z23 Encounter for immunization: Secondary | ICD-10-CM | POA: Diagnosis not present

## 2015-04-11 LAB — CBC WITH DIFFERENTIAL/PLATELET
BASOS ABS: 0 10*3/uL (ref 0.0–0.1)
Basophils Relative: 0 %
EOS PCT: 2 %
Eosinophils Absolute: 0.1 10*3/uL (ref 0.0–0.7)
HEMATOCRIT: 33 % — AB (ref 39.0–52.0)
Hemoglobin: 10.4 g/dL — ABNORMAL LOW (ref 13.0–17.0)
LYMPHS ABS: 1.1 10*3/uL (ref 0.7–4.0)
LYMPHS PCT: 27 %
MCH: 30.5 pg (ref 26.0–34.0)
MCHC: 31.5 g/dL (ref 30.0–36.0)
MCV: 96.8 fL (ref 78.0–100.0)
MONO ABS: 0.6 10*3/uL (ref 0.1–1.0)
Monocytes Relative: 13 %
NEUTROS ABS: 2.3 10*3/uL (ref 1.7–7.7)
Neutrophils Relative %: 58 %
Platelets: 126 10*3/uL — ABNORMAL LOW (ref 150–400)
RBC: 3.41 MIL/uL — AB (ref 4.22–5.81)
RDW: 13.9 % (ref 11.5–15.5)
WBC: 4.1 10*3/uL (ref 4.0–10.5)

## 2015-04-11 LAB — COMPREHENSIVE METABOLIC PANEL
ALBUMIN: 2.5 g/dL — AB (ref 3.5–5.0)
ALT: 21 U/L (ref 17–63)
ANION GAP: 6 (ref 5–15)
AST: 38 U/L (ref 15–41)
Alkaline Phosphatase: 31 U/L — ABNORMAL LOW (ref 38–126)
BILIRUBIN TOTAL: 0.7 mg/dL (ref 0.3–1.2)
BUN: 52 mg/dL — AB (ref 6–20)
CHLORIDE: 112 mmol/L — AB (ref 101–111)
CO2: 24 mmol/L (ref 22–32)
Calcium: 8 mg/dL — ABNORMAL LOW (ref 8.9–10.3)
Creatinine, Ser: 1.56 mg/dL — ABNORMAL HIGH (ref 0.61–1.24)
GFR calc Af Amer: 44 mL/min — ABNORMAL LOW (ref >60)
GFR calc non Af Amer: 38 mL/min — ABNORMAL LOW (ref >60)
GLUCOSE: 125 mg/dL — AB (ref 65–99)
POTASSIUM: 4.4 mmol/L (ref 3.5–5.1)
SODIUM: 142 mmol/L (ref 135–145)
TOTAL PROTEIN: 5.3 g/dL — AB (ref 6.5–8.1)

## 2015-04-11 LAB — PROCALCITONIN

## 2015-04-11 LAB — VALPROIC ACID LEVEL: VALPROIC ACID LVL: 13 ug/mL — AB (ref 50.0–100.0)

## 2015-04-11 LAB — GLUCOSE, CAPILLARY
GLUCOSE-CAPILLARY: 99 mg/dL (ref 65–99)
GLUCOSE-CAPILLARY: 158 mg/dL — AB (ref 65–99)
GLUCOSE-CAPILLARY: 121 mg/dL — AB (ref 65–99)
Glucose-Capillary: 72 mg/dL (ref 65–99)
Glucose-Capillary: 107 mg/dL — ABNORMAL HIGH (ref 65–99)
Glucose-Capillary: 198 mg/dL — ABNORMAL HIGH (ref 65–99)

## 2015-04-11 LAB — MRSA PCR SCREENING: MRSA by PCR: NEGATIVE

## 2015-04-11 LAB — I-STAT CG4 LACTIC ACID, ED: LACTIC ACID, VENOUS: 1.47 mmol/L (ref 0.5–2.0)

## 2015-04-11 MED ORDER — FENOFIBRATE 160 MG PO TABS
160.0000 mg | ORAL_TABLET | Freq: Every day | ORAL | Status: DC
Start: 2015-04-11 — End: 2015-04-15
  Administered 2015-04-11 – 2015-04-15 (×5): 160 mg via ORAL
  Filled 2015-04-11 (×5): qty 1

## 2015-04-11 MED ORDER — ACETAMINOPHEN 325 MG PO TABS: 650.0000 mg | ORAL_TABLET | Freq: Four times a day (QID) | ORAL | Status: DC | PRN

## 2015-04-11 MED ORDER — SODIUM CHLORIDE 0.9 % IV SOLN
INTRAVENOUS | Status: DC
  Administered 2015-04-14: 05:00:00 via INTRAVENOUS

## 2015-04-11 MED ORDER — ASPIRIN EC 81 MG PO TBEC
81.0000 mg | DELAYED_RELEASE_TABLET | Freq: Every day | ORAL | Status: DC
  Administered 2015-04-11 – 2015-04-12 (×2): 81 mg via ORAL
  Filled 2015-04-11 (×3): qty 1

## 2015-04-11 MED ORDER — DIVALPROEX SODIUM 125 MG PO CSDR
250.0000 mg | DELAYED_RELEASE_CAPSULE | Freq: Three times a day (TID) | ORAL | Status: DC
  Administered 2015-04-11 – 2015-04-15 (×15): 250 mg via ORAL
  Filled 2015-04-11 (×17): qty 2

## 2015-04-11 MED ORDER — ACETAMINOPHEN 650 MG RE SUPP
650.0000 mg | Freq: Four times a day (QID) | RECTAL | Status: DC | PRN
Start: 2015-04-11 — End: 2015-04-15

## 2015-04-11 MED ORDER — ENOXAPARIN SODIUM 30 MG/0.3ML ~~LOC~~ SOLN
30.0000 mg | Freq: Every day | SUBCUTANEOUS | Status: DC
  Administered 2015-04-11 – 2015-04-15 (×5): 30 mg via SUBCUTANEOUS
  Filled 2015-04-11 (×5): qty 0.3

## 2015-04-11 MED ORDER — SODIUM CHLORIDE 0.9 % IV BOLUS (SEPSIS)
1000.0000 mL | Freq: Once | INTRAVENOUS | Status: AC
  Administered 2015-04-11: 1000 mL via INTRAVENOUS

## 2015-04-11 MED ORDER — ONDANSETRON HCL 4 MG PO TABS: 4.0000 mg | ORAL_TABLET | Freq: Four times a day (QID) | ORAL | Status: DC | PRN

## 2015-04-11 MED ORDER — SODIUM CHLORIDE 0.9 % IV SOLN
INTRAVENOUS | Status: AC
  Administered 2015-04-11: 04:00:00 via INTRAVENOUS

## 2015-04-11 MED ORDER — ONDANSETRON HCL 4 MG/2ML IJ SOLN: 4.0000 mg | Freq: Four times a day (QID) | INTRAMUSCULAR | Status: DC | PRN

## 2015-04-11 NOTE — Progress Notes (Signed)
[facility] TEAM 1 - Stepdown/ICU TEAM Progress Note  Amato Vernier ZOX:[id number] DOB: 31-Oct-1924 DOA: 04/10/2015 PCP: No PCP Per Patient  Admit HPI / Brief Narrative: 82 y.o. male PMHx DM Type 2,  Chronic Systolic CHF, Dilated Cardiomyopathy, Pulmonary HTN, , CAD S/P  CABG, S/P Biventricular implantable cardioverter-defibrillator medtronic,CKD   Brought to the ER after patient was found to have bradycardia episode of confusion sinking spell. Patient denies losing consciousness. Patient's blood sugar was found to be in 30s and was brought to the ER. In the ER patient was given D50 and patient's blood sugar slowly improved. Patient denies any chest pain shortness of breath nausea vomiting or diarrhea. In the ER patient was found to be hypotensive and improved with 2 L normal saline bolus. Patient does not look septic. Patient will be admitted for further observation.    HPI/Subjective: 1/12 A/O 4 (patient did not know name of hospital but did note he was in the hospital), states did not actually fall but is slumped over. States will cause giving out of bed feeling lightheaded and fatigued and then slumped over onto the floor. States has had a cold with positive productive cough (clear), negative SOB negative diaphoresis negative chest pain.   Assessment/Plan:  Syncope and collapse -Most likely secondary to hypoglycemia -PT/OT consult patient with MMP, appears weak may benefit from rehabilitation -However given patient's cardiac history will also obtain echocardiogram  Hypoglycemia in a patient with diabetes mellitus type 2  - cause not clear.  -Patient states he has been eating well.  - Patient lives with his nephew and at this time unable to reach his nephew. Note per nursing upon admission patient was wearing 3 soiled diapers and was very unKept  Adult Protective Services contacted - At this time holding patient's glipizide and Januvia and we will closely follow CBGs every 2  hourly.  CAD native artery  -S/P CABG - denies any chest pain. -S/P AICD placement   Systolic CHF/ischemic cardiomyopathy - last measured EF 35-40% - at this time holding of Lasix and lisinopril due to hypotension. Closely follow respiratory status.  Hypotension  - probably from dehydration and poor oral intake. Holding off antihypertensive and gently hydrating. Caution that patient has history of systolic heart failure.  -Follow lactic acid levels blood cultures and procalcitonin levels. -Normal saline 91ml/hr -Orthostatic vitals in Am  Acute on chronic renal failure  -probably from dehydration and hypotension - gently hydrate and hold antihypertensives including lisinopril.  Dementia  - check Depakote levels.    Code Status: Partial Family Communication: no family present at time of exam Disposition Plan: SNF?    Consultants:   Procedure/Significant Events: 01/04/2015 echocardiogram- Left ventricle:  mild LVH. -LVEF= 35% to 40%. - Aortic valve: moderate. - Left atrium: mildly to moderately dilated.- Right ventricle: mildly dilated.  - Right atrium: mildly to moderately dilated. - Pulmonary arteries: PA peak pressure: 52 mm Hg (S). 1/12 PCXR; No acute cardiopulmonary process   Culture NA  Antibiotics: NA  DVT prophylaxis: Lovenox   Devices NA   LINES / TUBES:  NA    Continuous Infusions: . sodium chloride      Objective: VITAL SIGNS: Temp: 98 F (36.7 C) (01/12 1614) Temp Source: Oral (01/12 1614) BP: 94/78 mmHg (01/12 1614) Pulse Rate: 127 (01/12 1614) SPO2; FIO2:   Intake/Output Summary (Last 24 hours) at 04/11/15 1943 Last data filed at 04/11/15 1300  Gross per 24 hour  Intake 823.33 ml  Output    450  ml  Net 373.33 ml     Exam: General: A/O 4 (patient did not know name of hospital but did note he was in the hospital), No acute respiratory distress Eyes: Negative headache, negative scleral hemorrhage ENT: Negative Runny  negative gingival bleeding, Neck:  Negative scars, masses, torticollis, lymphadenopathy, JVD Lungs: positive right lung rhonchi, Clear to auscultation left lung fields, without wheezes or crackles Cardiovascular: Regular rate and rhythm without murmur gallop or rub normal S1 and S2 Abdomen:negative abdominal pain, nondistended, positive soft, bowel sounds, no rebound, no ascites, no appreciable mass Extremities: No significant cyanosis, clubbing, or edema bilateral lower extremities Psychiatric:  Negative depression, negative anxiety, negative fatigue, negative mania  Neurologic:  Cranial nerves II through XII intact, tongue/uvula midline, all extremities muscle strength 5/5, sensation intact throughout, negative dysarthria, negative expressive aphasia, negative receptive aphasia.   Data Reviewed: Basic Metabolic Panel:  Recent Labs Lab 04/10/15 1833 04/11/15 0312  NA 141 142  K 4.3 4.4  CL 103 112*  CO2 24 24  GLUCOSE 39* 125*  BUN 60* 52*  CREATININE 1.75* 1.56*  CALCIUM 9.0 8.0*   Liver Function Tests:  Recent Labs Lab 04/10/15 1833 04/11/15 0312  AST 38 38  ALT 22 21  ALKPHOS 38 31*  BILITOT 0.5 0.7  PROT 6.3* 5.3*  ALBUMIN 3.1* 2.5*   No results for input(s): LIPASE, AMYLASE in the last 168 hours.  Recent Labs Lab 04/10/15 1834  AMMONIA 20   CBC:  Recent Labs Lab 04/10/15 1833 04/11/15 0312  WBC 5.6 4.1  NEUTROABS 3.7 2.3  HGB 12.1* 10.4*  HCT 37.2* 33.0*  MCV 96.6 96.8  PLT 142* 126*   Cardiac Enzymes: No results for input(s): CKTOTAL, CKMB, CKMBINDEX, TROPONINI in the last 168 hours. BNP (last 3 results) No results for input(s): BNP in the last 8760 hours.  ProBNP (last 3 results) No results for input(s): PROBNP in the last 8760 hours.  CBG:  Recent Labs Lab 04/11/15 0608 04/11/15 0807 04/11/15 1010 04/11/15 1319 04/11/15 1618  GLUCAP 72 99 107* 158* 198*    Recent Results (from the past 240 hour(s))  MRSA PCR Screening      Status: None   Collection Time: 04/11/15  4:07 AM  Result Value Ref Range Status   MRSA by PCR NEGATIVE NEGATIVE Final    Comment:        The GeneXpert MRSA Assay (FDA approved for NASAL specimens only), is one component of a comprehensive MRSA colonization surveillance program. It is not intended to diagnose MRSA infection nor to guide or monitor treatment for MRSA infections.      Studies:  Recent x-ray studies have been reviewed in detail by the Attending Physician  Scheduled Meds:  Scheduled Meds: . aspirin EC  81 mg Oral Q breakfast  . divalproex  250 mg Oral 3 times per day  . enoxaparin (LOVENOX) injection  30 mg Subcutaneous Daily  . fenofibrate  160 mg Oral Daily    Time spent on care of this patient: 40 mins   Braiden Presutti, Roselind MessierURTIS J , MD  [facility] Office  [phone] Pager [phone]  On-Call/Text Page:      [email or web address]      password [email or web address]  If 7PM-7AM, please contact night-coverage [email or web address] Password [email or web address] 04/11/2015, 7:43 PM   LOS: 0 days   Care during the described time interval was provided by me .  I have reviewed this patient's available data, including medical history, events of note, physical examination, and all  test results as part of my evaluation. I have personally reviewed and interpreted all radiology studies.   Dia Crawford, MD [phone] Pager

## 2015-04-11 NOTE — ED Notes (Signed)
Attempted report x1. 

## 2015-04-11 NOTE — Progress Notes (Signed)
Utilization Review Completed.Christopher Waller T1/02/2016  
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
## 2015-04-11 NOTE — Progress Notes (Signed)
Inpatient Diabetes Program Recommendations  AACE/ADA: New Consensus Statement on Inpatient Glycemic Control (2015)  Target Ranges:  Prepandial:   less than 140 mg/dL      Peak postprandial:   less than 180 mg/dL (1-2 hours)      Critically ill patients:  140 - 180 mg/dL   Review of Glycemic Control  Diabetes history: type 2 dm Outpatient Diabetes medications: Glucotrol and Current orders for Inpatient glycemic control: None  Inpatient Diabetes Program Recommendations: Patient has had hypoglycemia times 2 while here. Patient with acute on chronic kidney disease. PO intake may not have been sufficient-Please consider adding dextrose to IV fluids. May want to decrease glucotrol to 5 mg/day for discharge and to give only if patient's po intake is sufficient.  Thank you Lenor CoffinAnn Laquanna Veazey, RN, MSN, CDE  Diabetes Inpatient Program Office: [phone] Pager: [phone] 8:00 am to 5:00 pm

## 2015-04-11 NOTE — H&P (Signed)
[facility] History and Physical  Christopher Waller AVW:[id number] DOB: Oct 07, 1924 DOA: 04/10/2015  Referring physician: Dr. Effie Waller. PCP: No PCP Per Patient  Specialists: None.  History obtained from ER physician. Unable to reach family.  Chief Complaint: Brief episode of confusion and shaking spell.  HPI: Christopher Waller is a 82 y.o. male with history of diabetes mellitus type 2, CHF, CAD status post CABG, chronic kidney disease was brought to the ER after patient was found to have bradycardia episode of confusion sinking spell. Patient denies losing consciousness. Patient's blood sugar was found to be in 30s and was brought to the ER. In the ER patient was given D50 and patient's blood sugar slowly improved. Patient denies any chest pain shortness of breath nausea vomiting or diarrhea. In the ER patient was found to be hypotensive and improved with 2 L normal saline bolus. Patient does not look septic. Patient will be admitted for further observation.   Review of Systems: As presented in the history of presenting illness, rest negative.  Past Medical History  Diagnosis Date  . Coronary artery disease   . Dementia   . Diabetes mellitus without complication (HCC)   . Hyperlipidemia   . Ischemic cardiomyopathy 01/05/2015  . Biventricular implantable cardioverter-defibrillator medtronic  01/05/2015   Past Surgical History  Procedure Laterality Date  . Pacemaker insertion    . Coronary artery bypass graft     Social History:  reports that he quit smoking about 51 years ago. He does not have any smokeless tobacco history on file. He reports that he does not drink alcohol. His drug history is not on file. Where does patient live home. Can patient participate in ADLs? Yes.  No Known Allergies  Family History:  Family History  Problem Relation Age of Onset  . Hypertension Other       Prior to Admission medications   Medication Sig Start Date End Date Taking? Authorizing Provider   aspirin 81 MG tablet Take 81 mg by mouth daily with breakfast.    Yes Historical Provider, MD  divalproex (DEPAKOTE SPRINKLE) 125 MG capsule Take 2 capsules (250 mg total) by mouth every 8 (eight) hours. 01/07/15  Yes Christopher Levora Dredge, MD  fenofibrate (TRICOR) 145 MG tablet Take 145 mg by mouth at bedtime.    Yes Historical Provider, MD  furosemide (LASIX) 40 MG tablet Take 40 mg by mouth daily with breakfast.    Yes Historical Provider, MD  glipiZIDE (GLUCOTROL XL) 10 MG 24 hr tablet Take 10 mg by mouth daily with breakfast.   Yes Historical Provider, MD  guaiFENesin (ROBITUSSIN) 100 MG/5ML liquid Take 200 mg by mouth 3 (three) times daily as needed for cough.   Yes Historical Provider, MD  lisinopril (PRINIVIL,ZESTRIL) 10 MG tablet Take 10 mg by mouth daily.   Yes Historical Provider, MD  Meclizine HCl (BONINE PO) Take 1 tablet by mouth daily as needed (for dizziness).   Yes Historical Provider, MD  metoprolol succinate (TOPROL-XL) 50 MG 24 hr tablet Take 50 mg by mouth daily with breakfast. Take with or immediately following a meal.   Yes Historical Provider, MD  sitaGLIPtin (JANUVIA) 100 MG tablet Take 100 mg by mouth daily with breakfast.    Yes Historical Provider, MD    Physical Exam: Filed Vitals:   04/10/15 2130 04/10/15 2245 04/10/15 2353 04/11/15 0007  BP: 152/89 99/54 92/57  96/60  Pulse:  56 48   Temp:      TempSrc:      Resp:  21 16   SpO2:  100% 96%      General:  Moderately built and nourished.  Eyes: Anicteric no pallor.  ENT: No discharge from the ears eyes nose or mouth.  Neck: No mass felt.  Cardiovascular: S1 and S2 heard.  Respiratory: No rhonchi or crepitations.  Abdomen: Soft nontender bowel sounds present. No guarding or rigidity.  Skin: No rash.  Musculoskeletal: No edema.  Psychiatric: Appears normal.  Neurologic: Alert awake oriented to time place and person. Moves all extremities.  Labs on Admission:  Basic Metabolic Panel:  Recent  Labs Lab 04/10/15 1833  NA 141  K 4.3  CL 103  CO2 24  GLUCOSE 39*  BUN 60*  CREATININE 1.75*  CALCIUM 9.0   Liver Function Tests:  Recent Labs Lab 04/10/15 1833  AST 38  ALT 22  ALKPHOS 38  BILITOT 0.5  PROT 6.3*  ALBUMIN 3.1*   No results for input(s): LIPASE, AMYLASE in the last 168 hours.  Recent Labs Lab 04/10/15 1834  AMMONIA 20   CBC:  Recent Labs Lab 04/10/15 1833  WBC 5.6  NEUTROABS 3.7  HGB 12.1*  HCT 37.2*  MCV 96.6  PLT 142*   Cardiac Enzymes: No results for input(s): CKTOTAL, CKMB, CKMBINDEX, TROPONINI in the last 168 hours.  BNP (last 3 results) No results for input(s): BNP in the last 8760 hours.  ProBNP (last 3 results) No results for input(s): PROBNP in the last 8760 hours.  CBG:  Recent Labs Lab 04/10/15 1942 04/10/15 2004 04/10/15 2025 04/10/15 2141  GLUCAP 31* 29* 160* 172*    Radiological Exams on Admission: No results found.   Assessment/Plan Principal Problem:   Hypoglycemia Active Problems:   Biventricular implantable cardioverter-defibrillator medtronic    Ischemic cardiomyopathy   AKI (acute kidney injury) (HCC)   Hypotension   1. Hypoglycemia in a patient with diabetes mellitus type 2 - cause not clear. Patient states he has been eating well. Will need to reach patient's family. Patient lives with his nephew and at this time unable to reach his nephew. Need to make sure that patient was not taking more than required medications. At this time holding patient's glipizide and Januvia and we will closely follow CBGs every 2 hourly. 2. Hypotension - probably from dehydration and poor oral intake. Holding off antihypertensive and gently hydrating. Caution that patient has history of systolic heart failure. Follow lactic acid levels blood cultures and procalcitonin levels. 3. Acute on chronic renal failure probably from dehydration and hypotension - gently hydrate and hold antihypertensives including  lisinopril. 4. Dementia - check Depakote levels. 5. CAD status post CABG - denies any chest pain. 6. History of CHF, systolic last EF measured was 66-06%35-40% status post AICD placement - at this time holding of Lasix and lisinopril due to hypotension. Closely follow respiratory status.   DVT Prophylaxis Lovenox.  Code Status: DO NOT INTUBATE.  Family Communication: Unable to reach family.   Disposition Plan: Admit to inpatient.    Kearra Calkin N. [facility] Pager [phone].  If 7PM-7AM, please contact night-coverage [email or web address] Password [email or web address] 04/11/2015, 12:29 AM

## 2015-04-11 NOTE — Evaluation (Signed)
Physical Therapy Evaluation Patient Details Name: Christopher Waller MRN: [id number] DOB: [birth year] Today's Date: 04/11/2015   History of Present Illness  Christopher Waller is a 82 y.o. male with history of diabetes mellitus type 2, CHF, CAD status post CABG, chronic kidney disease was brought to the ER after patient was found to have bradycardia episode of confusion sinking spell.  Found to be hypotensive and hypoglycemic.  Clinical Impression  Patient presents with decreased mobility due to deficits listed in PT problem list.  He will benefit from skilled PT in the acute setting to allow return home with family support.  If family unable to care for pt providing 24 hour assist, may need STSNF.      Follow Up Recommendations Home health PT;Supervision/Assistance - 24 hour    Equipment Recommendations  None recommended by PT    Recommendations for Other Services       Precautions / Restrictions Precautions Precautions: Fall      Mobility  Bed Mobility Overal bed mobility: Needs Assistance Bed Mobility: Supine to Sit     Supine to sit: Min assist;HOB elevated     General bed mobility comments: assist for lifting trunk with rail   Transfers Overall transfer level: Needs assistance Equipment used: Rolling walker (2 wheeled) Transfers: Sit to/from Stand Sit to Stand: Min assist;Mod assist         General transfer comment: assist for anterior wt shift and lifting, SPT to chair min/mod A cues for hand placement  Ambulation/Gait                Stairs            Wheelchair Mobility    Modified Rankin (Stroke Patients Only)       Balance Overall balance assessment: Needs assistance Sitting-balance support: Bilateral upper extremity supported Sitting balance-Leahy Scale: Poor Sitting balance - Comments: leaning posterior, at least S for safety, at times min A Postural control: Posterior lean Standing balance support: Bilateral upper extremity  supported Standing balance-Leahy Scale: Poor Standing balance comment: UE support needed in standing during perineal hygiene due to fecal incontinence in bed, able to stand with support and assist, then able to stand briefly with UE support only and S                             Pertinent Vitals/Pain Pain Assessment: No/denies pain    Home Living Family/patient expects to be discharged to:: Private residence Living Arrangements: Other relatives (nephew and his wife) Available Help at Discharge: Family Type of Home: House Home Access: Stairs to enter   Secretary/administratorntrance Stairs-Number of Steps: 1 Home Layout: One level Home Equipment: Walker - 2 wheels Additional Comments: Pt is a poor historian. Unsure of accuracy of home living answers. No family available at this time    Prior Function Level of Independence: Needs assistance      ADL's / Homemaking Assistance Needed: states nephew's wife helps him bathe daily        Hand Dominance   Dominant Hand: Right    Extremity/Trunk Assessment               Lower Extremity Assessment: RLE deficits/detail;LLE deficits/detail RLE Deficits / Details: AROM WFL, strength grossly 4-/5 LLE Deficits / Details: AROM WFL, strength grossly 4-/5  Cervical / Trunk Assessment: Kyphotic  Communication   Communication: HOH  Cognition Arousal/Alertness: Awake/alert Behavior During Therapy: WFL for tasks assessed/performed Overall Cognitive Status: No family/caregiver  present to determine baseline cognitive functioning                      General Comments General comments (skin integrity, edema, etc.): reddened areas over buttocks, RN aware    Exercises        Assessment/Plan    PT Assessment Patient needs continued PT services  PT Diagnosis Generalized weakness;Abnormality of gait   PT Problem List Decreased strength;Decreased knowledge of use of DME;Decreased activity tolerance;Decreased balance;Decreased  mobility;Cardiopulmonary status limiting activity;Decreased safety awareness  PT Treatment Interventions DME instruction;Balance training;Gait training;Functional mobility training;Patient/family education;Therapeutic activities;Therapeutic exercise   PT Goals (Current goals can be found in the Care Plan section) Acute Rehab PT Goals Patient Stated Goal: To return home PT Goal Formulation: With patient Time For Goal Achievement: 04/25/15 Potential to Achieve Goals: Good    Frequency Min 3X/week   Barriers to discharge        Co-evaluation               End of Session Equipment Utilized During Treatment: Oxygen Activity Tolerance: Patient limited by fatigue Patient left: in chair;with call bell/phone within reach Nurse Communication: Mobility status         Time: 1455-1520 PT Time Calculation (min) (ACUTE ONLY): 25 min   Charges:   PT Evaluation $PT Eval Moderate Complexity: 1 Procedure PT Treatments $Therapeutic Activity: 8-22 mins   PT G CodesElray Mcgregor 04-13-2015, 3:47 PM  Sheran Lawless, PT [phone] 04/13/2015

## 2015-04-12 ENCOUNTER — Inpatient Hospital Stay (HOSPITAL_COMMUNITY): Payer: Medicare Other

## 2015-04-12 DIAGNOSIS — I272 Other secondary pulmonary hypertension: Secondary | ICD-10-CM

## 2015-04-12 DIAGNOSIS — I959 Hypotension, unspecified: Secondary | ICD-10-CM | POA: Diagnosis present

## 2015-04-12 DIAGNOSIS — R55 Syncope and collapse: Secondary | ICD-10-CM | POA: Diagnosis present

## 2015-04-12 DIAGNOSIS — R079 Chest pain, unspecified: Secondary | ICD-10-CM

## 2015-04-12 DIAGNOSIS — I255 Ischemic cardiomyopathy: Secondary | ICD-10-CM | POA: Diagnosis present

## 2015-04-12 LAB — GLUCOSE, CAPILLARY
GLUCOSE-CAPILLARY: 121 mg/dL — AB (ref 65–99)
GLUCOSE-CAPILLARY: 64 mg/dL — AB (ref 65–99)
Glucose-Capillary: 87 mg/dL (ref 65–99)
Glucose-Capillary: 159 mg/dL — ABNORMAL HIGH (ref 65–99)
Glucose-Capillary: 150 mg/dL — ABNORMAL HIGH (ref 65–99)
Glucose-Capillary: 157 mg/dL — ABNORMAL HIGH (ref 65–99)
Glucose-Capillary: 144 mg/dL — ABNORMAL HIGH (ref 65–99)

## 2015-04-12 LAB — MAGNESIUM: MAGNESIUM: 2.2 mg/dL (ref 1.7–2.4)

## 2015-04-12 LAB — COMPREHENSIVE METABOLIC PANEL
ALK PHOS: 27 U/L — AB (ref 38–126)
ALT: 17 U/L (ref 17–63)
ANION GAP: 5 (ref 5–15)
AST: 27 U/L (ref 15–41)
Albumin: 2.2 g/dL — ABNORMAL LOW (ref 3.5–5.0)
BILIRUBIN TOTAL: 0.5 mg/dL (ref 0.3–1.2)
BUN: 39 mg/dL — ABNORMAL HIGH (ref 6–20)
CALCIUM: 8.2 mg/dL — AB (ref 8.9–10.3)
CO2: 25 mmol/L (ref 22–32)
CREATININE: 1.18 mg/dL (ref 0.61–1.24)
Chloride: 113 mmol/L — ABNORMAL HIGH (ref 101–111)
GFR calc non Af Amer: 53 mL/min — ABNORMAL LOW (ref >60)
Glucose, Bld: 94 mg/dL (ref 65–99)
Potassium: 4.3 mmol/L (ref 3.5–5.1)
SODIUM: 143 mmol/L (ref 135–145)
TOTAL PROTEIN: 4.6 g/dL — AB (ref 6.5–8.1)

## 2015-04-12 LAB — CBC WITH DIFFERENTIAL/PLATELET
BASOS ABS: 0 10*3/uL (ref 0.0–0.1)
BASOS PCT: 0 %
EOS ABS: 0.1 10*3/uL (ref 0.0–0.7)
Eosinophils Relative: 2 %
HCT: 28.8 % — ABNORMAL LOW (ref 39.0–52.0)
HEMOGLOBIN: 9.3 g/dL — AB (ref 13.0–17.0)
Lymphocytes Relative: 22 %
Lymphs Abs: 1.5 10*3/uL (ref 0.7–4.0)
MCH: 31.5 pg (ref 26.0–34.0)
MCHC: 32.3 g/dL (ref 30.0–36.0)
MCV: 97.6 fL (ref 78.0–100.0)
MONOS PCT: 10 %
Monocytes Absolute: 0.7 10*3/uL (ref 0.1–1.0)
NEUTROS PCT: 65 %
Neutro Abs: 4.3 10*3/uL (ref 1.7–7.7)
Platelets: 115 10*3/uL — ABNORMAL LOW (ref 150–400)
RBC: 2.95 MIL/uL — ABNORMAL LOW (ref 4.22–5.81)
RDW: 14 % (ref 11.5–15.5)
WBC: 6.6 10*3/uL (ref 4.0–10.5)

## 2015-04-12 LAB — VALPROIC ACID LEVEL: Valproic Acid Lvl: 33 ug/mL — ABNORMAL LOW (ref 50.0–100.0)

## 2015-04-12 NOTE — Evaluation (Signed)
Occupational Therapy Evaluation Patient Details Name: Christopher Waller MRN: [id number] DOB: 1924-08-07 Today's Date: 04/12/2015    History of Present Illness Sadiq Cuff is a 82 y.o. male with history of diabetes mellitus type 2, CHF, CAD status post CABG, chronic kidney disease was brought to the ER after patient was found to have bradycardia episode of confusion sinking spell.  Found to be hypotensive and hypoglycemic.   Clinical Impression   This 82 yo male admitted with above presents to acute OT with deficits below thus affecting his ability to care for or help care for himself at home with family. He will benefit from acute OT with followup OT at SNF to work towards increased independence and decreasing burden of care on family.    Follow Up Recommendations  SNF;Supervision/Assistance - 24 hour (will need someone with him any time he is up on his feet due to increased fall risk)    Equipment Recommendations  3 in 1 bedside comode       Precautions / Restrictions Precautions Precautions: Fall Restrictions Weight Bearing Restrictions: No      Mobility Bed Mobility Overal bed mobility: Needs Assistance Bed Mobility: Supine to Sit     Supine to sit: Supervision;HOB elevated (increased time and use of rail as well)        Transfers Overall transfer level: Needs assistance Equipment used: Rolling walker (2 wheeled) Transfers: Sit to/from Stand Sit to Stand: Mod assist         General transfer comment: Pt ambulated around bed to recliner, he had one episode of decreased balance and I had to help correct with gait belt and then once at recliner he sat down without regard for closeness to chair (he really was not back close enough to safely sit I had help him shift further back with use of gait belt    Balance Overall balance assessment: Needs assistance Sitting-balance support: No upper extremity supported;Feet supported Sitting balance-Leahy Scale: Fair      Standing balance support: Bilateral upper extremity supported Standing balance-Leahy Scale: Poor Standing balance comment: reliant on UE support on RW                            ADL Overall ADL's : Needs assistance/impaired Eating/Feeding: Set up;Sitting   Grooming: Set up;Supervision/safety;Sitting   Upper Body Bathing: Set up;Supervision/ safety;Sitting   Lower Body Bathing: Maximal assistance (Mod A sit<>stand)   Upper Body Dressing : Moderate assistance;Sitting   Lower Body Dressing: Total assistance (mod A sit<>stand)   Toilet Transfer: Moderate assistance;Ambulation;RW (bed>around to recliner)   Toileting- Clothing Manipulation and Hygiene: Total assistance (mod A for sit<>stand and standing balance)                        Pertinent Vitals/Pain Pain Assessment: No/denies pain     Hand Dominance Right   Extremity/Trunk Assessment Upper Extremity Assessment Upper Extremity Assessment: Generalized weakness           Communication Communication Communication: HOH   Cognition Arousal/Alertness: Awake/alert Behavior During Therapy: WFL for tasks assessed/performed Overall Cognitive Status: No family/caregiver present to determine baseline cognitive functioning (laying in bed unaware that he had soiled himself)                                Home Living Family/patient expects to be discharged to:: Skilled nursing facility Living  Arrangements: Other relatives (nephew and wife) Available Help at Discharge: Family (unsure if 24/7 or not) Type of Home: House Home Access: Stairs to enter Entergy CorporationEntrance Stairs-Number of Steps: 1   Home Layout: One level     Bathroom Shower/Tub: Walk-in Pensions consultantshower;Curtain   Bathroom Toilet: Standard     Home Equipment: Environmental consultantWalker - 2 wheels;Shower seat   Additional Comments: Pt is a poor historian. Unsure of accuracy of home living answers. No family available at this time      Prior  Functioning/Environment Level of Independence: Needs assistance  Gait / Transfers Assistance Needed: RW for mobility ADL's / Homemaking Assistance Needed: states nephew's wife helps him bath/dress daily        OT Diagnosis: Generalized weakness;Cognitive deficits   OT Problem List: Decreased strength;Impaired balance (sitting and/or standing);Decreased cognition;Decreased coordination;Decreased safety awareness   OT Treatment/Interventions: Self-care/ADL training;Patient/family education;Balance training;Therapeutic activities;Cognitive remediation/compensation;DME and/or AE instruction    OT Goals(Current goals can be found in the care plan section) Acute Rehab OT Goals Patient Stated Goal: To return home OT Goal Formulation: With patient Time For Goal Achievement: 04/26/15 Potential to Achieve Goals: Good  OT Frequency: Min 2X/week   Barriers to D/C: Decreased caregiver support             End of Session Equipment Utilized During Treatment: Gait belt;Rolling walker Nurse Communication: Mobility status (removed foam dressing on buttocks due to soiled, Mod A for transfers would be good to have +2 for safety/lines)  Activity Tolerance: Patient tolerated treatment well Patient left: in chair;with call bell/phone within reach;with chair alarm set   Time: 1315-1349 OT Time Calculation (min): 34 min Charges:  OT General Charges $OT Visit: 1 Procedure OT Evaluation $OT Eval Moderate Complexity: 1 Procedure OT Treatments $Self Care/Home Management : 8-22 mins     Evette GeorgesLeonard, Yarenis Cerino Eva [phone] 04/12/2015, 2:05 PM

## 2015-04-12 NOTE — Progress Notes (Signed)
[place] TEAM 1 - Stepdown/ICU TEAM Progress Note  Christopher Waller ZOX:[id number] DOB: May 30, 1924 DOA: 04/10/2015 PCP: No PCP Per Patient  Admit HPI / Brief Narrative: 82 y.o. male PMHx DM Type 2,  Chronic Systolic CHF, Dilated Cardiomyopathy, Pulmonary HTN, , CAD S/P  CABG, S/P Biventricular implantable cardioverter-defibrillator medtronic,CKD   Brought to the ER after patient was found to have bradycardia episode of confusion sinking spell. Patient denies losing consciousness. Patient's blood sugar was found to be in 30s and was brought to the ER. In the ER patient was given D50 and patient's blood sugar slowly improved. Patient denies any chest pain shortness of breath nausea vomiting or diarrhea. In the ER patient was found to be hypotensive and improved with 2 L normal saline bolus. Patient does not look septic. Patient will be admitted for further observation.    HPI/Subjective: 1/13 A/O 4 States has had a cold with positive productive cough (clear), negative SOB negative diaphoresis negative chest pain.   Assessment/Plan: Syncope and collapse -Most likely secondary to hypoglycemia -PT/OT recommends home health/SNF; however will need to await Adult Protective Services recommendations -However given patient's cardiac history will also obtain echocardiogram  Hypoglycemia in a patient with diabetes mellitus type 2  - cause not clear.  -Patient states he has been eating well.  - Patient lives with his nephew and at this time unable to reach his nephew. Note per nursing upon admission patient was wearing 3 soiled diapers and was very unKept  Adult Protective Services contacted - At this time holding patient's glipizide and Januvia and we will closely follow CBGs every 2 hourly.  CAD native artery  -S/P CABG - denies any chest pain. -S/P AICD placement   Systolic CHF/ischemic cardiomyopathy - last measured EF 35-40% -Strict in and out -Daily weight  Pulmonary Hypertension  severe -See systolic CHF  Hypotension  - probably from dehydration and poor oral intake. Holding off antihypertensive and gently hydrating. Caution that patient has history of systolic heart failure.  -Follow lactic acid levels blood cultures and procalcitonin levels. -Normal saline 35ml/hr -Orthostatic vitals in Am  Cough with rhonchi -PCXR in A.m.  Acute on chronic renal failure  -probably from dehydration and hypotension - gently hydrate and hold antihypertensives including lisinopril.  Dementia  - Depakote levels. Pending -Continue Depakote 250 mg TID    Code Status: Partial Family Communication: no family present at time of exam Disposition Plan: SNF?    Consultants:   Procedure/Significant Events: 01/04/2015 echocardiogram- Left ventricle:  mild LVH. -LVEF= 35% to 40%. - Aortic valve: moderate. - Left atrium: mildly to moderately dilated.- Right ventricle: mildly dilated.  - Right atrium: mildly to moderately dilated. - Pulmonary arteries: PA peak pressure: 52 mm Hg (S). 1/12 PCXR; No acute cardiopulmonary process 1/13 echocardiogram;- LVEF=50% to 55%. - Ventricular septum: The contour showed diastolic flattening and systolic flattening. - Aortic valve: Moderate aortic stenosis.- Left atrium: Moderately dilated - Right ventricle: moderately dilated.  - Tricuspid valve: moderate regurgitation. - Pulmonary arteries: PA peak pressure: 53 mm Hg (S).    Culture NA  Antibiotics: NA  DVT prophylaxis: Lovenox   Devices NA   LINES / TUBES:  NA    Continuous Infusions: . sodium chloride 40 mL/hr at 04/11/15 1930    Objective: VITAL SIGNS: Temp: 98.2 F (36.8 C) (01/13 1600) Temp Source: Oral (01/13 1600) BP: 110/53 mmHg (01/13 1600) Pulse Rate: 72 (01/13 1600) SPO2; FIO2:   Intake/Output Summary (Last 24 hours) at 04/12/15 1945 Last data  filed at 04/12/15 1900  Gross per 24 hour  Intake    900 ml  Output    450 ml  Net    450 ml      Exam: General: A/O 4 , No acute respiratory distress Eyes: Negative headache, negative scleral hemorrhage ENT: Negative Runny negative gingival bleeding, Neck:  Negative scars, masses, torticollis, lymphadenopathy, JVD Lungs: positive right lung rhonchi, Clear to auscultation left lung fields, without wheezes or crackles Cardiovascular: Regular rate and rhythm without murmur gallop or rub normal S1 and S2 Abdomen:negative abdominal pain, nondistended, positive soft, bowel sounds, no rebound, no ascites, no appreciable mass Extremities: No significant cyanosis, clubbing, or edema bilateral lower extremities Psychiatric:  Negative depression, negative anxiety, negative fatigue, negative mania  Neurologic:  Cranial nerves II through XII intact, tongue/uvula midline, all extremities muscle strength 5/5, sensation intact throughout, negative dysarthria, negative expressive aphasia, negative receptive aphasia.   Data Reviewed: Basic Metabolic Panel:  Recent Labs Lab 04/10/15 1833 04/11/15 0312 04/12/15 0233  NA 141 142 143  K 4.3 4.4 4.3  CL 103 112* 113*  CO2 24 24 25   GLUCOSE 39* 125* 94  BUN 60* 52* 39*  CREATININE 1.75* 1.56* 1.18  CALCIUM 9.0 8.0* 8.2*  MG  --   --  2.2   Liver Function Tests:  Recent Labs Lab 04/10/15 1833 04/11/15 0312 04/12/15 0233  AST 38 38 27  ALT 22 21 17   ALKPHOS 38 31* 27*  BILITOT 0.5 0.7 0.5  PROT 6.3* 5.3* 4.6*  ALBUMIN 3.1* 2.5* 2.2*   No results for input(s): LIPASE, AMYLASE in the last 168 hours.  Recent Labs Lab 04/10/15 1834  AMMONIA 20   CBC:  Recent Labs Lab 04/10/15 1833 04/11/15 0312 04/12/15 0233  WBC 5.6 4.1 6.6  NEUTROABS 3.7 2.3 4.3  HGB 12.1* 10.4* 9.3*  HCT 37.2* 33.0* 28.8*  MCV 96.6 96.8 97.6  PLT 142* 126* 115*   Cardiac Enzymes: No results for input(s): CKTOTAL, CKMB, CKMBINDEX, TROPONINI in the last 168 hours. BNP (last 3 results) No results for input(s): BNP in the last 8760 hours.  ProBNP  (last 3 results) No results for input(s): PROBNP in the last 8760 hours.  CBG:  Recent Labs Lab 04/12/15 0400 04/12/15 0424 04/12/15 0806 04/12/15 1229 04/12/15 1659  GLUCAP 64* 87 159* 150* 157*    Recent Results (from the past 240 hour(s))  Culture, blood (routine x 2)     Status: None (Preliminary result)   Collection Time: 04/11/15 12:45 AM  Result Value Ref Range Status   Specimen Description BLOOD LEFT ARM  Final   Special Requests AEROBIC BOTTLE ONLY 10ML  Final   Culture NO GROWTH 1 DAY  Final   Report Status PENDING  Incomplete  Culture, blood (routine x 2)     Status: None (Preliminary result)   Collection Time: 04/11/15 12:50 AM  Result Value Ref Range Status   Specimen Description BLOOD LEFT ARM  Final   Special Requests AEROBIC BOTTLE ONLY 10ML  Final   Culture NO GROWTH 1 DAY  Final   Report Status PENDING  Incomplete  MRSA PCR Screening     Status: None   Collection Time: 04/11/15  4:07 AM  Result Value Ref Range Status   MRSA by PCR NEGATIVE NEGATIVE Final    Comment:        The GeneXpert MRSA Assay (FDA approved for NASAL specimens only), is one component of a comprehensive MRSA colonization surveillance program. It is  not intended to diagnose MRSA infection nor to guide or monitor treatment for MRSA infections.      Studies:  Recent x-ray studies have been reviewed in detail by the Attending Physician  Scheduled Meds:  Scheduled Meds: . aspirin EC  81 mg Oral Q breakfast  . divalproex  250 mg Oral 3 times per day  . enoxaparin (LOVENOX) injection  30 mg Subcutaneous Daily  . fenofibrate  160 mg Oral Daily    Time spent on care of this patient: 40 mins   Deana Krock, Roselind Messier , MD  [facility] Office  [phone] Pager [phone]  On-Call/Text Page:      [email or web address]      password [email or web address]  If 7PM-7AM, please contact night-coverage [email or web address] Password [email or web address] 04/12/2015, 7:45 PM   LOS: 1 day   Care during the described  time interval was provided by me .  I have reviewed this patient's available data, including medical history, events of note, physical examination, and all test results as part of my evaluation. I have personally reviewed and interpreted all radiology studies.   Carolyne Littles, MD [phone] Pager

## 2015-04-12 NOTE — Progress Notes (Signed)
Echocardiogram 2D Echocardiogram has been performed.  Dorothey BasemanReel, Gaelen Brager M 04/12/2015, 10:09 AM

## 2015-04-13 ENCOUNTER — Inpatient Hospital Stay (HOSPITAL_COMMUNITY): Payer: Medicare Other

## 2015-04-13 LAB — COMPREHENSIVE METABOLIC PANEL
ALBUMIN: 2.1 g/dL — AB (ref 3.5–5.0)
ALT: 17 U/L (ref 17–63)
ANION GAP: 4 — AB (ref 5–15)
AST: 23 U/L (ref 15–41)
Alkaline Phosphatase: 29 U/L — ABNORMAL LOW (ref 38–126)
BILIRUBIN TOTAL: 0.5 mg/dL (ref 0.3–1.2)
BUN: 28 mg/dL — ABNORMAL HIGH (ref 6–20)
CALCIUM: 8.2 mg/dL — AB (ref 8.9–10.3)
CO2: 26 mmol/L (ref 22–32)
Chloride: 113 mmol/L — ABNORMAL HIGH (ref 101–111)
Creatinine, Ser: 1.02 mg/dL (ref 0.61–1.24)
GLUCOSE: 82 mg/dL (ref 65–99)
POTASSIUM: 4.2 mmol/L (ref 3.5–5.1)
Sodium: 143 mmol/L (ref 135–145)
TOTAL PROTEIN: 4.8 g/dL — AB (ref 6.5–8.1)

## 2015-04-13 LAB — CBC WITH DIFFERENTIAL/PLATELET
BASOS ABS: 0 10*3/uL (ref 0.0–0.1)
BASOS PCT: 1 %
Eosinophils Absolute: 0.2 10*3/uL (ref 0.0–0.7)
Eosinophils Relative: 4 %
HEMATOCRIT: 28.3 % — AB (ref 39.0–52.0)
HEMOGLOBIN: 9.2 g/dL — AB (ref 13.0–17.0)
LYMPHS PCT: 29 %
Lymphs Abs: 1.3 10*3/uL (ref 0.7–4.0)
MCH: 31.8 pg (ref 26.0–34.0)
MCHC: 32.5 g/dL (ref 30.0–36.0)
MCV: 97.9 fL (ref 78.0–100.0)
Monocytes Absolute: 0.6 10*3/uL (ref 0.1–1.0)
Monocytes Relative: 13 %
NEUTROS ABS: 2.3 10*3/uL (ref 1.7–7.7)
NEUTROS PCT: 53 %
Platelets: 98 10*3/uL — ABNORMAL LOW (ref 150–400)
RBC: 2.89 MIL/uL — AB (ref 4.22–5.81)
RDW: 13.9 % (ref 11.5–15.5)
WBC: 4.3 10*3/uL (ref 4.0–10.5)

## 2015-04-13 LAB — MAGNESIUM: Magnesium: 2.1 mg/dL (ref 1.7–2.4)

## 2015-04-13 LAB — GLUCOSE, CAPILLARY
GLUCOSE-CAPILLARY: 104 mg/dL — AB (ref 65–99)
GLUCOSE-CAPILLARY: 66 mg/dL (ref 65–99)
GLUCOSE-CAPILLARY: 85 mg/dL (ref 65–99)
GLUCOSE-CAPILLARY: 89 mg/dL (ref 65–99)
GLUCOSE-CAPILLARY: 98 mg/dL (ref 65–99)
Glucose-Capillary: 74 mg/dL (ref 65–99)
Glucose-Capillary: 92 mg/dL (ref 65–99)

## 2015-04-13 LAB — PROCALCITONIN

## 2015-04-13 MED ORDER — ASPIRIN EC 81 MG PO TBEC
81.0000 mg | DELAYED_RELEASE_TABLET | Freq: Every day | ORAL | Status: DC
  Administered 2015-04-13 – 2015-04-15 (×3): 81 mg via ORAL
  Filled 2015-04-13 (×3): qty 1

## 2015-04-13 NOTE — Progress Notes (Signed)
[place] TEAM 1 - Stepdown/ICU TEAM Progress Note  Taim Fontanella ZOX:[id number] DOB: 1925/03/06 DOA: 04/10/2015 PCP: No PCP Per Patient  Admit HPI / Brief Narrative: 82 y.o. male PMHx DM Type 2,  Chronic Systolic CHF, Dilated Cardiomyopathy, Pulmonary HTN, , CAD S/P  CABG, S/P Biventricular implantable cardioverter-defibrillator medtronic,CKD   Brought to the ER after patient was found to have bradycardia episode of confusion sinking spell. Patient denies losing consciousness. Patient's blood sugar was found to be in 30s and was brought to the ER. In the ER patient was given D50 and patient's blood sugar slowly improved. Patient denies any chest pain shortness of breath nausea vomiting or diarrhea. In the ER patient was found to be hypotensive and improved with 2 L normal saline bolus. Patient does not look septic. Patient will be admitted for further observation.    HPI/Subjective: 1/14 A/O 4, however patient's short-term memory clearly compromised. Patient continues to state wishes to return home and not go to an SNF. However after spending 30 minutes explaining we were waiting to hear from Adult Protective Services, patient asked the same question again why he could not go home. negative SOB negative diaphoresis negative chest pain.   Assessment/Plan: Syncope and collapse -Most likely secondary to hypoglycemia -PT/OT recommends home health/SNF; however will need to await Adult Protective Services recommendations -Echocardiogram; confirmed cardiomyopathy and pulmonary hypertension see results below  Hypoglycemia in a patient with diabetes mellitus type 2  - cause not clear. Neglect? Forgetfulness on the part of patient? -Patient states he has been eating well.  - Patient lives with his nephew and at this time unable to reach his nephew. Note per nursing upon admission patient was wearing 3 soiled diapers and was very unKept  Adult Protective Services contacted - At this time holding  patient's glipizide and Januvia. Would not restart patient on Glipizide  CAD native artery  -S/P CABG - denies any chest pain. -S/P AICD placement   Systolic CHF/ischemic cardiomyopathy - last measured EF 35-40% -Strict in and out since admission +3.1 L -Daily weight                                1/14 bed weight= 67.9 kg  Pulmonary Hypertension severe -See systolic CHF  Hypotension  - probably from dehydration and poor oral intake. Holding off antihypertensive and gently hydrating. Caution that patient has history of systolic heart failure.  -Patient's BP remain soft would not discharge on BP medication -Normal saline 7940ml/hr -Orthostatic vitals in Am  Cough with rhonchi -PCXR in A.m.  Acute on chronic renal failure  -probably from dehydration and hypotension - gently hydrate and hold antihypertensives including lisinopril.  Dementia  - Depakote levels. Pending -Continue Depakote 250 mg TID  Goals of care -CSW Lupita LeashDonna and CSW Minerva Areolaric have attempted on multiple occasions today to reach nephew unsuccessfully. Need to discuss disposition plans. -Do not believe patient has capacity. Consult psychiatry in a.m. for evaluation of competency.     Code Status: Partial Family Communication: no family present at time of exam Disposition Plan: SNF?    Consultants:   Procedure/Significant Events: 01/04/2015 echocardiogram- Left ventricle:  mild LVH. -LVEF= 35% to 40%. - Aortic valve: moderate. - Left atrium: mildly to moderately dilated.- Right ventricle: mildly dilated.  - Right atrium: mildly to moderately dilated. - Pulmonary arteries: PA peak pressure: 52 mm Hg (S). 1/12 PCXR; No acute cardiopulmonary process 1/13 echocardiogram;- LVEF=50% to 55%. -  Ventricular septum: The contour showed diastolic flattening and systolic flattening. - Aortic valve: Moderate aortic stenosis.- Left atrium: Moderately dilated - Right ventricle: moderately dilated.  - Tricuspid valve: moderate  regurgitation. - Pulmonary arteries: PA peak pressure: 53 mm Hg (S).    Culture NA  Antibiotics: NA  DVT prophylaxis: Lovenox   Devices NA   LINES / TUBES:  NA    Continuous Infusions: . sodium chloride 40 mL/hr at 04/12/15 2000    Objective: VITAL SIGNS: Temp: 97.4 F (36.3 C) (01/14 0700) Temp Source: Oral (01/14 0700) BP: 108/69 mmHg (01/14 0700) Pulse Rate: 70 (01/14 0700) SPO2; FIO2:   Intake/Output Summary (Last 24 hours) at 04/13/15 1206 Last data filed at 04/13/15 0900  Gross per 24 hour  Intake   1440 ml  Output    825 ml  Net    615 ml     Exam: General: A/O 4 , but confused, No acute respiratory distress Eyes: Negative headache, negative scleral hemorrhage ENT: Negative Runny negative gingival bleeding, Neck:  Negative scars, masses, torticollis, lymphadenopathy, JVD Lungs: positive right lung rhonchi (improved), Clear to auscultation left lung fields, without wheezes or crackles Cardiovascular: Regular rate and rhythm without murmur gallop or rub normal S1 and S2 Abdomen:negative abdominal pain, nondistended, positive soft, bowel sounds, no rebound, no ascites, no appreciable mass Extremities: No significant cyanosis, clubbing, or edema bilateral lower extremities Psychiatric:  Negative depression, negative anxiety, negative fatigue, negative mania  Neurologic:  Cranial nerves II through XII intact, tongue/uvula midline, all extremities muscle strength 5/5, sensation intact throughout, negative dysarthria, negative expressive aphasia, negative receptive aphasia.   Data Reviewed: Basic Metabolic Panel:  Recent Labs Lab 04/10/15 1833 04/11/15 0312 04/12/15 0233 04/13/15 0332  NA 141 142 143 143  K 4.3 4.4 4.3 4.2  CL 103 112* 113* 113*  CO2 24 24 25 26   GLUCOSE 39* 125* 94 82  BUN 60* 52* 39* 28*  CREATININE 1.75* 1.56* 1.18 1.02  CALCIUM 9.0 8.0* 8.2* 8.2*  MG  --   --  2.2 2.1   Liver Function Tests:  Recent Labs Lab  04/10/15 1833 04/11/15 0312 04/12/15 0233 04/13/15 0332  AST 38 38 27 23  ALT 22 21 17 17   ALKPHOS 38 31* 27* 29*  BILITOT 0.5 0.7 0.5 0.5  PROT 6.3* 5.3* 4.6* 4.8*  ALBUMIN 3.1* 2.5* 2.2* 2.1*   No results for input(s): LIPASE, AMYLASE in the last 168 hours.  Recent Labs Lab 04/10/15 1834  AMMONIA 20   CBC:  Recent Labs Lab 04/10/15 1833 04/11/15 0312 04/12/15 0233 04/13/15 0332  WBC 5.6 4.1 6.6 4.3  NEUTROABS 3.7 2.3 4.3 2.3  HGB 12.1* 10.4* 9.3* 9.2*  HCT 37.2* 33.0* 28.8* 28.3*  MCV 96.6 96.8 97.6 97.9  PLT 142* 126* 115* 98*   Cardiac Enzymes: No results for input(s): CKTOTAL, CKMB, CKMBINDEX, TROPONINI in the last 168 hours. BNP (last 3 results) No results for input(s): BNP in the last 8760 hours.  ProBNP (last 3 results) No results for input(s): PROBNP in the last 8760 hours.  CBG:  Recent Labs Lab 04/12/15 2037 04/13/15 0049 04/13/15 0452 04/13/15 0538 04/13/15 0805  GLUCAP 144* 98 66 89 74    Recent Results (from the past 240 hour(s))  Culture, blood (routine x 2)     Status: None (Preliminary result)   Collection Time: 04/11/15 12:45 AM  Result Value Ref Range Status   Specimen Description BLOOD LEFT ARM  Final   Special Requests AEROBIC  BOTTLE ONLY  Final   Culture NO GROWTH 1 DAY  Final   Report Status PENDING  Incomplete  Culture, blood (routine x 2)     Status: None (Preliminary result)   Collection Time: 04/11/15 12:50 AM  Result Value Ref Range Status   Specimen Description BLOOD LEFT ARM  Final   Special Requests AEROBIC BOTTLE ONLY  Final   Culture NO GROWTH 1 DAY  Final   Report Status PENDING  Incomplete  MRSA PCR Screening     Status: None   Collection Time: 04/11/15  4:07 AM  Result Value Ref Range Status   MRSA by PCR NEGATIVE NEGATIVE Final    Comment:        The GeneXpert MRSA Assay (FDA approved for NASAL specimens only), is one component of a comprehensive MRSA colonization surveillance program. It is  not intended to diagnose MRSA infection nor to guide or monitor treatment for MRSA infections.      Studies:  Recent x-ray studies have been reviewed in detail by the Attending Physician  Scheduled Meds:  Scheduled Meds: . aspirin EC  81 mg Oral Q breakfast  . divalproex  250 mg Oral 3 times per day  . enoxaparin (LOVENOX) injection  30 mg Subcutaneous Daily  . fenofibrate  160 mg Oral Daily    Time spent on care of this patient: 40 mins   Elizebeth Kluesner, Roselind Messier , MD  [facility] Office  [phone] Pager [phone]  On-Call/Text Page:      [email or web address]      password [email or web address]  If 7PM-7AM, please contact night-coverage [email or web address] Password [email or web address] 04/13/2015, 12:06 PM   LOS: 2 days   Care during the described time interval was provided by me .  I have reviewed this patient's available data, including medical history, events of note, physical examination, and all test results as part of my evaluation. I have personally reviewed and interpreted all radiology studies.   Carolyne Littles, MD [phone] Pager

## 2015-04-14 DIAGNOSIS — F0390 Unspecified dementia without behavioral disturbance: Secondary | ICD-10-CM

## 2015-04-14 DIAGNOSIS — E162 Hypoglycemia, unspecified: Secondary | ICD-10-CM

## 2015-04-14 LAB — MAGNESIUM: Magnesium: 2 mg/dL (ref 1.7–2.4)

## 2015-04-14 LAB — COMPREHENSIVE METABOLIC PANEL
ALBUMIN: 2.3 g/dL — AB (ref 3.5–5.0)
ALT: 18 U/L (ref 17–63)
ANION GAP: 4 — AB (ref 5–15)
AST: 27 U/L (ref 15–41)
Alkaline Phosphatase: 34 U/L — ABNORMAL LOW (ref 38–126)
BILIRUBIN TOTAL: 0.5 mg/dL (ref 0.3–1.2)
BUN: 22 mg/dL — ABNORMAL HIGH (ref 6–20)
CHLORIDE: 112 mmol/L — AB (ref 101–111)
CO2: 25 mmol/L (ref 22–32)
Calcium: 8.4 mg/dL — ABNORMAL LOW (ref 8.9–10.3)
Creatinine, Ser: 0.91 mg/dL (ref 0.61–1.24)
GFR calc Af Amer: >60 mL/min (ref >60)
Glucose, Bld: 90 mg/dL (ref 65–99)
POTASSIUM: 4.2 mmol/L (ref 3.5–5.1)
Sodium: 141 mmol/L (ref 135–145)
TOTAL PROTEIN: 5.2 g/dL — AB (ref 6.5–8.1)

## 2015-04-14 LAB — GLUCOSE, CAPILLARY
GLUCOSE-CAPILLARY: 90 mg/dL (ref 65–99)
GLUCOSE-CAPILLARY: 82 mg/dL (ref 65–99)
GLUCOSE-CAPILLARY: 147 mg/dL — AB (ref 65–99)
GLUCOSE-CAPILLARY: 123 mg/dL — AB (ref 65–99)
Glucose-Capillary: 109 mg/dL — ABNORMAL HIGH (ref 65–99)
Glucose-Capillary: 91 mg/dL (ref 65–99)
Glucose-Capillary: 97 mg/dL (ref 65–99)

## 2015-04-14 LAB — CBC WITH DIFFERENTIAL/PLATELET
BASOS ABS: 0 10*3/uL (ref 0.0–0.1)
Basophils Relative: 0 %
Eosinophils Absolute: 0.2 10*3/uL (ref 0.0–0.7)
Eosinophils Relative: 4 %
HEMATOCRIT: 30.3 % — AB (ref 39.0–52.0)
HEMOGLOBIN: 10.1 g/dL — AB (ref 13.0–17.0)
LYMPHS ABS: 1.2 10*3/uL (ref 0.7–4.0)
LYMPHS PCT: 26 %
MCH: 32.6 pg (ref 26.0–34.0)
MCHC: 33.3 g/dL (ref 30.0–36.0)
MCV: 97.7 fL (ref 78.0–100.0)
Monocytes Absolute: 0.7 10*3/uL (ref 0.1–1.0)
Monocytes Relative: 14 %
NEUTROS ABS: 2.6 10*3/uL (ref 1.7–7.7)
Neutrophils Relative %: 56 %
Platelets: 114 10*3/uL — ABNORMAL LOW (ref 150–400)
RBC: 3.1 MIL/uL — AB (ref 4.22–5.81)
RDW: 14 % (ref 11.5–15.5)
WBC: 4.7 10*3/uL (ref 4.0–10.5)

## 2015-04-14 MED ORDER — INFLUENZA VAC SPLIT QUAD 0.5 ML IM SUSY
0.5000 mL | PREFILLED_SYRINGE | INTRAMUSCULAR | Status: AC
  Administered 2015-04-15: 0.5 mL via INTRAMUSCULAR
  Filled 2015-04-14: qty 0.5

## 2015-04-14 MED ORDER — PNEUMOCOCCAL VAC POLYVALENT 25 MCG/0.5ML IJ INJ
0.5000 mL | INJECTION | INTRAMUSCULAR | Status: DC
  Filled 2015-04-14: qty 0.5

## 2015-04-14 NOTE — Clinical Social Work Note (Addendum)
CSW spoke with patient's nephew Christopher Waller at [phone] who stated they have been taking care of patient for over 10 years.  Patient's nephew reported they were living next door to patient in [place] and as he became older and had more health care issues, they started to help take care of him.  Christopher Waller reported that they were living in [place] and moved to [place] about 7 months ago.  Patient's nephew stated that his wife helps with bathing patient twice a day, helps with meals, and his medicines.  Patient's nephew stated that [facility] and he was there for about a week and felt like the therapy really helped the patient.  Patient's family stated they were pleased with the care and therapy that he received.  Patient's nephew stated they are open to him returning to SNF for short term rehab, but they are also thinking about having him return back home with home health.  Patient's nephew is going to discuss the situation with his wife so they can make a decision and call CSW back on Monday.  Patient's nephew Christopher Waller requested that physician call him at [phone] Monday morning to give an update on patient's progress, CSW updated bedside nurse on patient's nephew's request.  Patient's nephew gave CSW permission to begin SNF search process.  CSW to continue to follow patient's progress.  [name], MSW, [name] [phone] 04/14/2015 5:10 PM

## 2015-04-14 NOTE — Progress Notes (Signed)
Patient can stand for brief interval using walker and steadying suppport of tech, while RN performs pericare. He is unable to walk in room and was extremely apprehensive that he would fall.

## 2015-04-14 NOTE — [place] FL2 (Signed)
MEDICAID FL2 LEVEL OF CARE SCREENING TOOL     IDENTIFICATION  Patient Name: Christopher AltDominick Peed Birthdate: 30-Mar-1925 Sex: male Admission Date (Current Location): 04/10/2015  [facility] [place]   Facility and Address:  [facility] [place], [address], [place], [place] [address]      Provider Number: [id number]  Attending Physician Name and Address:  Drema Dallasurtis J Woods, MD  Relative Name and Phone Number:  Anola GurneyVincent Autuore nephew [phone]    Current Level of Care: SNF Recommended Level of Care: Skilled Nursing Facility Prior Approval Number:    Date Approved/Denied:   PASRR Number: [id number]  Discharge Plan: SNF    Current Diagnoses: Patient Active Problem List   Diagnosis Date Noted  . Arterial hypotension   . Syncope and collapse   . Cardiomyopathy, ischemic   . Pulmonary hypertension (HCC)   . AKI (acute kidney injury) (HCC) 04/11/2015  . Hypoglycemia 04/11/2015  . Hypotension 04/11/2015  . Hypoglycemia associated with diabetes (HCC)   . Acute on chronic renal failure (HCC)   . Dementia   . Acute renal failure (HCC)   . Biventricular implantable cardioverter-defibrillator medtronic  01/05/2015  . Ischemic cardiomyopathy 01/05/2015  . Chronic systolic CHF (congestive heart failure) (HCC)   . CKD (chronic kidney disease)   . Dyslipidemia   . Myoclonic jerking 01/04/2015  . Hypnagogic jerks 01/04/2015    Orientation RESPIRATION BLADDER Height & Weight    Self, Time, Place  Normal Incontinent 5\' 6"  (167.6 cm) 147 lbs.  BEHAVIORAL SYMPTOMS/MOOD NEUROLOGICAL BOWEL NUTRITION STATUS      Incontinent Diet (Carb Modified)  AMBULATORY STATUS COMMUNICATION OF NEEDS Skin   Limited Assist Verbally PU Stage and Appropriate Care   PU Stage 2 Dressing: Daily                   Personal Care Assistance Level of Assistance  Bathing, Dressing Bathing Assistance: Limited assistance   Dressing Assistance: Limited  assistance     Functional Limitations Info             SPECIAL CARE FACTORS FREQUENCY  PT (By licensed PT), OT (By licensed OT)     PT Frequency: 5x a week OT Frequency: 5x a week            Contractures      Additional Factors Info  Code Status, Allergies Code Status Info: Partial Allergies Info: No Known Allergies           Current Medications (04/14/2015):  This is the current hospital active medication list Current Facility-Administered Medications  Medication Dose Route Frequency Provider Last Rate Last Dose  . acetaminophen (TYLENOL) tablet 650 mg  650 mg Oral Q6H PRN Eduard ClosArshad N Kakrakandy, MD       Or  . acetaminophen (TYLENOL) suppository 650 mg  650 mg Rectal Q6H PRN Eduard ClosArshad N Kakrakandy, MD      . aspirin EC tablet 81 mg  81 mg Oral Q breakfast Drema Dallasurtis J Woods, MD   81 mg at 04/14/15 0800  . divalproex (DEPAKOTE SPRINKLE) capsule 250 mg  250 mg Oral 3 times per day Eduard ClosArshad N Kakrakandy, MD   250 mg at 04/14/15 1400  . enoxaparin (LOVENOX) injection 30 mg  30 mg Subcutaneous Daily Eduard ClosArshad N Kakrakandy, MD   30 mg at 04/14/15 0921  . fenofibrate tablet 160 mg  160 mg Oral Daily Eduard ClosArshad N Kakrakandy, MD   160 mg at 04/14/15 0921  . [START  ON 04/15/2015] Influenza vac split quadrivalent PF (FLUARIX) injection 0.5 mL  0.5 mL Intramuscular Tomorrow-1000 Drema Dallas, MD      . ondansetron [facility]) tablet 4 mg  4 mg Oral Q6H PRN [name], MD       Or  . ondansetron [facility]) injection 4 mg  4 mg Intravenous Q6H PRN [name], MD      . Melene Muller ON 04/15/2015] pneumococcal 23 valent vaccine (PNU-IMMUNE) injection 0.5 mL  0.5 mL Intramuscular Tomorrow-1000 Drema Dallas, MD         Discharge Medications: Please see discharge summary for a list of discharge medications.  Relevant Imaging Results:  Relevant Lab Results:   Additional Information    Jerilee Space, Ervin Knack, LCSWA

## 2015-04-14 NOTE — Clinical Social Work Note (Signed)
Clinical Social Work Assessment  Patient Details  Name: Christopher Waller MRN: [id number] Date of Birth: 03-22-1925  Date of referral:  04/14/15               Reason for consult:  Facility Placement                Permission sought to share information with:  Facility Sport and exercise psychologist, Family Supports Permission granted to share information::  Yes, Verbal Permission Granted  Name::     Evette Doffing patient's nephew at [phone]  Agency::  SNF admissions  Relationship::     Contact Information:     Housing/Transportation Living arrangements for the past 2 months:  Single Family Home Source of Information:  Patient, Other (Comment Required) (Patient's nephew) Patient Interpreter Needed:  None Criminal Activity/Legal Involvement Pertinent to Current Situation/Hospitalization:  No - Comment as needed Significant Relationships:  Other Family Members Lives with:  Relatives Do you feel safe going back to the place where you live?  Yes (Patient expressed that he would like to return back home, patient's nephew is open to patient going to SNF for short term rehab.) Need for family participation in patient care:  Yes (Comment) (Patient has some dementia and psych feels patient does not have capacity)  Care giving concerns:  Patient feels he can return back home with his nephew, but his nephew is open to patient going to short term rehab to gain his strength back up.  Social Worker assessment / plan:  Patient is an 82 year old male who lives with his nephew and his wife.  Patient and nephew reported that they recently moved to [facility] from [place] about   Patient has some dementia, but is pleasant to talk to, patient expressed that he would like to return back home, but psych saw patient and decided he does not have capacity to make decisions at this time.  CSW spoke to patient's nephew Evette Doffing [phone] to discuss PT recommendations for 24 hour supervision with home health verse going  to a SNF for short term rehab.  Patient's nephew expressed that patient has been to [facility] for short term rehab in the past and they are open to him returning back to the SNF.  Patient's nephew expressed that patient has been living with them for the past 10 years and they help take care of him and make sure his needs are being met.  Patient's son reports that patient is bathed two or three times a day, and they use three attends to help with absorption for patient due to his incontinence.  Patient's nephew expressed that he works and it can be difficult to get a hold of him, but he is available via voice mail.  Patient's nephew stated he did not have any other questions and gave CSW permission to fax out patient to SNFs.  Employment status:  Retired Forensic scientist:  Information systems manager, [employer] [place] PT Recommendations:  [place], Home with [place] / Referral to community resources:  [place]  Patient/Family's Response to care:  Patient's family agreeable to going to SNF  Patient/Family's Understanding of and Emotional Response to Diagnosis, Current Treatment, and Prognosis:  Patient's family aware of current diagnosis and treatment plan.  Emotional Assessment Appearance:  Appears stated age Attitude/Demeanor/Rapport:    Affect (typically observed):  Appropriate, Calm, Pleasant Orientation:  Oriented to Place, Oriented to  Time, Oriented to Self Alcohol / Substance use:  Not Applicable Psych involvement (Current and /or  in the community):  No (Comment), Yes (Comment) (Psych saw patient to determine his capacity)  Discharge Needs  Concerns to be addressed:  Decision making concerns Readmission within the last 30 days:    Current discharge risk:  None Barriers to Discharge:  Continued Medical Work up   Anell Barr 04/14/2015, 5:29 PM

## 2015-04-14 NOTE — Progress Notes (Signed)
[facility] TEAM 1 - Stepdown/ICU TEAM Progress Note  Christopher Waller ZOX:[id number] DOB: 1924/12/23 DOA: 04/10/2015 PCP: No PCP Per Patient  Admit HPI / Brief Narrative: 82 y.o. male PMHx DM Type 2,  Chronic Systolic CHF, Dilated Cardiomyopathy, Pulmonary HTN, , CAD S/P  CABG, S/P Biventricular implantable cardioverter-defibrillator medtronic,CKD   Brought to the ER after patient was found to have bradycardia episode of confusion sinking spell. Patient denies losing consciousness. Patient's blood sugar was found to be in 30s and was brought to the ER. In the ER patient was given D50 and patient's blood sugar slowly improved. Patient denies any chest pain shortness of breath nausea vomiting or diarrhea. In the ER patient was found to be hypotensive and improved with 2 L normal saline bolus. Patient does not look septic. Patient will be admitted for further observation.    HPI/Subjective: 1/15 A/O 4, however patient's short-term memory clearly compromised. Patient does not recall a long discussion yesterday concerning possible reason unable to discharge home. continues to state wishes to return home and not go to an SNF. However after spending 30 minutes explaining we were waiting to hear from Adult Protective Services, patient asked the same question again why he could not go home. negative SOB negative diaphoresis negative chest pain.   Assessment/Plan: Syncope and collapse -Most likely secondary to hypoglycemia -PT/OT recommends home health/SNF; however will need to await Adult Protective Services recommendations -Echocardiogram; confirmed cardiomyopathy and pulmonary hypertension see results below -Ambulate patient q shift  Hypoglycemia in a patient with diabetes mellitus type 2  - Cause not clear. Neglect? Forgetfulness on the part of patient? -Patient states he has been eating well.  - Patient lives with his nephew and at this time unable to reach his nephew. Note per nursing upon  admission patient was wearing 3 soiled diapers and was very unKept  Adult Protective Services contacted - At this time holding patient's glipizide and Januvia. Would not restart patient on Glipizide secondary to high risk of future hypoglycemic episodes  CAD native artery  -S/P CABG - denies any chest pain. -S/P AICD placement   Systolic CHF/ischemic cardiomyopathy - last measured EF 35-40% -Strict in and out since admission + 3.6 L -Daily weight                                1/15 bed weight= 67 kg  Pulmonary Hypertension severe -See systolic CHF  Hypotension  - probably from dehydration and poor oral intake. Holding off antihypertensive and gently hydrating. Caution that patient has history of systolic heart failure.  -Patient's BP remain soft would not discharge on BP medication -1/15 Orthostatic vitals; negative for orthostatic hypotension -Would not restart BP medication at this time  Cough with rhonchi -resolving. PCXR shows no active disease process.  Acute on chronic renal failure  -probably from dehydration and hypotension - Resolved.  Dementia  - 1/13 Depakote levels 33ug/ml which is low. Asymptomatic will not increase dosing. -Continue Depakote 250 mg TID  Goals of care -1/14 CSW Lupita Leash and CSW Minerva Areola have attempted on multiple occasions today to reach nephew unsuccessfully. Need to discuss disposition plans. -1/15 Do not believe patient has capacity. Consulted Psychiatry today for evaluation of competency. In addition spoke to CSW Freddy Jaksch, can patient refused and return to home vs SNF?     Code Status: Partial Family Communication: no family present at time of exam Disposition Plan: SNF?    Consultants:  Procedure/Significant Events: 01/04/2015 echocardiogram- Left ventricle:  mild LVH. -LVEF= 35% to 40%. - Aortic valve: moderate. - Left atrium: mildly to moderately dilated.- Right ventricle: mildly dilated.  - Right atrium: mildly to moderately  dilated. - Pulmonary arteries: PA peak pressure: 52 mm Hg (S). 1/12 PCXR; No acute cardiopulmonary process 1/13 echocardiogram;- LVEF=50% to 55%. - Ventricular septum: The contour showed diastolic flattening and systolic flattening. - Aortic valve: Moderate aortic stenosis.- Left atrium: Moderately dilated - Right ventricle: moderately dilated.  - Tricuspid valve: moderate regurgitation. - Pulmonary arteries: PA peak pressure: 53 mm Hg (S).    Culture NA  Antibiotics: NA  DVT prophylaxis: Lovenox   Devices NA   LINES / TUBES:  NA    Continuous Infusions: . sodium chloride 40 mL/hr at 04/14/15 0511    Objective: VITAL SIGNS: Temp: 98.4 F (36.9 C) (01/15 0800) Temp Source: Oral (01/15 0800) BP: 100/65 mmHg (01/15 0800) Pulse Rate: 71 (01/15 0800) SPO2; FIO2:   Intake/Output Summary (Last 24 hours) at 04/14/15 0935 Last data filed at 04/14/15 0600  Gross per 24 hour  Intake    960 ml  Output    875 ml  Net     85 ml     Exam: General: A/O 4 , but confused, No acute respiratory distress Eyes: Negative headache, negative scleral hemorrhage ENT: Negative Runny negative gingival bleeding, Neck:  Negative scars, masses, torticollis, lymphadenopathy, JVD Lungs: positive right lung rhonchi (improved), Clear to auscultation left lung fields, without wheezes or crackles Cardiovascular: Regular rate and rhythm without murmur gallop or rub normal S1 and S2 Abdomen:negative abdominal pain, nondistended, positive soft, bowel sounds, no rebound, no ascites, no appreciable mass Extremities: No significant cyanosis, clubbing, or edema bilateral lower extremities Psychiatric:  Negative depression, negative anxiety, negative fatigue, negative mania  Neurologic:  Cranial nerves II through XII intact, tongue/uvula midline, all extremities muscle strength 5/5, sensation intact throughout, negative dysarthria, negative expressive aphasia, negative receptive  aphasia.   Data Reviewed: Basic Metabolic Panel:  Recent Labs Lab 04/10/15 1833 04/11/15 0312 04/12/15 0233 04/13/15 0332 04/14/15 0453  NA 141 142 143 143 141  K 4.3 4.4 4.3 4.2 4.2  CL 103 112* 113* 113* 112*  CO2 24 24 25 26 25   GLUCOSE 39* 125* 94 82 90  BUN 60* 52* 39* 28* 22*  CREATININE 1.75* 1.56* 1.18 1.02 0.91  CALCIUM 9.0 8.0* 8.2* 8.2* 8.4*  MG  --   --  2.2 2.1 2.0   Liver Function Tests:  Recent Labs Lab 04/10/15 1833 04/11/15 0312 04/12/15 0233 04/13/15 0332 04/14/15 0453  AST 38 38 27 23 27   ALT 22 21 17 17 18   ALKPHOS 38 31* 27* 29* 34*  BILITOT 0.5 0.7 0.5 0.5 0.5  PROT 6.3* 5.3* 4.6* 4.8* 5.2*  ALBUMIN 3.1* 2.5* 2.2* 2.1* 2.3*   No results for input(s): LIPASE, AMYLASE in the last 168 hours.  Recent Labs Lab 04/10/15 1834  AMMONIA 20   CBC:  Recent Labs Lab 04/10/15 1833 04/11/15 0312 04/12/15 0233 04/13/15 0332 04/14/15 0453  WBC 5.6 4.1 6.6 4.3 4.7  NEUTROABS 3.7 2.3 4.3 2.3 2.6  HGB 12.1* 10.4* 9.3* 9.2* 10.1*  HCT 37.2* 33.0* 28.8* 28.3* 30.3*  MCV 96.6 96.8 97.6 97.9 97.7  PLT 142* 126* 115* 98* 114*   Cardiac Enzymes: No results for input(s): CKTOTAL, CKMB, CKMBINDEX, TROPONINI in the last 168 hours. BNP (last 3 results) No results for input(s): BNP in the last 8760 hours.  ProBNP (  last 3 results) No results for input(s): PROBNP in the last 8760 hours.  CBG:  Recent Labs Lab 04/13/15 1619 04/13/15 2039 04/14/15 0059 04/14/15 0410 04/14/15 0835  GLUCAP 85 92 91 90 82    Recent Results (from the past 240 hour(s))  Culture, blood (routine x 2)     Status: None (Preliminary result)   Collection Time: 04/11/15 12:45 AM  Result Value Ref Range Status   Specimen Description BLOOD LEFT ARM  Final   Special Requests AEROBIC BOTTLE ONLY  Final   Culture NO GROWTH 2 DAYS  Final   Report Status PENDING  Incomplete  Culture, blood (routine x 2)     Status: None (Preliminary result)   Collection Time: 04/11/15  12:50 AM  Result Value Ref Range Status   Specimen Description BLOOD LEFT ARM  Final   Special Requests AEROBIC BOTTLE ONLY  Final   Culture NO GROWTH 2 DAYS  Final   Report Status PENDING  Incomplete  MRSA PCR Screening     Status: None   Collection Time: 04/11/15  4:07 AM  Result Value Ref Range Status   MRSA by PCR NEGATIVE NEGATIVE Final    Comment:        The GeneXpert MRSA Assay (FDA approved for NASAL specimens only), is one component of a comprehensive MRSA colonization surveillance program. It is not intended to diagnose MRSA infection nor to guide or monitor treatment for MRSA infections.      Studies:  Recent x-ray studies have been reviewed in detail by the Attending Physician  Scheduled Meds:  Scheduled Meds: . aspirin EC  81 mg Oral Q breakfast  . divalproex  250 mg Oral 3 times per day  . enoxaparin (LOVENOX) injection  30 mg Subcutaneous Daily  . fenofibrate  160 mg Oral Daily    Time spent on care of this patient: 40 mins   Christopher Waller, Christopher Waller , MD  [facility] Office  [phone] Pager [phone]  On-Call/Text Page:      [email or web address]      password [email or web address]  If 7PM-7AM, please contact night-coverage [email or web address] Password [email or web address] 04/14/2015, 9:35 AM   LOS: 3 days   Care during the described time interval was provided by me .  I have reviewed this patient's available data, including medical history, events of note, physical examination, and all test results as part of my evaluation. I have personally reviewed and interpreted all radiology studies.   Carolyne Littles, MD [phone] Pager

## 2015-04-14 NOTE — Progress Notes (Signed)
CM received call from MD to investigate discharge determination. CM reads Psych eval to understand pt "does not have capacity and placement recommended."  CM referred to CSW, Minerva Areolaric who states he is trying to reach family for SNF possibilities.  No other CM needs communicated at this time.  MD notified.
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
## 2015-04-14 NOTE — Progress Notes (Signed)
Social worker spoke to pt's nephew Sunday 4PM. They would consider rehab.Nephew requested phone call and update from Dr. Joseph ArtWoods.

## 2015-04-14 NOTE — Clinical Social Work Placement (Signed)
   CLINICAL SOCIAL WORK PLACEMENT  NOTE  Date:  04/14/2015  Patient Details  Name: Christopher Waller MRN: [id number] Date of Birth: [birth year]  Clinical Social Work is seeking post-discharge placement for this patient at the Skilled  Nursing Facility level of care (*CSW will initial, date and re-position this form in  chart as items are completed):  Yes   Patient/family provided with [facility] Clinical Social Work Department's list of facilities offering this level of care within the geographic area requested by the patient (or if unable, by the patient's family).  Yes   Patient/family informed of their freedom to choose among providers that offer the needed level of care, that participate in Medicare, Medicaid or managed care program needed by the patient, have an available bed and are willing to accept the patient.  Yes   Patient/family informed of [facility]'s ownership interest in [facility] and [facility], as well as of the fact that they are under no obligation to receive care at these facilities.  PASRR submitted to EDS on 04/14/15     PASRR number received on       Existing PASRR number confirmed on 04/14/15     FL2 transmitted to all facilities in geographic area requested by pt/family on 04/14/15     FL2 transmitted to all facilities within larger geographic area on 04/14/15     Patient informed that his/her managed care company has contracts with or will negotiate with certain facilities, including the following:            Patient/family informed of bed offers received.  Patient chooses bed at       Physician recommends and patient chooses bed at      Patient to be transferred to   on  .  Patient to be transferred to facility by       Patient family notified on   of transfer.  Name of family member notified:        PHYSICIAN Please sign FL2     Additional Comment:    _______________________________________________ Darleene CleaverAnterhaus, Tarah Buboltz R,  LCSWA 04/14/2015, 5:56 PM

## 2015-04-15 DIAGNOSIS — L8990 Pressure ulcer of unspecified site, unspecified stage: Secondary | ICD-10-CM | POA: Insufficient documentation

## 2015-04-15 DIAGNOSIS — Z95810 Presence of automatic (implantable) cardiac defibrillator: Secondary | ICD-10-CM

## 2015-04-15 DIAGNOSIS — E11649 Type 2 diabetes mellitus with hypoglycemia without coma: Secondary | ICD-10-CM | POA: Diagnosis not present

## 2015-04-15 DIAGNOSIS — R55 Syncope and collapse: Secondary | ICD-10-CM

## 2015-04-15 DIAGNOSIS — N179 Acute kidney failure, unspecified: Secondary | ICD-10-CM

## 2015-04-15 DIAGNOSIS — I350 Nonrheumatic aortic (valve) stenosis: Secondary | ICD-10-CM

## 2015-04-15 DIAGNOSIS — I9589 Other hypotension: Secondary | ICD-10-CM

## 2015-04-15 DIAGNOSIS — I255 Ischemic cardiomyopathy: Secondary | ICD-10-CM

## 2015-04-15 LAB — GLUCOSE, CAPILLARY
GLUCOSE-CAPILLARY: 115 mg/dL — AB (ref 65–99)
Glucose-Capillary: 66 mg/dL (ref 65–99)
Glucose-Capillary: 80 mg/dL (ref 65–99)
Glucose-Capillary: 91 mg/dL (ref 65–99)

## 2015-04-15 LAB — CBC WITH DIFFERENTIAL/PLATELET
Basophils Absolute: 0 10*3/uL (ref 0.0–0.1)
Basophils Relative: 0 %
EOS ABS: 0.3 10*3/uL (ref 0.0–0.7)
EOS PCT: 6 %
HCT: 29.4 % — ABNORMAL LOW (ref 39.0–52.0)
Hemoglobin: 9.7 g/dL — ABNORMAL LOW (ref 13.0–17.0)
LYMPHS ABS: 1.2 10*3/uL (ref 0.7–4.0)
Lymphocytes Relative: 26 %
MCH: 31.9 pg (ref 26.0–34.0)
MCHC: 33 g/dL (ref 30.0–36.0)
MCV: 96.7 fL (ref 78.0–100.0)
MONO ABS: 0.5 10*3/uL (ref 0.1–1.0)
MONOS PCT: 10 %
Neutro Abs: 2.5 10*3/uL (ref 1.7–7.7)
Neutrophils Relative %: 58 %
PLATELETS: 128 10*3/uL — AB (ref 150–400)
RBC: 3.04 MIL/uL — ABNORMAL LOW (ref 4.22–5.81)
RDW: 13.9 % (ref 11.5–15.5)
WBC: 4.4 10*3/uL (ref 4.0–10.5)

## 2015-04-15 LAB — COMPREHENSIVE METABOLIC PANEL
ALT: 17 U/L (ref 17–63)
AST: 30 U/L (ref 15–41)
Albumin: 2 g/dL — ABNORMAL LOW (ref 3.5–5.0)
Alkaline Phosphatase: 29 U/L — ABNORMAL LOW (ref 38–126)
Anion gap: 5 (ref 5–15)
BILIRUBIN TOTAL: 0.7 mg/dL (ref 0.3–1.2)
BUN: 19 mg/dL (ref 6–20)
CHLORIDE: 110 mmol/L (ref 101–111)
CO2: 25 mmol/L (ref 22–32)
CREATININE: 0.9 mg/dL (ref 0.61–1.24)
Calcium: 8.2 mg/dL — ABNORMAL LOW (ref 8.9–10.3)
Glucose, Bld: 82 mg/dL (ref 65–99)
Potassium: 4 mmol/L (ref 3.5–5.1)
Sodium: 140 mmol/L (ref 135–145)
TOTAL PROTEIN: 4.6 g/dL — AB (ref 6.5–8.1)

## 2015-04-15 LAB — PROCALCITONIN

## 2015-04-15 LAB — MAGNESIUM: MAGNESIUM: 1.9 mg/dL (ref 1.7–2.4)

## 2015-04-15 NOTE — Care Management Important Message (Signed)
Important Message  Patient Details  Name: Christopher Waller MRN: [id number] Date of Birth: 1924-08-31   Medicare Important Message Given:  Yes    Pedro Oldenburg P Livio Ledwith 04/15/2015, 4:18 PM

## 2015-04-15 NOTE — Clinical Social Work Note (Signed)
Patient has discharged home today and will be cared for by his nephew's wife (primary caregiver).  Patient was transported home by ambulance.   Genelle BalVanessa Nakiah Osgood, MSW, LCSW Licensed Clinical Social Worker Clinical Social Work Department [facility] [phone]

## 2015-04-15 NOTE — Progress Notes (Signed)
Physical Therapy Treatment Patient Details Name: Christopher Waller MRN: [id number] DOB: [birth year] Today's Date: 04/15/2015    History of Present Illness Christopher Waller is a 82 y.o. male with history of diabetes mellitus type 2, CHF, CAD status post CABG, chronic kidney disease was brought to the ER after patient was found to have bradycardia episode of confusion sinking spell.  Found to be hypotensive and hypoglycemic.    PT Comments    Pt is adamant about going home with his nephew. Displayed confusion during treatment session stating it was June. Required A x 2 to initiate walking as he was verbally worried he was going to fall. Pt tolerated ambulating 7650ft in hallway w/ RW. Verbally and visually fatigued after walking short distance. Pt would benefit from SNF once D/C'd to maximize recovery level and pt safety.  Follow Up Recommendations  SNF     Equipment Recommendations  None recommended by PT    Recommendations for Other Services       Precautions / Restrictions Precautions Precautions: Fall Restrictions Weight Bearing Restrictions: No    Mobility  Bed Mobility Overal bed mobility: Needs Assistance Bed Mobility: Supine to Sit     Supine to sit: Mod assist     General bed mobility comments: Reaching for therapist for assistance, assist for lifting trunk  Transfers Overall transfer level: Needs assistance Equipment used: Rolling walker (2 wheeled) Transfers: Sit to/from Stand Sit to Stand: Mod assist         General transfer comment: assist to wt shift anterior and push from bed, reassurance needed that he would not fall  Ambulation/Gait Ambulation/Gait assistance: Mod assist;+2 safety/equipment Ambulation Distance (Feet): 50 Feet Assistive device: Rolling walker (2 wheeled) Gait Pattern/deviations: Step-to pattern;Decreased stride length;Decreased step length - right;Decreased step length - left;Leaning posteriorly Gait velocity: slow Gait velocity  interpretation: <1.8 ft/sec, indicative of risk for recurrent falls General Gait Details: Pt requring A x 2 to initiate walking, progressed to A x1 with assistance using RW and VC's to stay close and move feet   Stairs            Wheelchair Mobility    Modified Rankin (Stroke Patients Only)       Balance         Postural control: Posterior lean Standing balance support: Bilateral upper extremity supported Standing balance-Leahy Scale: Poor                      Cognition Arousal/Alertness: Awake/alert Behavior During Therapy: Restless Overall Cognitive Status: Impaired/Different from baseline Area of Impairment: Orientation;Safety/judgement;Following commands;Awareness Orientation Level: Place;Time   Memory: Decreased recall of precautions;Decreased short-term memory   Safety/Judgement: Decreased awareness of deficits;Decreased awareness of safety          Exercises      General Comments General comments (skin integrity, edema, etc.): swelling in B hands, unoriented-believes it is June      Pertinent Vitals/Pain Pain Assessment: No/denies pain    Home Living                      Prior Function            PT Goals (current goals can now be found in the care plan section) Acute Rehab PT Goals Patient Stated Goal: go home today with nephew PT Goal Formulation: With patient Progress towards PT goals: Progressing toward goals    Frequency  Min 3X/week    PT Plan Discharge plan needs to be updated  Co-evaluation             End of Session Equipment Utilized During Treatment: Gait belt Activity Tolerance: Patient limited by fatigue Patient left: in chair;with call bell/phone within reach;with chair alarm set     Time: 1200-1217 PT Time Calculation (min) (ACUTE ONLY): 17 min  Charges:  $Gait Training: 8-22 mins                    G Codes:      Christopher Waller, 2:57 PM  Christopher Waller, Student Physical  Therapist Acute Rehab [phone]

## 2015-04-15 NOTE — Discharge Summary (Addendum)
Physician Discharge Summary  [facility] ONG:[id number] DOB: 11-01-1924 DOA: 04/10/2015  PCP: No PCP Per Patient  Admit date: 04/10/2015 Discharge date: 04/15/2015  Time spent: 50 minutes   Discharge Condition: stable  Follow up:  1. Check CBG three times a day prior to meals 2. Stopped antihypertensive- f/u BP BID    Discharge Diagnoses:  Principal Problem:   Syncope and collapse Active Problems:   AKI (acute kidney injury) (HCC)   Hypoglycemia   Hypotension   Aortic stenosis   Biventricular implantable cardioverter-defibrillator medtronic    Ischemic cardiomyopathy   Dementia   Arterial hypotension   Cardiomyopathy, ischemic   Pulmonary hypertension (HCC)   Pressure ulcer  Admit HPI / Brief Narrative: 82 y.o. male PMHx DM Type 2,  Chronic Systolic CHF, Dilated Cardiomyopathy, Pulmonary HTN, , CAD S/P  CABG, S/P Biventricular implantable cardioverter-defibrillator medtronic,CKD   Brought to the ER after patient was found to have bradycardia episode of confusion - blood sugar was found to be in 30s and was  given D50 in the ER. He was further found to be hypotensive which improved with 2 L normal saline bolus- admitted for further observation.  NOTE: - Patient lives with his nephew and at this time unable to reach his nephew. Note per nursing upon admission patient was wearing 3 soiled diapers and was very unkept : Adult Protective Services contacted   HPI/Subjective: No complaints today.    Assessment/Plan: Syncope and collapse -Most likely secondary to hypoglycemia, hypotension and dehydration  Mod Ao Stenosis - ECHO also shows mod Ao Stenosis and mod dilated RV with reduced function and mod TR  Hypotension/ AKI  - probably from dehydration and poor oral intake. Holding off antihypertensive and gently hydrating  - has a prior history of systolic heart failure but repeat ECHO show this has resolved - have advised to follow BP closely to decide if  medications need to be re-initiate   Hypoglycemia in a patient with diabetes mellitus type 2  -CBGs in 30s in ER-  holding patient's glipizide and Januvia  - sugars have been normal and therefore, at this time he does not need resumption of these medications   CAD native artery  -S/P CABG - denies any chest pain. -S/P AICD placement   Systolic CHF/ischemic cardiomyopathy - last measured EF 35-40%- now found to be improved to 50-55%  Pulmonary Hypertension severe -See systolic CHF  Cough with rhonchi -PCXR neg for pneumonia  Dementia  -Continue Depakote 250 mg TID  NOTE: PT/OT recommends home health/SNF however, family prefers to take him home  Procedure/Significant Events: 01/04/2015 echocardiogram- Left ventricle:  mild LVH. -LVEF= 35% to 40%. - Aortic valve: moderate. - Left atrium: mildly to moderately dilated.- Right ventricle: mildly dilated.  - Right atrium: mildly to moderately dilated. - Pulmonary arteries: PA peak pressure: 52 mm Hg (S). 1/12 PCXR; No acute cardiopulmonary process 1/13 echocardiogram;- LVEF=50% to 55%. - Ventricular septum: The contour showed diastolic flattening and systolic flattening. - Aortic valve: Moderate aortic stenosis.- Left atrium: Moderately dilated - Right ventricle: moderately dilated.  - Tricuspid valve: moderate regurgitation. - Pulmonary arteries: PA peak pressure: 53 mm Hg (S).   Discharge Exam: Filed Weights   04/11/15 0247 04/13/15 0449 04/14/15 0435  Weight: 64.1 kg (141 lb 5 oz) 67.9 kg (149 lb 11.1 oz) 67 kg (147 lb 11.3 oz)   Filed Vitals:   04/15/15 0504 04/15/15 0936  BP: 121/48 117/59  Pulse: 73 76  Temp: 97.8 F (36.6  C) 98 F (36.7 C)  Resp: 20 20    General: AAO x 3, no distress Cardiovascular: RRR, no murmurs  Respiratory: clear to auscultation bilaterally GI: soft, non-tender, non-distended, bowel sound positive  Discharge Instructions You were cared for by a hospitalist during your hospital stay. If  you have any questions about your discharge medications or the care you received while you were in the hospital after you are discharged, you can call the unit and asked to speak with the hospitalist on call if the hospitalist that took care of you is not available. Once you are discharged, your primary care physician will handle any further medical issues. Please note that NO REFILLS for any discharge medications will be authorized once you are discharged, as it is imperative that you return to your primary care physician (or establish a relationship with a primary care physician if you do not have one) for your aftercare needs so that they can reassess your need for medications and monitor your lab values.      Discharge Instructions    Diet - low sodium heart healthy    Complete by:  As directed      Increase activity slowly    Complete by:  As directed             Medication List    STOP taking these medications        BONINE PO     furosemide 40 MG tablet  Commonly known as:  LASIX     glipiZIDE 10 MG 24 hr tablet  Commonly known as:  GLUCOTROL XL     guaiFENesin 100 MG/5ML liquid  Commonly known as:  ROBITUSSIN     lisinopril 10 MG tablet  Commonly known as:  PRINIVIL,ZESTRIL     metoprolol succinate 50 MG 24 hr tablet  Commonly known as:  TOPROL-XL     sitaGLIPtin 100 MG tablet  Commonly known as:  JANUVIA      TAKE these medications        aspirin 81 MG tablet  Take 81 mg by mouth daily with breakfast.     divalproex 125 MG capsule  Commonly known as:  DEPAKOTE SPRINKLE  Take 2 capsules (250 mg total) by mouth every 8 (eight) hours.     fenofibrate 145 MG tablet  Commonly known as:  TRICOR  Take 145 mg by mouth at bedtime.       No Known Allergies    The results of significant diagnostics from this hospitalization (including imaging, microbiology, ancillary and laboratory) are listed below for reference.    Significant Diagnostic Studies: Dg Chest  Port 1 View  04/13/2015  CLINICAL DATA:  Pneumonia.  Short of breath. EXAM: PORTABLE CHEST 1 VIEW COMPARISON:  04/11/2015 FINDINGS: Heart remains mildly enlarged. Left subclavian AICD device is stable. Sternal wires are stable. Lungs under aerated and grossly clear. No pneumothorax. IMPRESSION: No active disease. Electronically Signed   By: Jolaine ClickArthur  Hoss M.D.   On: 04/13/2015 10:21   Dg Chest Port 1 View  04/11/2015  CLINICAL DATA:  82 year old male with hypotension EXAM: PORTABLE CHEST 1 VIEW COMPARISON:  None. FINDINGS: Single-view of the chest does not demonstrate a focal consolidation. There is no pleural effusion or pneumothorax. Mild cardiomegaly. Left pectoral AICD device. Median sternotomy wires and CABG vascular clips. The osseous structures are grossly unremarkable with IMPRESSION: No acute cardiopulmonary process. Electronically Signed   By: Elgie CollardArash  Radparvar M.D.   On: 04/11/2015 01:36  Microbiology: Recent Results (from the past 240 hour(s))  Culture, blood (routine x 2)     Status: None (Preliminary result)   Collection Time: 04/11/15 12:45 AM  Result Value Ref Range Status   Specimen Description BLOOD LEFT ARM  Final   Special Requests AEROBIC BOTTLE ONLY  Final   Culture NO GROWTH 4 DAYS  Final   Report Status PENDING  Incomplete  Culture, blood (routine x 2)     Status: None (Preliminary result)   Collection Time: 04/11/15 12:50 AM  Result Value Ref Range Status   Specimen Description BLOOD LEFT ARM  Final   Special Requests AEROBIC BOTTLE ONLY  Final   Culture NO GROWTH 4 DAYS  Final   Report Status PENDING  Incomplete  MRSA PCR Screening     Status: None   Collection Time: 04/11/15  4:07 AM  Result Value Ref Range Status   MRSA by PCR NEGATIVE NEGATIVE Final    Comment:        The GeneXpert MRSA Assay (FDA approved for NASAL specimens only), is one component of a comprehensive MRSA colonization surveillance program. It is not intended to diagnose  MRSA infection nor to guide or monitor treatment for MRSA infections.      Labs: Basic Metabolic Panel:  Recent Labs Lab 04/11/15 0312 04/12/15 0233 04/13/15 0332 04/14/15 0453 04/15/15 0604  NA 142 143 143 141 140  K 4.4 4.3 4.2 4.2 4.0  CL 112* 113* 113* 112* 110  CO2 24 25 26 25 25   GLUCOSE 125* 94 82 90 82  BUN 52* 39* 28* 22* 19  CREATININE 1.56* 1.18 1.02 0.91 0.90  CALCIUM 8.0* 8.2* 8.2* 8.4* 8.2*  MG  --  2.2 2.1 2.0 1.9   Liver Function Tests:  Recent Labs Lab 04/11/15 0312 04/12/15 0233 04/13/15 0332 04/14/15 0453 04/15/15 0604  AST 38 27 23 27 30   ALT 21 17 17 18 17   ALKPHOS 31* 27* 29* 34* 29*  BILITOT 0.7 0.5 0.5 0.5 0.7  PROT 5.3* 4.6* 4.8* 5.2* 4.6*  ALBUMIN 2.5* 2.2* 2.1* 2.3* 2.0*   No results for input(s): LIPASE, AMYLASE in the last 168 hours.  Recent Labs Lab 04/10/15 1834  AMMONIA 20   CBC:  Recent Labs Lab 04/11/15 0312 04/12/15 0233 04/13/15 0332 04/14/15 0453 04/15/15 0604  WBC 4.1 6.6 4.3 4.7 4.4  NEUTROABS 2.3 4.3 2.3 2.6 2.5  HGB 10.4* 9.3* 9.2* 10.1* 9.7*  HCT 33.0* 28.8* 28.3* 30.3* 29.4*  MCV 96.8 97.6 97.9 97.7 96.7  PLT 126* 115* 98* 114* 128*   Cardiac Enzymes: No results for input(s): CKTOTAL, CKMB, CKMBINDEX, TROPONINI in the last 168 hours. BNP: BNP (last 3 results) No results for input(s): BNP in the last 8760 hours.  ProBNP (last 3 results) No results for input(s): PROBNP in the last 8760 hours.  CBG:  Recent Labs Lab 04/14/15 2048 04/15/15 0044 04/15/15 0438 04/15/15 0727 04/15/15 1205  GLUCAP 109* 91 80 66 115*       SignedCalvert Cantor, MD [facility] 04/15/2015, 1:08 PM

## 2015-04-16 LAB — CULTURE, BLOOD (ROUTINE X 2)
Culture: NO GROWTH
Culture: NO GROWTH

## 2015-04-20 ENCOUNTER — Emergency Department (HOSPITAL_COMMUNITY): Payer: Medicare Other

## 2015-04-20 ENCOUNTER — Encounter (HOSPITAL_COMMUNITY): Payer: Self-pay | Admitting: Emergency Medicine

## 2015-04-20 ENCOUNTER — Inpatient Hospital Stay (HOSPITAL_COMMUNITY)
Admission: EM | Admit: 2015-04-20 | Discharge: 2015-04-25 | DRG: 291 | Disposition: A | Discharge status: Home / Self Care | Payer: Medicare Other | Attending: Internal Medicine | Admitting: Internal Medicine

## 2015-04-20 DIAGNOSIS — I350 Nonrheumatic aortic (valve) stenosis: Secondary | ICD-10-CM | POA: Diagnosis not present

## 2015-04-20 DIAGNOSIS — E1122 Type 2 diabetes mellitus with diabetic chronic kidney disease: Secondary | ICD-10-CM | POA: Diagnosis present

## 2015-04-20 DIAGNOSIS — G253 Myoclonus: Secondary | ICD-10-CM | POA: Diagnosis present

## 2015-04-20 DIAGNOSIS — Z87891 Personal history of nicotine dependence: Secondary | ICD-10-CM

## 2015-04-20 DIAGNOSIS — Z951 Presence of aortocoronary bypass graft: Secondary | ICD-10-CM

## 2015-04-20 DIAGNOSIS — E785 Hyperlipidemia, unspecified: Secondary | ICD-10-CM | POA: Diagnosis present

## 2015-04-20 DIAGNOSIS — N179 Acute kidney failure, unspecified: Secondary | ICD-10-CM | POA: Diagnosis not present

## 2015-04-20 DIAGNOSIS — Z7982 Long term (current) use of aspirin: Secondary | ICD-10-CM

## 2015-04-20 DIAGNOSIS — R0602 Shortness of breath: Secondary | ICD-10-CM | POA: Diagnosis not present

## 2015-04-20 DIAGNOSIS — E1165 Type 2 diabetes mellitus with hyperglycemia: Secondary | ICD-10-CM | POA: Diagnosis present

## 2015-04-20 DIAGNOSIS — E876 Hypokalemia: Secondary | ICD-10-CM | POA: Diagnosis not present

## 2015-04-20 DIAGNOSIS — Z95810 Presence of automatic (implantable) cardiac defibrillator: Secondary | ICD-10-CM | POA: Diagnosis not present

## 2015-04-20 DIAGNOSIS — I130 Hypertensive heart and chronic kidney disease with heart failure and stage 1 through stage 4 chronic kidney disease, or unspecified chronic kidney disease: Secondary | ICD-10-CM | POA: Diagnosis not present

## 2015-04-20 DIAGNOSIS — Z8249 Family history of ischemic heart disease and other diseases of the circulatory system: Secondary | ICD-10-CM

## 2015-04-20 DIAGNOSIS — E119 Type 2 diabetes mellitus without complications: Secondary | ICD-10-CM

## 2015-04-20 DIAGNOSIS — Z515 Encounter for palliative care: Secondary | ICD-10-CM | POA: Diagnosis not present

## 2015-04-20 DIAGNOSIS — D649 Anemia, unspecified: Secondary | ICD-10-CM | POA: Diagnosis present

## 2015-04-20 DIAGNOSIS — I5043 Acute on chronic combined systolic (congestive) and diastolic (congestive) heart failure: Secondary | ICD-10-CM | POA: Diagnosis present

## 2015-04-20 DIAGNOSIS — I5031 Acute diastolic (congestive) heart failure: Secondary | ICD-10-CM | POA: Diagnosis present

## 2015-04-20 DIAGNOSIS — F0390 Unspecified dementia without behavioral disturbance: Secondary | ICD-10-CM | POA: Diagnosis present

## 2015-04-20 DIAGNOSIS — I50810 Right heart failure, unspecified: Secondary | ICD-10-CM

## 2015-04-20 DIAGNOSIS — I272 Other secondary pulmonary hypertension: Secondary | ICD-10-CM | POA: Diagnosis present

## 2015-04-20 DIAGNOSIS — J90 Pleural effusion, not elsewhere classified: Secondary | ICD-10-CM | POA: Diagnosis present

## 2015-04-20 DIAGNOSIS — I248 Other forms of acute ischemic heart disease: Secondary | ICD-10-CM | POA: Diagnosis present

## 2015-04-20 DIAGNOSIS — I509 Heart failure, unspecified: Secondary | ICD-10-CM

## 2015-04-20 DIAGNOSIS — I082 Rheumatic disorders of both aortic and tricuspid valves: Secondary | ICD-10-CM | POA: Diagnosis present

## 2015-04-20 DIAGNOSIS — I255 Ischemic cardiomyopathy: Secondary | ICD-10-CM | POA: Diagnosis present

## 2015-04-20 DIAGNOSIS — I2510 Atherosclerotic heart disease of native coronary artery without angina pectoris: Secondary | ICD-10-CM | POA: Insufficient documentation

## 2015-04-20 DIAGNOSIS — R1310 Dysphagia, unspecified: Secondary | ICD-10-CM | POA: Insufficient documentation

## 2015-04-20 DIAGNOSIS — Z8679 Personal history of other diseases of the circulatory system: Secondary | ICD-10-CM

## 2015-04-20 DIAGNOSIS — R1313 Dysphagia, pharyngeal phase: Secondary | ICD-10-CM | POA: Diagnosis present

## 2015-04-20 DIAGNOSIS — I2583 Coronary atherosclerosis due to lipid rich plaque: Secondary | ICD-10-CM | POA: Diagnosis present

## 2015-04-20 DIAGNOSIS — N183 Chronic kidney disease, stage 3 (moderate): Secondary | ICD-10-CM | POA: Diagnosis present

## 2015-04-20 DIAGNOSIS — I420 Dilated cardiomyopathy: Secondary | ICD-10-CM | POA: Diagnosis present

## 2015-04-20 LAB — CBC WITH DIFFERENTIAL/PLATELET
BASOS PCT: 0 %
Basophils Absolute: 0 10*3/uL (ref 0.0–0.1)
EOS ABS: 0.1 10*3/uL (ref 0.0–0.7)
EOS PCT: 2 %
HEMATOCRIT: 35.5 % — AB (ref 39.0–52.0)
Hemoglobin: 11.6 g/dL — ABNORMAL LOW (ref 13.0–17.0)
Lymphocytes Relative: 16 %
Lymphs Abs: 1.1 10*3/uL (ref 0.7–4.0)
MCH: 31.9 pg (ref 26.0–34.0)
MCHC: 32.7 g/dL (ref 30.0–36.0)
MCV: 97.5 fL (ref 78.0–100.0)
Monocytes Absolute: 0.7 10*3/uL (ref 0.1–1.0)
Monocytes Relative: 10 %
NEUTROS PCT: 72 %
Neutro Abs: 5 10*3/uL (ref 1.7–7.7)
Platelets: 164 10*3/uL (ref 150–400)
RBC: 3.64 MIL/uL — ABNORMAL LOW (ref 4.22–5.81)
RDW: 14.2 % (ref 11.5–15.5)
WBC: 6.9 10*3/uL (ref 4.0–10.5)

## 2015-04-20 LAB — I-STAT CHEM 8, ED
BUN: 13 mg/dL (ref 6–20)
Calcium, Ion: 1.22 mmol/L (ref 1.13–1.30)
Chloride: 104 mmol/L (ref 101–111)
Creatinine, Ser: 0.9 mg/dL (ref 0.61–1.24)
Glucose, Bld: 173 mg/dL — ABNORMAL HIGH (ref 65–99)
HEMATOCRIT: 39 % (ref 39.0–52.0)
HEMOGLOBIN: 13.3 g/dL (ref 13.0–17.0)
Potassium: 4.3 mmol/L (ref 3.5–5.1)
SODIUM: 145 mmol/L (ref 135–145)
TCO2: 27 mmol/L (ref 0–100)

## 2015-04-20 LAB — CBG MONITORING, ED
GLUCOSE-CAPILLARY: 172 mg/dL — AB (ref 65–99)
Glucose-Capillary: 156 mg/dL — ABNORMAL HIGH (ref 65–99)

## 2015-04-20 LAB — TROPONIN I: Troponin I: 0.26 ng/mL — ABNORMAL HIGH (ref <0.031)

## 2015-04-20 LAB — BRAIN NATRIURETIC PEPTIDE: B NATRIURETIC PEPTIDE 5: 303 pg/mL — AB (ref 0.0–100.0)

## 2015-04-20 LAB — PROCALCITONIN

## 2015-04-20 LAB — VALPROIC ACID LEVEL: VALPROIC ACID LVL: 23 ug/mL — AB (ref 50.0–100.0)

## 2015-04-20 MED ORDER — FUROSEMIDE 10 MG/ML IJ SOLN
40.0000 mg | Freq: Once | INTRAMUSCULAR | Status: AC
  Administered 2015-04-20: 40 mg via INTRAVENOUS
  Filled 2015-04-20: qty 4

## 2015-04-20 MED ORDER — SODIUM CHLORIDE 0.9 % IJ SOLN
3.0000 mL | Freq: Two times a day (BID) | INTRAMUSCULAR | Status: DC
  Administered 2015-04-20 – 2015-04-25 (×8): 3 mL via INTRAVENOUS

## 2015-04-20 MED ORDER — ASPIRIN 81 MG PO CHEW
81.0000 mg | CHEWABLE_TABLET | Freq: Every day | ORAL | Status: DC
  Administered 2015-04-21 – 2015-04-25 (×5): 81 mg via ORAL
  Filled 2015-04-20 (×5): qty 1

## 2015-04-20 MED ORDER — SODIUM CHLORIDE 0.9 % IJ SOLN: 3.0000 mL | INTRAMUSCULAR | Status: DC | PRN

## 2015-04-20 MED ORDER — GUAIFENESIN ER 600 MG PO TB12
600.0000 mg | ORAL_TABLET | Freq: Two times a day (BID) | ORAL | Status: DC
  Administered 2015-04-20 – 2015-04-25 (×8): 600 mg via ORAL
  Filled 2015-04-20 (×9): qty 1

## 2015-04-20 MED ORDER — DIVALPROEX SODIUM 125 MG PO CSDR
250.0000 mg | DELAYED_RELEASE_CAPSULE | Freq: Three times a day (TID) | ORAL | Status: DC
  Administered 2015-04-20 – 2015-04-25 (×15): 250 mg via ORAL
  Filled 2015-04-20 (×15): qty 2

## 2015-04-20 MED ORDER — ONDANSETRON HCL 4 MG/2ML IJ SOLN
4.0000 mg | Freq: Four times a day (QID) | INTRAMUSCULAR | Status: DC | PRN
  Filled 2015-04-20: qty 2

## 2015-04-20 MED ORDER — METOPROLOL TARTRATE 12.5 MG HALF TABLET
12.5000 mg | ORAL_TABLET | Freq: Two times a day (BID) | ORAL | Status: DC
  Administered 2015-04-20 – 2015-04-25 (×10): 12.5 mg via ORAL
  Filled 2015-04-20 (×10): qty 1

## 2015-04-20 MED ORDER — INSULIN ASPART 100 UNIT/ML ~~LOC~~ SOLN
0.0000 [IU] | SUBCUTANEOUS | Status: DC
  Administered 2015-04-20 (×2): 2 [IU] via SUBCUTANEOUS
  Administered 2015-04-21 – 2015-04-23 (×6): 1 [IU] via SUBCUTANEOUS
  Administered 2015-04-24: 3 [IU] via SUBCUTANEOUS
  Administered 2015-04-24: 2 [IU] via SUBCUTANEOUS
  Administered 2015-04-25 (×2): 3 [IU] via SUBCUTANEOUS
  Administered 2015-04-25: 2 [IU] via SUBCUTANEOUS
  Filled 2015-04-20: qty 1

## 2015-04-20 MED ORDER — SODIUM CHLORIDE 0.9 % IV SOLN: 250.0000 mL | INTRAVENOUS | Status: DC | PRN

## 2015-04-20 MED ORDER — ACETAMINOPHEN 325 MG PO TABS
650.0000 mg | ORAL_TABLET | ORAL | Status: DC | PRN
  Administered 2015-04-23 – 2015-04-24 (×2): 650 mg via ORAL
  Filled 2015-04-20 (×2): qty 2

## 2015-04-20 MED ORDER — NITROGLYCERIN 2 % TD OINT
1.0000 [in_us] | TOPICAL_OINTMENT | Freq: Once | TRANSDERMAL | Status: AC
  Administered 2015-04-20: 1 [in_us] via TOPICAL
  Filled 2015-04-20: qty 1

## 2015-04-20 MED ORDER — ENOXAPARIN SODIUM 40 MG/0.4ML ~~LOC~~ SOLN
40.0000 mg | SUBCUTANEOUS | Status: DC
  Administered 2015-04-20 – 2015-04-23 (×4): 40 mg via SUBCUTANEOUS
  Filled 2015-04-20 (×4): qty 0.4

## 2015-04-20 MED ORDER — LISINOPRIL 10 MG PO TABS
10.0000 mg | ORAL_TABLET | Freq: Every day | ORAL | Status: DC
  Administered 2015-04-20 – 2015-04-21 (×2): 10 mg via ORAL
  Filled 2015-04-20 (×2): qty 1

## 2015-04-20 MED ORDER — ASPIRIN 81 MG PO TABS: 81.0000 mg | ORAL_TABLET | Freq: Every day | ORAL | Status: DC

## 2015-04-20 MED ORDER — IOHEXOL 300 MG/ML  SOLN
75.0000 mL | Freq: Once | INTRAMUSCULAR | Status: AC | PRN
  Administered 2015-04-20: 75 mL via INTRAVENOUS

## 2015-04-20 MED ORDER — FENOFIBRATE 54 MG PO TABS
54.0000 mg | ORAL_TABLET | Freq: Every day | ORAL | Status: DC
  Administered 2015-04-21 – 2015-04-25 (×4): 54 mg via ORAL
  Filled 2015-04-20 (×5): qty 1

## 2015-04-20 NOTE — Progress Notes (Addendum)
1900 admitted from ER placed in  Safety preacaution observed . Awake alert and oriented

## 2015-04-20 NOTE — ED Notes (Signed)
Pt to ER via [facility] from home with complaint of shortness of breath and congestion, discharged 5 days ago for same. Pt wheezing on arrival. Per EMS VS- 155/89, SpO2 95% RA, 95 HR. Pt is a/o x4.

## 2015-04-20 NOTE — ED Provider Notes (Signed)
CSN: [id number]     Arrival date & time 04/20/15  1113 History   First MD Initiated Contact with Patient 04/20/15 1157     Chief Complaint  Patient presents with  . Shortness of Breath     (Consider location/radiation/quality/duration/timing/severity/associated sxs/prior Treatment) HPI Level V caveat dementia history is obtained from patient's nephew and caregiver [name] Arture via telephone. [phone]. Patient discharged from hospital 04/15/2015 . He was admitted with tremors. Noted to have hypoglycemia, ischemic cardiomyopathy. Since discharge Mr.Arture reports the patient has had gurgling respirations. No cough. No fever. Patient reports to me that he feels "a gurgling in his throat. He's been treated with Mucinex, without relief. He did receive IV hydration while here. No other associated symptoms. No known fever. Past Medical History  Diagnosis Date  . Coronary artery disease   . Dementia   . Diabetes mellitus without complication (HCC)   . Hyperlipidemia   . Ischemic cardiomyopathy 01/05/2015  . Biventricular implantable cardioverter-defibrillator medtronic  01/05/2015   Past Surgical History  Procedure Laterality Date  . Pacemaker insertion    . Coronary artery bypass graft     Family History  Problem Relation Age of Onset  . Hypertension Other    Social History  Substance Use Topics  . Smoking status: Former Smoker    Quit date: 03/30/1964  . Smokeless tobacco: None  . Alcohol Use: No    Review of Systems  Unable to perform ROS: Dementia      Allergies  Review of patient's allergies indicates no known allergies.  Home Medications   Prior to Admission medications   Medication Sig Start Date End Date Taking? Authorizing Provider  aspirin 81 MG tablet Take 81 mg by mouth daily with breakfast.     Historical Provider, MD  divalproex (DEPAKOTE SPRINKLE) 125 MG capsule Take 2 capsules (250 mg total) by mouth every 8 (eight) hours. 01/07/15   Shanker Levora Dredge, MD  fenofibrate (TRICOR) 145 MG tablet Take 145 mg by mouth at bedtime.     Historical Provider, MD   BP 147/59 mmHg  Pulse 88  Temp(Src) 98.1 F (36.7 C) (Oral)  Resp 25  SpO2 96% Physical Exam  Constitutional: No distress.  Chronically ill-appearing  HENT:  Head: Normocephalic and atraumatic.  Eyes: Conjunctivae are normal. Pupils are equal, round, and reactive to light.  Neck: Neck supple. No tracheal deviation present. No thyromegaly present.  Cardiovascular: Normal rate and regular rhythm.   No murmur heard. Pulmonary/Chest: Effort normal.  Coarse rhonchorous respirations.  Abdominal: Soft. Bowel sounds are normal. He exhibits no distension. There is no tenderness.  Musculoskeletal: Normal range of motion. He exhibits no edema or tenderness.  Neurological: He is alert. Coordination normal.  Skin: Skin is warm and dry. No rash noted.  Psychiatric: He has a normal mood and affect.  Nursing note and vitals reviewed.   ED Course  Procedures (including critical care time) Labs Review Labs Reviewed  CBC WITH DIFFERENTIAL/PLATELET  BRAIN NATRIURETIC PEPTIDE  VALPROIC ACID LEVEL  I-STAT CHEM 8, ED  CBG MONITORING, ED    Imaging Review No results found. I have personally reviewed and evaluated these images and lab results as part of my medical decision-making.   EKG Interpretation None      ED ECG REPORT   Date: 04/20/2015  Rate: 90  Rhythm: Electronically paced  QRS Axis: right  Intervals: normal  ST/T Wave abnormalities: nonspecific T wave changes  Conduction Disutrbances:nonspecific intraventricular conduction delay  Narrative  Interpretation:   Old EKG Reviewed: No significant change over 04/10/2015  I have personally reviewed the EKG tracing and agree with the computerized printout as noted. Results for orders placed or performed during the hospital encounter of 04/20/15  CBC with Differential/Platelet  Result Value Ref Range   WBC 6.9 4.0 - 10.5  K/uL   RBC 3.64 (L) 4.22 - 5.81 MIL/uL   Hemoglobin 11.6 (L) 13.0 - 17.0 g/dL   HCT 16.1 (L) 09.6 - 04.5 %   MCV 97.5 78.0 - 100.0 fL   MCH 31.9 26.0 - 34.0 pg   MCHC 32.7 30.0 - 36.0 g/dL   RDW 40.9 81.1 - 91.4 %   Platelets 164 150 - 400 K/uL   Neutrophils Relative % 72 %   Neutro Abs 5.0 1.7 - 7.7 K/uL   Lymphocytes Relative 16 %   Lymphs Abs 1.1 0.7 - 4.0 K/uL   Monocytes Relative 10 %   Monocytes Absolute 0.7 0.1 - 1.0 K/uL   Eosinophils Relative 2 %   Eosinophils Absolute 0.1 0.0 - 0.7 K/uL   Basophils Relative 0 %   Basophils Absolute 0.0 0.0 - 0.1 K/uL  Brain natriuretic peptide  Result Value Ref Range   B Natriuretic Peptide 303.0 (H) 0.0 - 100.0 pg/mL  Valproic acid level  Result Value Ref Range   Valproic Acid Lvl 23 (L) 50.0 - 100.0 ug/mL  I-stat chem 8, ed  Result Value Ref Range   Sodium 145 135 - 145 mmol/L   Potassium 4.3 3.5 - 5.1 mmol/L   Chloride 104 101 - 111 mmol/L   BUN 13 6 - 20 mg/dL   Creatinine, Ser 7.82 0.61 - 1.24 mg/dL   Glucose, Bld 956 (H) 65 - 99 mg/dL   Calcium, Ion 2.13 0.86 - 1.30 mmol/L   TCO2 27 0 - 100 mmol/L   Hemoglobin 13.3 13.0 - 17.0 g/dL   HCT 57.8 46.9 - 62.9 %  POC CBG, ED  Result Value Ref Range   Glucose-Capillary 172 (H) 65 - 99 mg/dL   Dg Chest 2 View  08/25/4130  CLINICAL DATA:  82 year old male with wheezing, shortness of breath chest EXAM: CHEST  2 VIEW COMPARISON:  Prior chest x-ray 04/13/2015 FINDINGS: Stable position of left subclavian approach biventricular cardiac rhythm maintenance device. The leads project over the right atrium, right ventricle and overlying the left ventricle. Cardiac and mediastinal contours are unchanged. Atherosclerotic calcifications present in the transverse aorta. Patient is status post median sternotomy. Slightly increased pulmonary vascular congestion without overt edema. No pneumothorax. Small bilateral pleural effusions. Compression deformity of L1. No acute osseous abnormality.  IMPRESSION: 1. Slightly increased pulmonary vascular congestion bordering on mild edema. 2. Small bilateral pleural effusions. 3. Stable cardiomegaly. 4. Age indeterminate L1 compression fracture.  Favor remote. Electronically Signed   By: Malachy Moan M.D.   On: 04/20/2015 13:48   Ct Soft Tissue Neck W Contrast  04/20/2015  CLINICAL DATA:  82 year old diabetic male with dementia coughing or choking to the large secretions. Initial encounter. EXAM: CT NECK WITH CONTRAST TECHNIQUE: Multidetector CT imaging of the neck was performed using the standard protocol following the bolus administration of intravenous contrast. CONTRAST:  75mL OMNIPAQUE IOHEXOL 300 MG/ML  SOLN COMPARISON:  None. FINDINGS: Pharynx and larynx: Negative. Salivary glands: Negative. Thyroid: Negative. Lymph nodes: No adenopathy. Vascular: Significant atherosclerotic changes including bilateral carotid bifurcation calcification with 62% diameter stenosis proximal right internal carotid artery and 64% diameter stenosis proximal left internal carotid artery. Moderate  to marked narrowing right vertebral artery and mild to moderate narrowing left vertebral artery at the level of foramen magnum. Limited intracranial: Atrophy and small vessel disease changes. Visualized orbits: Limited evaluation and unremarkable. Mastoids and visualized paranasal sinuses: Minimal polypoid opacification inferior right maxillary sinus. Skeleton: Degenerative changes cervical spine most notable C3-4 and C5-6 with fusion C4-5. Remote anterior wedge compression deformity T2 with 50% loss height anteriorly. Upper chest: Small to slightly moderate-size left-sided and small right-sided pleural effusion with adjacent mild atelectasis. Pacemaker in place. IMPRESSION: Exam limited by motion and dental artifact without pharyngeal mass noted. Bilateral carotid bifurcation calcification with 62% diameter stenosis proximal right internal carotid artery and 64% diameter stenosis  proximal left internal carotid artery. Moderate to marked narrowing right vertebral artery and mild to moderate narrowing left vertebral artery at the level of foramen magnum. Degenerative changes cervical spine most notable C3-4 and C5-6 with fusion C4-5. Remote anterior wedge compression deformity T2 with 50% loss height anteriorly. Small to slightly moderate-size left-sided and small right-sided pleural effusion with adjacent mild atelectasis. Electronically Signed   By: Lacy Duverney M.D.   On: 04/20/2015 15:20   Dg Chest Port 1 View  04/13/2015  CLINICAL DATA:  Pneumonia.  Short of breath. EXAM: PORTABLE CHEST 1 VIEW COMPARISON:  04/11/2015 FINDINGS: Heart remains mildly enlarged. Left subclavian AICD device is stable. Sternal wires are stable. Lungs under aerated and grossly clear. No pneumothorax. IMPRESSION: No active disease. Electronically Signed   By: Jolaine Click M.D.   On: 04/13/2015 10:21   Dg Chest Port 1 View  04/11/2015  CLINICAL DATA:  82 year old male with hypotension EXAM: PORTABLE CHEST 1 VIEW COMPARISON:  None. FINDINGS: Single-view of the chest does not demonstrate a focal consolidation. There is no pleural effusion or pneumothorax. Mild cardiomegaly. Left pectoral AICD device. Median sternotomy wires and CABG vascular clips. The osseous structures are grossly unremarkable with IMPRESSION: No acute cardiopulmonary process. Electronically Signed   By: Elgie Collard M.D.   On: 04/11/2015 01:36    MDM  Consulted with hospitalist service will arrange for 23 hour observation, telemetry. Intravenous Lasix and topical nitrates ordered by me. Patient felt to be fluid overloaded from previous hospitalization  Dx #1 chf #2 hyperglycemia #3 anemia Final diagnoses:  None        Doug Sou, MD 04/20/15 1601

## 2015-04-20 NOTE — ED Notes (Signed)
CBG was 172.  

## 2015-04-20 NOTE — H&P (Signed)
[facility] History and Physical                                                                                    Christopher Waller, is a 82 y.o. male  MRN: [id number]   DOB - 03-24-1925  Admit Date - 04/20/2015  Outpatient Primary MD for the patient is No PCP Per Patient  Referring MD: Ethelda Chick / ER  PMH: Past Medical History  Diagnosis Date  . Coronary artery disease   . Dementia   . Diabetes mellitus without complication (HCC)   . Hyperlipidemia   . Ischemic cardiomyopathy 01/05/2015  . Biventricular implantable cardioverter-defibrillator medtronic  01/05/2015      PSH: Past Surgical History  Procedure Laterality Date  . Pacemaker insertion    . Coronary artery bypass graft       CC:  Chief Complaint  Patient presents with  . Shortness of Breath     HPI: This is an 82 year old male patient with known history of aortic stenosis, history of ischemic cardiomyopathy now with preserved LV function based on most recent echo this past admission, significant pulmonary hypertension and associated diastolic heart failure. Because of previous low EF an ICD had been implanted, he also has dyslipidemia and dementia, as well as diabetes. He was discharged on 116 after an admission for syncope and collapse in setting of bradycardia, hypoglycemia and hypotension presumably related to overdiuresis and dehydration in setting of significant aortic stenosis. At discharge his Januvia and glipizide were discontinued as well as his Lopressor, lisinopril and Lasix. Recommendations were to follow the patient closely to determine when to resume medications primarily diuretic. Patient was brought back to the ER today and her shortness of breath and gurgling in his throat. He was trying to utilize Mucinex to see this would help. He reported productive cough of clear sputum. He denies any fevers chills or abdominal symptoms. During the last admission patient was found to have myoclonic jerking  versus tremors and was started on low-dose Depakote.  ER Evaluation and treatment: Afebrile, hypertensive with blood pressure readings between 144/64-159/94, heart rate 89, respirations 21 and room air saturations 99%-  at discharge patient's blood pressure was 117/59 Two-view chest x-ray: Slight increased pulmonary vascular congestion consistent with mild edema CT soft tissue neck with contrast: No pharyngeal masses noted, remote anterior wedge compression deformity T2 with 50% loss of height anteriorly; moderate-sized left and small right-sided pleural effusions Abnormal labs: Glucose 173, BNP 303, Procalcitonin less than 0.10, valproic acid 23 Lasix 40 mg IV 1 Nitrol paste 1 inch  Review of Systems   In addition to the HPI above,  No apparent Fever-chills, myalgias or other constitutional symptoms No Headache, changes with Vision or hearing, new weakness, tingling, numbness in any extremity, No problems swallowing food or Liquids, indigestion/reflux although he is complaining of gurgling in his throat No Chest pain, palpitations, orthopnea or DOE No Abdominal pain, N/V; no melena or hematochezia, no dark tarry stools No dysuria, hematuria or flank pain No new skin rashes, lesions, masses or bruises, No new joints pains-aches No recent weight gain or loss No polyuria, polydypsia or polyphagia,  *A full 10 point  Review of Systems was done, except as stated above, all other Review of Systems were negative.  Social History Social History  Substance Use Topics  . Smoking status: Former Smoker    Quit date: 03/30/1964  . Smokeless tobacco: Not on file  . Alcohol Use: No    Resides at: Private residence  Lives with: Nephew  Ambulatory status: Patient reports with assistance can utilize a rolling walker but has chronic dyspnea and exertion and states he doesn't ambulate very much   Family History Family History  Problem Relation Age of Onset  . Hypertension Other     Prior  to Admission medications   Medication Sig Start Date End Date Taking? Authorizing Provider  aspirin 81 MG tablet Take 81 mg by mouth daily with breakfast.     Historical Provider, MD  divalproex (DEPAKOTE SPRINKLE) 125 MG capsule Take 2 capsules (250 mg total) by mouth every 8 (eight) hours. 01/07/15   Shanker Levora Dredge, MD  fenofibrate (TRICOR) 145 MG tablet Take 145 mg by mouth at bedtime.     Historical Provider, MD    No Known Allergies  Physical Exam  Vitals  Blood pressure 159/94, pulse 105, temperature 98.1 F (36.7 C), temperature source Oral, resp. rate 22, weight 148 lb 5.9 oz (67.3 kg), SpO2 98 %.   General:  In no acute distress, appears chronically ill  Psych:  Normal affect, Denies Suicidal or Homicidal ideations, Awake Alert, Oriented X 3. Speech and thought patterns are clear and appropriate  Neuro:   No focal neurological deficits, CN II through XII intact, Strength 4/5 all 4 extremities, Sensation intact all 4 extremities.  ENT:  Ears and Eyes appear Normal, Conjunctivae clear, PER. Moist oral mucosa without erythema or exudates. Noted to be clearing throat and it seems to have pharyngeal congestion  Neck:  Supple, No lymphadenopathy appreciated  Respiratory:  Symmetrical chest wall movement, decreased in bases with crackles midfield on left . Room Air  Cardiac:  RRR, No Murmurs, 2+ bilateral LE edema noted, no JVD, No carotid bruits, peripheral pulses palpable at 2+  Abdomen:  Positive bowel sounds, Soft, Non tender, Non distended,  No masses appreciated, no obvious hepatosplenomegaly  Skin:  No Cyanosis, Normal Skin Turgor, No Skin Rash or Bruise.  Extremities: Symmetrical without obvious trauma or injury,  no effusions.  Data Review  CBC  Recent Labs Lab 04/14/15 0453 04/15/15 0604 04/20/15 1254 04/20/15 1302  WBC 4.7 4.4 6.9  --   HGB 10.1* 9.7* 11.6* 13.3  HCT 30.3* 29.4* 35.5* 39.0  PLT 114* 128* 164  --   MCV 97.7 96.7 97.5  --   MCH 32.6  31.9 31.9  --   MCHC 33.3 33.0 32.7  --   RDW 14.0 13.9 14.2  --   LYMPHSABS 1.2 1.2 1.1  --   MONOABS 0.7 0.5 0.7  --   EOSABS 0.2 0.3 0.1  --   BASOSABS 0.0 0.0 0.0  --     Chemistries   Recent Labs Lab 04/14/15 0453 04/15/15 0604 04/20/15 1302  NA 141 140 145  K 4.2 4.0 4.3  CL 112* 110 104  CO2 25 25  --   GLUCOSE 90 82 173*  BUN 22* 19 13  CREATININE 0.91 0.90 0.90  CALCIUM 8.4* 8.2*  --   MG 2.0 1.9  --   AST 27 30  --   ALT 18 17  --   ALKPHOS 34* 29*  --   BILITOT 0.5  0.7  --     estimated creatinine clearance is 51.2 mL/min (by C-G formula based on Cr of 0.9).  No results for input(s): TSH, T4TOTAL, T3FREE, THYROIDAB in the last 72 hours.  Invalid input(s): FREET3  Coagulation profile No results for input(s): INR, PROTIME in the last 168 hours.  No results for input(s): DDIMER in the last 72 hours.  Cardiac Enzymes No results for input(s): CKMB, TROPONINI, MYOGLOBIN in the last 168 hours.  Invalid input(s): CK  Invalid input(s): POCBNP  Urinalysis    Component Value Date/Time   COLORURINE YELLOW 04/10/2015 2136   APPEARANCEUR CLOUDY* 04/10/2015 2136   LABSPEC 1.011 04/10/2015 2136   PHURINE 5.0 04/10/2015 2136   GLUCOSEU NEGATIVE 04/10/2015 2136   HGBUR SMALL* 04/10/2015 2136   BILIRUBINUR NEGATIVE 04/10/2015 2136   KETONESUR NEGATIVE 04/10/2015 2136   PROTEINUR NEGATIVE 04/10/2015 2136   UROBILINOGEN 1.0 01/04/2015 1320   NITRITE NEGATIVE 04/10/2015 2136   LEUKOCYTESUR NEGATIVE 04/10/2015 2136    Imaging results:   Dg Chest 2 View  04/20/2015  CLINICAL DATA:  82 year old male with wheezing, shortness of breath chest EXAM: CHEST  2 VIEW COMPARISON:  Prior chest x-ray 04/13/2015 FINDINGS: Stable position of left subclavian approach biventricular cardiac rhythm maintenance device. The leads project over the right atrium, right ventricle and overlying the left ventricle. Cardiac and mediastinal contours are unchanged. Atherosclerotic  calcifications present in the transverse aorta. Patient is status post median sternotomy. Slightly increased pulmonary vascular congestion without overt edema. No pneumothorax. Small bilateral pleural effusions. Compression deformity of L1. No acute osseous abnormality. IMPRESSION: 1. Slightly increased pulmonary vascular congestion bordering on mild edema. 2. Small bilateral pleural effusions. 3. Stable cardiomegaly. 4. Age indeterminate L1 compression fracture.  Favor remote. Electronically Signed   By: Malachy Moan M.D.   On: 04/20/2015 13:48   Ct Soft Tissue Neck W Contrast  04/20/2015  CLINICAL DATA:  82 year old diabetic male with dementia coughing or choking to the large secretions. Initial encounter. EXAM: CT NECK WITH CONTRAST TECHNIQUE: Multidetector CT imaging of the neck was performed using the standard protocol following the bolus administration of intravenous contrast. CONTRAST:  75mL OMNIPAQUE IOHEXOL 300 MG/ML  SOLN COMPARISON:  None. FINDINGS: Pharynx and larynx: Negative. Salivary glands: Negative. Thyroid: Negative. Lymph nodes: No adenopathy. Vascular: Significant atherosclerotic changes including bilateral carotid bifurcation calcification with 62% diameter stenosis proximal right internal carotid artery and 64% diameter stenosis proximal left internal carotid artery. Moderate to marked narrowing right vertebral artery and mild to moderate narrowing left vertebral artery at the level of foramen magnum. Limited intracranial: Atrophy and small vessel disease changes. Visualized orbits: Limited evaluation and unremarkable. Mastoids and visualized paranasal sinuses: Minimal polypoid opacification inferior right maxillary sinus. Skeleton: Degenerative changes cervical spine most notable C3-4 and C5-6 with fusion C4-5. Remote anterior wedge compression deformity T2 with 50% loss height anteriorly. Upper chest: Small to slightly moderate-size left-sided and small right-sided pleural effusion  with adjacent mild atelectasis. Pacemaker in place. IMPRESSION: Exam limited by motion and dental artifact without pharyngeal mass noted. Bilateral carotid bifurcation calcification with 62% diameter stenosis proximal right internal carotid artery and 64% diameter stenosis proximal left internal carotid artery. Moderate to marked narrowing right vertebral artery and mild to moderate narrowing left vertebral artery at the level of foramen magnum. Degenerative changes cervical spine most notable C3-4 and C5-6 with fusion C4-5. Remote anterior wedge compression deformity T2 with 50% loss height anteriorly. Small to slightly moderate-size left-sided and small right-sided pleural effusion with adjacent  mild atelectasis. Electronically Signed   By: Lacy Duverney M.D.   On: 04/20/2015 15:20   Dg Chest Port 1 View  04/13/2015  CLINICAL DATA:  Pneumonia.  Short of breath. EXAM: PORTABLE CHEST 1 VIEW COMPARISON:  04/11/2015 FINDINGS: Heart remains mildly enlarged. Left subclavian AICD device is stable. Sternal wires are stable. Lungs under aerated and grossly clear. No pneumothorax. IMPRESSION: No active disease. Electronically Signed   By: Jolaine Click M.D.   On: 04/13/2015 10:21   Dg Chest Port 1 View  04/11/2015  CLINICAL DATA:  82 year old male with hypotension EXAM: PORTABLE CHEST 1 VIEW COMPARISON:  None. FINDINGS: Single-view of the chest does not demonstrate a focal consolidation. There is no pleural effusion or pneumothorax. Mild cardiomegaly. Left pectoral AICD device. Median sternotomy wires and CABG vascular clips. The osseous structures are grossly unremarkable with IMPRESSION: No acute cardiopulmonary process. Electronically Signed   By: Elgie Collard M.D.   On: 04/11/2015 01:36     EKG: (Independently reviewed)   Assessment & Plan  Principal Problem:   Acute diastolic heart failure, NYHA class 1 plus RHF /pulmonary hypertension/aortic stenosis -Currently not hypoxemic but now with lower  extremity edema, hypertension, x-ray evidence of edema as well as pleural effusions -Obs/Tele -Removed topical nitrate in setting of right heart failure and aortic stenosis -given a dose of Lasix in ER - plan to repeat 40 mg IV at 10 PM then have a.m. rounding physician determine if additional doses needed -Given history of recent hypotensive syncope 2/2 anti HTN meds and diuretic he may benefit from weight-based Lasix dosing instead of scheduled dosing -Resume beta blocker at low dose 12.5 mg twice a day monitoring for recurrent bradycardia -Resume previous lisinopril at 10 mg daily since renal function is normal -Strict intake and output and weights -Had echocardiogram last admission and no indication to repeat -With diuresis monitor for orthostasis and dizziness with activity  Active Problems:   History of ischemic cardiomyopathy -Echocardiogram 04/12/15 with recovered ejection fraction now 50 - 55% -Also with moderate TR, moderate to severe pulmonary hypertension and moderate dilatation of right ventricle with reduced systolic function    Diabetes mellitus -oral medications discontinued last admission after hypoglycemic episode  -CBG slightly elevated at 173 -Follow CBGs and provide SSI    Pleural effusion -Secondary to diastolic heart failure with right heart failure -Careful diuresis as above    Myoclonic jerking -Continue Depakote    History of dysphagia -MBS on 01/06/15 revealed moderate pharyngeal phase dysphagia and severe pharyngeal phase dysphagia with recommendations for D3 diet, no straw, medications crushed in puree, eat slowly with small bites and sips -This may explain patient's current complaints regarding his throat congestion -Repeat swallowing evaluation this admission    Biventricular implantable cardioverter-defibrillator medtronic     Dyslipidemia    Dementia    DVT Prophylaxis: Lovenox  Family Communication:  I did not speak with patient's nephew  [name]) but EDP did speak with him his number is [phone]   Code Status:  Partial code-DO NOT INTUBATE  Condition: Stable   Discharge disposition: Anticipate discharge back to home environment-if family has concerns regarding patient's ability to return to home environment recommend PT/OT evaluation prior to discharge  Time spent in minutes : 60      ELLIS,ALLISON L. ANP on 04/20/2015 at 5:07 PM  You may contact me by going to [email or web address] - password [email or web address]  I am available from 7a-7p but please confirm I am on the schedule by  going to Amion as above.   After 7p please contact night coverage person covering me after hours  [facility]

## 2015-04-21 ENCOUNTER — Observation Stay (HOSPITAL_COMMUNITY): Payer: Medicare Other

## 2015-04-21 DIAGNOSIS — I5031 Acute diastolic (congestive) heart failure: Secondary | ICD-10-CM | POA: Diagnosis not present

## 2015-04-21 LAB — TROPONIN I
TROPONIN I: 0.45 ng/mL — AB (ref <0.031)
TROPONIN I: 0.63 ng/mL — AB (ref <0.031)
TROPONIN I: 0.79 ng/mL — AB (ref <0.031)

## 2015-04-21 LAB — INFLUENZA PANEL BY PCR (TYPE A & B)
H1N1FLUPCR: NOT DETECTED
INFLAPCR: NEGATIVE
Influenza B By PCR: NEGATIVE

## 2015-04-21 LAB — BASIC METABOLIC PANEL
ANION GAP: 12 (ref 5–15)
BUN: 14 mg/dL (ref 6–20)
CHLORIDE: 101 mmol/L (ref 101–111)
CO2: 29 mmol/L (ref 22–32)
Calcium: 9.1 mg/dL (ref 8.9–10.3)
Creatinine, Ser: 1.29 mg/dL — ABNORMAL HIGH (ref 0.61–1.24)
GFR calc Af Amer: 55 mL/min — ABNORMAL LOW (ref >60)
GFR calc non Af Amer: 48 mL/min — ABNORMAL LOW (ref >60)
GLUCOSE: 132 mg/dL — AB (ref 65–99)
POTASSIUM: 5.2 mmol/L — AB (ref 3.5–5.1)
Sodium: 142 mmol/L (ref 135–145)

## 2015-04-21 LAB — GLUCOSE, CAPILLARY
GLUCOSE-CAPILLARY: 132 mg/dL — AB (ref 65–99)
GLUCOSE-CAPILLARY: 135 mg/dL — AB (ref 65–99)
GLUCOSE-CAPILLARY: 137 mg/dL — AB (ref 65–99)
Glucose-Capillary: 134 mg/dL — ABNORMAL HIGH (ref 65–99)
Glucose-Capillary: 112 mg/dL — ABNORMAL HIGH (ref 65–99)

## 2015-04-21 MED ORDER — FUROSEMIDE 10 MG/ML IJ SOLN
40.0000 mg | Freq: Once | INTRAMUSCULAR | Status: AC
  Administered 2015-04-21: 40 mg via INTRAVENOUS
  Filled 2015-04-21: qty 4

## 2015-04-21 MED ORDER — FUROSEMIDE 10 MG/ML IJ SOLN: 40.0000 mg | Freq: Once | INTRAMUSCULAR | Status: DC

## 2015-04-21 NOTE — Progress Notes (Signed)
CRITICAL VALUE ALERT  Critical value received:  Troponin 0.79  Date of notification:  04/21/2015  Time of notification:  0116  Critical value read back:Yes.    Nurse who received alert:  Renato Shin  MD notified (1st page):  K. Sofia  Time of first page:  0119  MD notified (2nd page):  Time of second page:  Responding MD:     Time MD responded:

## 2015-04-21 NOTE — Progress Notes (Addendum)
SLP Cancellation Note  Patient Details Name: Christopher Waller MRN: [id number] DOB: 02-28-1925   Cancelled treatment:       Reason Eval/Treat Not Completed: Other (comment): Pt with hx of mod-severe pharyngeal phase dysphagia from [facility] 12/2014. Pt with bilateral pleural effusions/ atelectasis on this admission. Recommend for pt to be NPO allowing meds crushed in puree/ repeat MBS to re-evaluate swallow function. Hope to complete MBS tomorrow a.m.   Metro Kung, MA, CCC-SLP 04/21/2015, 12:53 PM [phone]

## 2015-04-21 NOTE — Progress Notes (Signed)
[facility] PROGRESS NOTE  Christopher Waller ZOX:[id number] DOB: June 20, 1924 DOA: 04/20/2015 PCP: No PCP Per Patient  Length of stay:    Assessment/Plan: Principal Problem:   Acute diastolic heart failure, NYHA class 1 (HCC) Active Problems:   Myoclonic jerking   Dyslipidemia   Biventricular implantable cardioverter-defibrillator medtronic    History of ischemic cardiomyopathy   Dementia   Pulmonary hypertension (HCC)   Aortic stenosis   Pleural effusion   RHF (right heart failure) (HCC)   Diabetes mellitus, stable (HCC)   Acute diastolic heart failure (HCC)    Brief summary 82 y.o. male PMHx DM Type 2, Chronic Systolic CHF, Dilated Cardiomyopathy, Pulmonary HTN, , CAD S/P CABG, S/P Biventricular implantable cardioverter-defibrillator medtronic,CKD , now with preserved LV function ( measured EF 35-40%- now found to be improved to 50-55%) , significant pulmonary hypertension and associated diastolic heart failure. Because of previous low EF an ICD had been implanted, he also has dyslipidemia and dementia, as well as diabetes. He was discharged on 116 after an admission for syncope and collapse in setting of bradycardia, hypoglycemia and hypotension presumably related to overdiuresis and dehydration in setting of significant aortic stenosis. At discharge his Januvia and glipizide were discontinued as well as his Lopressor, lisinopril and Lasix. Recommendations were to follow the patient closely to determine when to resume medications primarily diuretic. Patient was brought back to the ER today and  Presents with shortness of breath and gurgling in his throat. He was trying to utilize Mucinex to see this would help. He reported productive cough of clear sputum. He denies any fevers chills or abdominal symptoms. During the last admission patient was found to have myoclonic jerking versus tremors and was started on low-dose Depakote. CT neck showed bilateral carotid bifurcation  calcification with stenosis of the proximal right and left internal carotid artery, moderate left-sided pleural effusion   Assessment and plan Acute diastolic heart failure, NYHA class 1 plus RHF /pulmonary hypertension/aortic stenosis -Currently not hypoxemic but now with lower extremity edema, hypertension, x-ray evidence of edema as well as pleural effusions, flu  Negative  -Removed topical nitrate in setting of right heart failure and aortic stenosis Patient received a couple of doses of Lasix yesterday, will repeat -Given history of recent hypotensive syncope 2/2 anti HTN meds and diuretic he may benefit from weight-based Lasix dosing instead of scheduled dosing Cont  low dose metoprolol 12.5 mg twice a day monitoring for recurrent bradycardia Continue to hold lisinopril in the setting of diuresis and increasing creatinine -Strict intake and output and weights -Had echocardiogram last admission and no indication to repeat -With diuresis monitor for orthostasis and dizziness with activity    History of ischemic cardiomyopathy -Echocardiogram 04/12/15 with recovered ejection fraction now 50 - 55% -Also with moderate TR, moderate to severe pulmonary hypertension and moderate dilatation of right ventricle with reduced systolic function   Diabetes mellitus -oral medications discontinued last admission after hypoglycemic episode  -CBG slightly elevated at 173 -Follow CBGs and provide SSI   Pleural effusion -Secondary to diastolic heart failure with right heart failure -Careful diuresis as above   Myoclonic jerking -Continue Depakote, and psychiatry consultation during last admission 1/15 for confusion, deemed not to have capacity to make any decisions for himself    History of dysphagia -MBS on 01/06/15 revealed moderate pharyngeal phase dysphagia and severe pharyngeal phase dysphagia with recommendations for D3 diet, no straw, medications crushed in puree, eat slowly with small  bites and sips -  This may explain patient's current complaints regarding his throat congestion -Repeat swallowing evaluation this admission   Biventricular implantable cardioverter-defibrillator medtronic followed by Dr. Graciela Husbands   Dyslipidemia   Dementia-palliative care consultation for goals of care, recurrent admissions for weakness, dementia, confusion,dysphagia    DVT prophylaxsis  lovenox   Code Status:      Code Status Orders    Limited code no intubation    Start     Ordered     Family Communication: Discussed in detail with the patient, all imaging results, lab results explained to the patient   Disposition Plan: 2-3 days      Consultants:  None  Procedures:  None  Antibiotics: Anti-infectives    None         HPI/Subjective: Complaining of mucus, chest congestion  Objective: Filed Vitals:   04/20/15 1715 04/20/15 1851 04/21/15 0700 04/21/15 0744  BP: 155/81 156/92 118/73   Pulse:  117 95   Temp:  97.3 F (36.3 C) 97.4 F (36.3 C)   TempSrc:  Oral Oral   Resp:  22 22   Height:  5\' 7"  (1.702 m)    Weight:  63.8 kg (140 lb 10.5 oz)  61.8 kg (136 lb 3.9 oz)  SpO2: 98% 99% 99%     Intake/Output Summary (Last 24 hours) at 04/21/15 1610 Last data filed at 04/21/15 0006  Gross per 24 hour  Intake    120 ml  Output    500 ml  Net   -380 ml    Exam:  General: In no acute distress, appears chronically ill  Psych: Normal affect, Denies Suicidal or Homicidal ideations, Awake Alert, Oriented X 3. Speech and thought patterns are clear and appropriate  Neuro: No focal neurological deficits, CN II through XII intact, Strength 4/5 all 4 extremities, Sensation intact all 4 extremities.  ENT: Ears and Eyes appear Normal, Conjunctivae clear, PER. Moist oral mucosa without erythema or exudates. Noted to be clearing throat and it seems to have pharyngeal congestion  Neck: Supple, No lymphadenopathy appreciated  Respiratory: Symmetrical chest  wall movement, decreased in bases with crackles [place] on left . Room Air  Cardiac: RRR, No Murmurs, 2+ bilateral LE edema noted, no JVD, No carotid bruits, peripheral pulses palpable at 2+  Abdomen: Positive bowel sounds, Soft, Non tender, Non distended, No masses appreciated, no obvious hepatosplenomegaly  Skin: No Cyanosis, Normal Skin Turgor, No Skin Rash or Bruise.   Data Review   Micro Results No results found for this or any previous visit (from the past 240 hour(s)).  Radiology Reports Dg Chest 2 View  04/20/2015  CLINICAL DATA:  82 year old male with wheezing, shortness of breath chest EXAM: CHEST  2 VIEW COMPARISON:  Prior chest x-ray 04/13/2015 FINDINGS: Stable position of left subclavian approach biventricular cardiac rhythm maintenance device. The leads project over the right atrium, right ventricle and overlying the left ventricle. Cardiac and mediastinal contours are unchanged. Atherosclerotic calcifications present in the transverse aorta. Patient is status post median sternotomy. Slightly increased pulmonary vascular congestion without overt edema. No pneumothorax. Small bilateral pleural effusions. Compression deformity of L1. No acute osseous abnormality. IMPRESSION: 1. Slightly increased pulmonary vascular congestion bordering on mild edema. 2. Small bilateral pleural effusions. 3. Stable cardiomegaly. 4. Age indeterminate L1 compression fracture.  Favor remote. Electronically Signed   By: Malachy Moan M.D.   On: 04/20/2015 13:48   Ct Soft Tissue Neck W Contrast  04/20/2015  CLINICAL DATA:  82 year old diabetic male with dementia  coughing or choking to the large secretions. Initial encounter. EXAM: CT NECK WITH CONTRAST TECHNIQUE: Multidetector CT imaging of the neck was performed using the standard protocol following the bolus administration of intravenous contrast. CONTRAST:  75mL OMNIPAQUE IOHEXOL 300 MG/ML  SOLN COMPARISON:  None. FINDINGS: Pharynx and larynx:  Negative. Salivary glands: Negative. Thyroid: Negative. Lymph nodes: No adenopathy. Vascular: Significant atherosclerotic changes including bilateral carotid bifurcation calcification with 62% diameter stenosis proximal right internal carotid artery and 64% diameter stenosis proximal left internal carotid artery. Moderate to marked narrowing right vertebral artery and mild to moderate narrowing left vertebral artery at the level of foramen magnum. Limited intracranial: Atrophy and small vessel disease changes. Visualized orbits: Limited evaluation and unremarkable. Mastoids and visualized paranasal sinuses: Minimal polypoid opacification inferior right maxillary sinus. Skeleton: Degenerative changes cervical spine most notable C3-4 and C5-6 with fusion C4-5. Remote anterior wedge compression deformity T2 with 50% loss height anteriorly. Upper chest: Small to slightly moderate-size left-sided and small right-sided pleural effusion with adjacent mild atelectasis. Pacemaker in place. IMPRESSION: Exam limited by motion and dental artifact without pharyngeal mass noted. Bilateral carotid bifurcation calcification with 62% diameter stenosis proximal right internal carotid artery and 64% diameter stenosis proximal left internal carotid artery. Moderate to marked narrowing right vertebral artery and mild to moderate narrowing left vertebral artery at the level of foramen magnum. Degenerative changes cervical spine most notable C3-4 and C5-6 with fusion C4-5. Remote anterior wedge compression deformity T2 with 50% loss height anteriorly. Small to slightly moderate-size left-sided and small right-sided pleural effusion with adjacent mild atelectasis. Electronically Signed   By: Lacy Duverney M.D.   On: 04/20/2015 15:20   Dg Chest Port 1 View  04/13/2015  CLINICAL DATA:  Pneumonia.  Short of breath. EXAM: PORTABLE CHEST 1 VIEW COMPARISON:  04/11/2015 FINDINGS: Heart remains mildly enlarged. Left subclavian AICD device is  stable. Sternal wires are stable. Lungs under aerated and grossly clear. No pneumothorax. IMPRESSION: No active disease. Electronically Signed   By: Jolaine Click M.D.   On: 04/13/2015 10:21   Dg Chest Port 1 View  04/11/2015  CLINICAL DATA:  82 year old male with hypotension EXAM: PORTABLE CHEST 1 VIEW COMPARISON:  None. FINDINGS: Single-view of the chest does not demonstrate a focal consolidation. There is no pleural effusion or pneumothorax. Mild cardiomegaly. Left pectoral AICD device. Median sternotomy wires and CABG vascular clips. The osseous structures are grossly unremarkable with IMPRESSION: No acute cardiopulmonary process. Electronically Signed   By: Elgie Collard M.D.   On: 04/11/2015 01:36     CBC  Recent Labs Lab 04/15/15 0604 04/20/15 1254 04/20/15 1302  WBC 4.4 6.9  --   HGB 9.7* 11.6* 13.3  HCT 29.4* 35.5* 39.0  PLT 128* 164  --   MCV 96.7 97.5  --   MCH 31.9 31.9  --   MCHC 33.0 32.7  --   RDW 13.9 14.2  --   LYMPHSABS 1.2 1.1  --   MONOABS 0.5 0.7  --   EOSABS 0.3 0.1  --   BASOSABS 0.0 0.0  --     Chemistries   Recent Labs Lab 04/15/15 0604 04/20/15 1302 04/21/15 0619  NA 140 145 142  K 4.0 4.3 5.2*  CL 110 104 101  CO2 25  --  29  GLUCOSE 82 173* 132*  BUN CREATININE 0.90 0.90 1.29*  CALCIUM 8.2*  --  9.1  MG 1.9  --   --   AST 30  --   --  ALT 17  --   --   ALKPHOS 29*  --   --   BILITOT 0.7  --   --    ------------------------------------------------------------------------------------------------------------------ estimated creatinine clearance is 34.6 mL/min (by C-G formula based on Cr of 1.29). ------------------------------------------------------------------------------------------------------------------ No results for input(s): HGBA1C in the last 72 hours. ------------------------------------------------------------------------------------------------------------------ No results for input(s): CHOL, HDL, LDLCALC, TRIG,  CHOLHDL, LDLDIRECT in the last 72 hours. ------------------------------------------------------------------------------------------------------------------ No results for input(s): TSH, T4TOTAL, T3FREE, THYROIDAB in the last 72 hours.  Invalid input(s): FREET3 ------------------------------------------------------------------------------------------------------------------ No results for input(s): VITAMINB12, FOLATE, FERRITIN, TIBC, IRON, RETICCTPCT in the last 72 hours.  Coagulation profile No results for input(s): INR, PROTIME in the last 168 hours.  No results for input(s): DDIMER in the last 72 hours.  Cardiac Enzymes  Recent Labs Lab 04/20/15 1946 04/21/15 0037 04/21/15 0619  TROPONINI 0.26* 0.79* 0.63*   ------------------------------------------------------------------------------------------------------------------ Invalid input(s): POCBNP   CBG:  Recent Labs Lab 04/15/15 1205 04/20/15 1303 04/20/15 1719 04/20/15 2357 04/21/15 0449  GLUCAP 115* 172* 156* 134* 112*       Studies: Dg Chest 2 View  04/20/2015  CLINICAL DATA:  82 year old male with wheezing, shortness of breath chest EXAM: CHEST  2 VIEW COMPARISON:  Prior chest x-ray 04/13/2015 FINDINGS: Stable position of left subclavian approach biventricular cardiac rhythm maintenance device. The leads project over the right atrium, right ventricle and overlying the left ventricle. Cardiac and mediastinal contours are unchanged. Atherosclerotic calcifications present in the transverse aorta. Patient is status post median sternotomy. Slightly increased pulmonary vascular congestion without overt edema. No pneumothorax. Small bilateral pleural effusions. Compression deformity of L1. No acute osseous abnormality. IMPRESSION: 1. Slightly increased pulmonary vascular congestion bordering on mild edema. 2. Small bilateral pleural effusions. 3. Stable cardiomegaly. 4. Age indeterminate L1 compression fracture.  Favor  remote. Electronically Signed   By: Malachy Moan M.D.   On: 04/20/2015 13:48   Ct Soft Tissue Neck W Contrast  04/20/2015  CLINICAL DATA:  82 year old diabetic male with dementia coughing or choking to the large secretions. Initial encounter. EXAM: CT NECK WITH CONTRAST TECHNIQUE: Multidetector CT imaging of the neck was performed using the standard protocol following the bolus administration of intravenous contrast. CONTRAST:  75mL OMNIPAQUE IOHEXOL 300 MG/ML  SOLN COMPARISON:  None. FINDINGS: Pharynx and larynx: Negative. Salivary glands: Negative. Thyroid: Negative. Lymph nodes: No adenopathy. Vascular: Significant atherosclerotic changes including bilateral carotid bifurcation calcification with 62% diameter stenosis proximal right internal carotid artery and 64% diameter stenosis proximal left internal carotid artery. Moderate to marked narrowing right vertebral artery and mild to moderate narrowing left vertebral artery at the level of foramen magnum. Limited intracranial: Atrophy and small vessel disease changes. Visualized orbits: Limited evaluation and unremarkable. Mastoids and visualized paranasal sinuses: Minimal polypoid opacification inferior right maxillary sinus. Skeleton: Degenerative changes cervical spine most notable C3-4 and C5-6 with fusion C4-5. Remote anterior wedge compression deformity T2 with 50% loss height anteriorly. Upper chest: Small to slightly moderate-size left-sided and small right-sided pleural effusion with adjacent mild atelectasis. Pacemaker in place. IMPRESSION: Exam limited by motion and dental artifact without pharyngeal mass noted. Bilateral carotid bifurcation calcification with 62% diameter stenosis proximal right internal carotid artery and 64% diameter stenosis proximal left internal carotid artery. Moderate to marked narrowing right vertebral artery and mild to moderate narrowing left vertebral artery at the level of foramen magnum. Degenerative changes  cervical spine most notable C3-4 and C5-6 with fusion C4-5. Remote anterior wedge compression deformity T2 with 50% loss height anteriorly. Small  to slightly moderate-size left-sided and small right-sided pleural effusion with adjacent mild atelectasis. Electronically Signed   By: Lacy Duverney M.D.   On: 04/20/2015 15:20      Lab Results  Component Value Date   HGBA1C 8.2* 01/04/2015   Lab Results  Component Value Date   CREATININE 1.29* 04/21/2015       Scheduled Meds: . aspirin  81 mg Oral Daily  . divalproex  250 mg Oral 3 times per day  . enoxaparin (LOVENOX) injection  40 mg Subcutaneous Q24H  . fenofibrate  54 mg Oral Daily  . guaiFENesin  600 mg Oral BID  . insulin aspart  0-9 Units Subcutaneous 6 times per day  . lisinopril  10 mg Oral Daily  . metoprolol tartrate  12.5 mg Oral BID  . sodium chloride  3 mL Intravenous Q12H   Continuous Infusions:   Principal Problem:   Acute diastolic heart failure, NYHA class 1 (HCC) Active Problems:   Myoclonic jerking   Dyslipidemia   Biventricular implantable cardioverter-defibrillator medtronic    History of ischemic cardiomyopathy   Dementia   Pulmonary hypertension (HCC)   Aortic stenosis   Pleural effusion   RHF (right heart failure) (HCC)   Diabetes mellitus, stable (HCC)   Acute diastolic heart failure (HCC)    Time spent: 45 minutes   [facility]  [facility] Pager [phone]. If 7PM-7AM, please contact night-coverage at [email or web address], password [facility] 04/21/2015, 9:23 AM

## 2015-04-22 ENCOUNTER — Observation Stay (HOSPITAL_COMMUNITY): Payer: Medicare Other

## 2015-04-22 DIAGNOSIS — Z8679 Personal history of other diseases of the circulatory system: Secondary | ICD-10-CM

## 2015-04-22 DIAGNOSIS — R1310 Dysphagia, unspecified: Secondary | ICD-10-CM | POA: Diagnosis not present

## 2015-04-22 DIAGNOSIS — F0390 Unspecified dementia without behavioral disturbance: Secondary | ICD-10-CM | POA: Diagnosis not present

## 2015-04-22 DIAGNOSIS — Z95810 Presence of automatic (implantable) cardiac defibrillator: Secondary | ICD-10-CM

## 2015-04-22 DIAGNOSIS — I5031 Acute diastolic (congestive) heart failure: Secondary | ICD-10-CM | POA: Diagnosis not present

## 2015-04-22 DIAGNOSIS — Z515 Encounter for palliative care: Secondary | ICD-10-CM | POA: Diagnosis not present

## 2015-04-22 DIAGNOSIS — I272 Other secondary pulmonary hypertension: Secondary | ICD-10-CM

## 2015-04-22 DIAGNOSIS — I5033 Acute on chronic diastolic (congestive) heart failure: Secondary | ICD-10-CM

## 2015-04-22 DIAGNOSIS — F0391 Unspecified dementia with behavioral disturbance: Secondary | ICD-10-CM

## 2015-04-22 DIAGNOSIS — I350 Nonrheumatic aortic (valve) stenosis: Secondary | ICD-10-CM | POA: Diagnosis not present

## 2015-04-22 DIAGNOSIS — E785 Hyperlipidemia, unspecified: Secondary | ICD-10-CM | POA: Diagnosis not present

## 2015-04-22 LAB — CBC
HEMATOCRIT: 35.4 % — AB (ref 39.0–52.0)
Hemoglobin: 11.6 g/dL — ABNORMAL LOW (ref 13.0–17.0)
MCH: 31.6 pg (ref 26.0–34.0)
MCHC: 32.8 g/dL (ref 30.0–36.0)
MCV: 96.5 fL (ref 78.0–100.0)
PLATELETS: 158 10*3/uL (ref 150–400)
RBC: 3.67 MIL/uL — ABNORMAL LOW (ref 4.22–5.81)
RDW: 14.2 % (ref 11.5–15.5)
WBC: 7.9 10*3/uL (ref 4.0–10.5)

## 2015-04-22 LAB — COMPREHENSIVE METABOLIC PANEL
ALBUMIN: 2.4 g/dL — AB (ref 3.5–5.0)
ALT: 24 U/L (ref 17–63)
AST: 43 U/L — AB (ref 15–41)
Alkaline Phosphatase: 43 U/L (ref 38–126)
Anion gap: 12 (ref 5–15)
BILIRUBIN TOTAL: 1 mg/dL (ref 0.3–1.2)
BUN: 18 mg/dL (ref 6–20)
CHLORIDE: 101 mmol/L (ref 101–111)
CO2: 32 mmol/L (ref 22–32)
CREATININE: 1.31 mg/dL — AB (ref 0.61–1.24)
Calcium: 8.9 mg/dL (ref 8.9–10.3)
GFR calc Af Amer: 54 mL/min — ABNORMAL LOW (ref >60)
GFR, EST NON AFRICAN AMERICAN: 47 mL/min — AB (ref >60)
GLUCOSE: 102 mg/dL — AB (ref 65–99)
POTASSIUM: 3.3 mmol/L — AB (ref 3.5–5.1)
Sodium: 145 mmol/L (ref 135–145)
Total Protein: 5.4 g/dL — ABNORMAL LOW (ref 6.5–8.1)

## 2015-04-22 LAB — C DIFFICILE QUICK SCREEN W PCR REFLEX
C DIFFICILE (CDIFF) INTERP: NEGATIVE
C Diff antigen: NEGATIVE
C Diff toxin: NEGATIVE

## 2015-04-22 LAB — GLUCOSE, CAPILLARY
GLUCOSE-CAPILLARY: 100 mg/dL — AB (ref 65–99)
GLUCOSE-CAPILLARY: 120 mg/dL — AB (ref 65–99)
GLUCOSE-CAPILLARY: 137 mg/dL — AB (ref 65–99)
GLUCOSE-CAPILLARY: 138 mg/dL — AB (ref 65–99)
Glucose-Capillary: 129 mg/dL — ABNORMAL HIGH (ref 65–99)
Glucose-Capillary: 134 mg/dL — ABNORMAL HIGH (ref 65–99)

## 2015-04-22 LAB — PROCALCITONIN

## 2015-04-22 MED ORDER — FUROSEMIDE 10 MG/ML IJ SOLN
20.0000 mg | Freq: Once | INTRAMUSCULAR | Status: AC
  Administered 2015-04-22: 20 mg via INTRAVENOUS
  Filled 2015-04-22: qty 2

## 2015-04-22 MED ORDER — FUROSEMIDE 40 MG PO TABS
40.0000 mg | ORAL_TABLET | Freq: Every day | ORAL | Status: DC
  Administered 2015-04-22 – 2015-04-25 (×4): 40 mg via ORAL
  Filled 2015-04-22 (×4): qty 1

## 2015-04-22 MED ORDER — POTASSIUM CHLORIDE 10 MEQ/100ML IV SOLN
10.0000 meq | INTRAVENOUS | Status: AC
  Administered 2015-04-22 (×3): 10 meq via INTRAVENOUS
  Filled 2015-04-22 (×3): qty 100

## 2015-04-22 NOTE — Consult Note (Signed)
Consultation Note Date: 04/22/2015   Patient Name: Christopher Waller  DOB: [birth year]  MRN: [id number]  Age / Sex: 82 y.o., male  PCP: No Pcp Per Patient Referring Physician: Reyne Dumas, MD  Reason for Consultation: Establishing goals of care    Clinical Assessment/Narrative: I met with Mr. Mathey today who knows he is in the hospital but not why or what is medically going on with him. He is happy and content and says "I feel good." Very forgetful.  I also spoke to his nephew, Evette Doffing. We had long discuss about dysphagia and dementia and how this is a terminal and end stage symptom. He says that Mr. Ryser was ambulating a little but incontinent and still recognized family members but very forgetful and confused otherwise. I reiterated the poor prognosis with severe dysphagia and Evette Doffing confirms that his uncle would not want a feeding tube. However, Evette Doffing struggles to see any path other than continued care. He alludes to other family members deaths and it seems there is a pattern of aggressive care and hospital deaths. Although Evette Doffing does say that his uncle is 46 yo and understands that he will not live long. He had many questions that were addressed and is more concerned about when his uncle is returning home and "adjusting his diet to make it more safe" although I said numerous times that there is severe risk with any texture at this point. Evette Doffing will speak with his wife and we will discuss again tomorrow. Unable to address code status today. Will follow up tomorrow to further discuss comfort feeds and the option of comfort care (which is recommended).   Contacts/Participants in Discussion: Primary Decision Maker: Georgina Snell   Relationship to Patient nephew HCPOA: yes  Have not seen paperwork  SUMMARY [place] seems to understand that his uncle's dysphagia is a terminal diagnosis but I do  not believe he understands that there are no options for improvement/making this more safe at this point - Evette Doffing did not seem to understand the value of taking more a comfort path or the use of hospice - will discuss more tomorrow - Unable to discuss code status today Evette Doffing had to go) - plan to revisit tomorrow  Code Status/Advance Care Planning: Limited code    Code Status Orders        Start     Ordered   04/20/15 2004  Limited resuscitation (code)   Continuous    Question Answer Comment  In the event of cardiac or respiratory ARREST: Initiate Code Blue, Call Rapid Response Yes   In the event of cardiac or respiratory ARREST: Perform CPR Yes   In the event of cardiac or respiratory ARREST: Perform Intubation/Mechanical Ventilation No   In the event of cardiac or respiratory ARREST: Use NIPPV/BiPAp only if indicated Yes   In the event of cardiac or respiratory ARREST: Administer ACLS medications if indicated Yes   In the event of cardiac or respiratory ARREST: Perform Defibrillation or Cardioversion if indicated Yes      04/20/15 2003    Code Status History    Date Active Date Inactive Code Status Order ID Comments User Context   04/11/2015  2:50 AM 04/15/2015  6:11 PM Partial Code [id number]  Rise Patience, MD Inpatient   01/06/2015  1:15 PM 01/08/2015  5:57 PM Partial Code [id number]  Jonetta Osgood, MD Inpatient   01/04/2015  6:53 PM 01/06/2015  1:15 PM Full Code [id number]  Domenic Polite, MD Inpatient  Symptom Management:   Dysphagia: Evette Doffing says his uncle would not want feeding tube which I do not recommend anyways. However not sure they completely understand the risks of comfort feeds and what this means.   Palliative Prophylaxis:   Bowel Regimen, Delirium Protocol and Oral Care  Additional Recommendations (Limitations, Scope, Preferences):  No Artificial Feeding  Psycho-social/Spiritual:  Support System: Adequate Desire for further Chaplaincy  support:no Additional Recommendations: Caregiving  Support/Resources, Education on hospice  Prognosis: < 6 months  Discharge Planning: Recommend home with hospice.    Chief Complaint/ Primary Diagnoses: Present on Admission:  . Acute diastolic heart failure, NYHA class 1 ([place]) . Biventricular implantable cardioverter-defibrillator medtronic  . Pulmonary hypertension ([place]) . Dementia . Dyslipidemia . Myoclonic jerking . Pleural effusion . Acute diastolic heart failure ([place])  I have reviewed the medical record, interviewed the patient and family, and examined the patient. The following aspects are pertinent.  Past Medical History  Diagnosis Date  . Coronary artery disease   . Dementia   . Diabetes mellitus without complication ([place])   . Hyperlipidemia   . Ischemic cardiomyopathy 01/05/2015  . Biventricular implantable cardioverter-defibrillator medtronic  01/05/2015   Social History   Social History  . Marital Status: Unknown    Spouse Name: N/A  . Number of Children: N/A  . Years of Education: N/A   Social History Main Topics  . Smoking status: Former Smoker    Quit date: 03/30/1964  . Smokeless tobacco: None  . Alcohol Use: No  . Drug Use: None  . Sexual Activity: Not Currently   Other Topics Concern  . None   Social History Narrative   Family History  Problem Relation Age of Onset  . Hypertension Other    Scheduled Meds: . aspirin  81 mg Oral Daily  . divalproex  250 mg Oral 3 times per day  . enoxaparin (LOVENOX) injection  40 mg Subcutaneous Q24H  . fenofibrate  54 mg Oral Daily  . guaiFENesin  600 mg Oral BID  . insulin aspart  0-9 Units Subcutaneous 6 times per day  . metoprolol tartrate  12.5 mg Oral BID  . potassium chloride  10 mEq Intravenous Q1 Hr x 4  . sodium chloride  3 mL Intravenous Q12H   Continuous Infusions:  PRN Meds:.sodium chloride, acetaminophen, ondansetron (ZOFRAN) IV, sodium chloride Medications Prior to Admission:  Prior to  Admission medications   Medication Sig Start Date End Date Taking? Authorizing Provider  aspirin 81 MG tablet Take 81 mg by mouth daily with breakfast.    Yes Historical Provider, MD  divalproex (DEPAKOTE SPRINKLE) 125 MG capsule Take 2 capsules (250 mg total) by mouth every 8 (eight) hours. 01/07/15  Yes Shanker Kristeen Mans, MD  fenofibrate (TRICOR) 145 MG tablet Take 145 mg by mouth at bedtime.    Yes Historical Provider, MD   No Known Allergies  Review of Systems  Unable to perform ROS   Physical Exam  Constitutional: He appears well-developed.  HENT:  Head: Normocephalic and atraumatic.  Cardiovascular: Normal rate.   Respiratory: Effort normal. No accessory muscle usage. No tachypnea. No respiratory distress.  GI: Soft. Normal appearance. There is no tenderness.  Neurological: He is alert. He is disoriented.    Vital Signs: BP 107/61 mmHg  Pulse 83  Temp(Src) 98.2 F (36.8 C) (Oral)  Resp 18  Ht 5' 7"  (1.702 m)  Wt 60.9 kg (134 lb 4.2 oz)  BMI 21.02 kg/m2  SpO2 100%  SpO2: SpO2: 100 %  O2 Device:SpO2: 100 % O2 Flow Rate: .O2 Flow Rate (L/min): 2 L/min  IO: Intake/output summary:  Intake/Output Summary (Last 24 hours) at 04/22/15 1344 Last data filed at 04/22/15 1331  Gross per 24 hour  Intake    240 ml  Output    200 ml  Net     40 ml    LBM: Last BM Date: 04/15/15 Baseline Weight: Weight: 67.3 kg (148 lb 5.9 oz) Most recent weight: Weight: 60.9 kg (134 lb 4.2 oz)      Palliative Assessment/Data:    Additional Data Reviewed:  CBC:    Component Value Date/Time   WBC 7.9 04/22/2015 0506   HGB 11.6* 04/22/2015 0506   HCT 35.4* 04/22/2015 0506   PLT 158 04/22/2015 0506   MCV 96.5 04/22/2015 0506   NEUTROABS 5.0 04/20/2015 1254   LYMPHSABS 1.1 04/20/2015 1254   MONOABS 0.7 04/20/2015 1254   EOSABS 0.1 04/20/2015 1254   BASOSABS 0.0 04/20/2015 1254   Comprehensive Metabolic Panel:    Component Value Date/Time   NA 145 04/22/2015 0506   K 3.3*  04/22/2015 0506   CL 101 04/22/2015 0506   CO2 32 04/22/2015 0506   BUN 18 04/22/2015 0506   CREATININE 1.31* 04/22/2015 0506   GLUCOSE 102* 04/22/2015 0506   CALCIUM 8.9 04/22/2015 0506   AST 43* 04/22/2015 0506   ALT 24 04/22/2015 0506   ALKPHOS 43 04/22/2015 0506   BILITOT 1.0 04/22/2015 0506   PROT 5.4* 04/22/2015 0506   ALBUMIN 2.4* 04/22/2015 0506     Time In: 1250 Time Out: 1350 Time Total: 60mn Greater than 50%  of this time was spent counseling and coordinating care related to the above assessment and plan.  Signed by: PPershing Proud NP  APershing Proud NP  16/14/7092 1:44 PM  Please contact Palliative Medicine Team phone at [phone] questions and concerns.

## 2015-04-22 NOTE — Progress Notes (Signed)
[facility] PROGRESS NOTE  Hanish Laraia ZOX:[id number] DOB: 09-23-1924 DOA: 04/20/2015 PCP: No PCP Per Patient  Length of stay:    Assessment/Plan: Principal Problem:   Acute diastolic heart failure, NYHA class 1 (HCC) Active Problems:   Myoclonic jerking   Dyslipidemia   Biventricular implantable cardioverter-defibrillator medtronic    History of ischemic cardiomyopathy   Dementia   Pulmonary hypertension (HCC)   Aortic stenosis   Pleural effusion   RHF (right heart failure) (HCC)   Diabetes mellitus, stable (HCC)   Acute diastolic heart failure (HCC)    Brief summary 82 y.o. male PMHx DM Type 2, Chronic Systolic CHF, Dilated Cardiomyopathy, Pulmonary HTN, , CAD S/P CABG, S/P Biventricular implantable cardioverter-defibrillator medtronic,CKD , now with preserved LV function ( measured EF 35-40%- now found to be improved to 50-55%) , significant pulmonary hypertension and associated diastolic heart failure. Because of previous low EF an ICD had been implanted, he also has dyslipidemia and dementia, as well as diabetes. He was discharged on 116 after an admission for syncope and collapse in setting of bradycardia, hypoglycemia and hypotension presumably related to overdiuresis and dehydration in setting of significant aortic stenosis. At discharge his Januvia and glipizide were discontinued as well as his Lopressor, lisinopril and Lasix. Recommendations were to follow the patient closely to determine when to resume medications primarily diuretic. Patient was brought back to the ER today and  Presents with shortness of breath and gurgling in his throat. He was trying to utilize Mucinex to see this would help. He reported productive cough of clear sputum. He denies any fevers chills or abdominal symptoms. During the last admission patient was found to have myoclonic jerking versus tremors and was started on low-dose Depakote. CT neck showed bilateral carotid bifurcation  calcification with stenosis of the proximal right and left internal carotid artery, moderate left-sided pleural effusion   Assessment and plan Acute diastolic heart failure, NYHA class 1 plus RHF /pulmonary hypertension/aortic stenosis -Currently not hypoxemic but now with lower extremity edema, hypertension, x-ray evidence of edema as well as pleural effusions, flu  Negative , abnormal troponin, peak  0.79, trending down -Removed topical nitrate in setting of right heart failure and aortic stenosis Continue Lasix 40 mg IV daily, cardiology to adjust History of recent hypotensive syncope 2/2 anti HTN meds and diuretic Cont  low dose metoprolol 12.5 mg twice a day monitoring for recurrent bradycardia Continue to hold lisinopril in the setting of diuresis and increasing creatinine -Strict intake and output and weights -Had echocardiogram last admission and no indication to repeat -With diuresis monitor for orthostasis and dizziness with activity PT OT evaluation Cardiology consultation for further recommendations   Dysphagia Patient had MBS  this morning Noted to have Severe pharyngeal phase dysphagia with decline  Speech therapy recommends NPO pending palliative care recommendations -This may explain patient's current complaints regarding his throat congestion  CK D stage III, creatinine stable    History of ischemic cardiomyopathy -Echocardiogram 04/12/15 with recovered ejection fraction now 50 - 55% -Also with moderate TR, moderate to severe pulmonary hypertension and moderate dilatation of right ventricle with reduced systolic function   Diabetes mellitus-stable  -oral medications discontinued last admission after hypoglycemic episode  -CBG slightly elevated at 173 -Follow CBGs and provide SSI   Pleural effusion -Secondary to diastolic heart failure with right heart failure -Careful diuresis as above   Myoclonic jerking -Continue Depakote, and psychiatry consultation  during last admission 1/15 for confusion, deemed not  to have capacity to make any decisions for himself   Hypokalemia-replete    Biventricular implantable cardioverter-defibrillator medtronic followed by Dr. Graciela Husbands   Dyslipidemia -stable    Dementia-palliative care consultation for goals of care, recurrent admissions for weakness, dementia, confusion,dysphagia    DVT prophylaxsis  lovenox   Code Status:      Code Status Orders    Limited code no intubation    Start     Ordered     Family Communication: Discussed in detail with the patient, all imaging results, lab results explained to the patient   Disposition Plan: 2-3 days      Consultants:  None  Procedures:  None  Antibiotics: Anti-infectives    None         HPI/Subjective: More confused today than yesterday, with bed alarms and hand mittens   Objective: Filed Vitals:   04/21/15 1200 04/21/15 1314 04/21/15 1940 04/22/15 0446  BP: 136/82 125/94 124/95 118/91  Pulse: 84 92 98 102  Temp: 98.1 F (36.7 C) 98.1 F (36.7 C) 98.6 F (37 C) 98 F (36.7 C)  TempSrc: Oral Oral Oral Axillary  Resp: Height:      Weight:    60.9 kg (134 lb 4.2 oz)  SpO2: 100% 100% 100% 92%    Intake/Output Summary (Last 24 hours) at 04/22/15 0915 Last data filed at 04/22/15 1610  Gross per 24 hour  Intake    240 ml  Output    200 ml  Net     40 ml    Exam:  General: confused   Psych: Normal affect, Denies Suicidal or Homicidal ideations, Awake Alert, Oriented X 3. Speech and thought patterns are clear and appropriate  Neuro: No focal neurological deficits, CN II through XII intact, Strength 4/5 all 4 extremities, Sensation intact all 4 extremities.  ENT: Ears and Eyes appear Normal, Conjunctivae clear, PER. Moist oral mucosa without erythema or exudates. Noted to be clearing throat and it seems to have pharyngeal congestion  Neck: Supple, No lymphadenopathy appreciated  Respiratory:  Symmetrical chest wall movement, decreased in bases with crackles midfield on left . Room Air  Cardiac: RRR, No Murmurs, 2+ bilateral LE edema noted, no JVD, No carotid bruits, peripheral pulses palpable at 2+  Abdomen: Positive bowel sounds, Soft, Non tender, Non distended, No masses appreciated, no obvious hepatosplenomegaly  Skin: No Cyanosis, Normal Skin Turgor, No Skin Rash or Bruise.   Data Review   Micro Results No results found for this or any previous visit (from the past 240 hour(s)).  Radiology Reports Dg Chest 2 View  04/21/2015  CLINICAL DATA:  Acute diastolic heart failure EXAM: CHEST  2 VIEW COMPARISON:  04/20/2015 FINDINGS: Cardiomegaly again noted. Three leads cardiac pacemaker is unchanged in position. Persistent mild interstitial prominence bilateral probable mild interstitial edema. Again noted small bilateral pleural effusion with bilateral basilar atelectasis. Atherosclerotic calcifications of thoracic aorta. Osteopenia and degenerative changes thoracolumbar spine. Stable compression deformity upper lumbar spine. IMPRESSION: Three leads cardiac pacemaker is unchanged in position. Persistent mild interstitial prominence bilateral probable mild interstitial edema. Again noted small bilateral pleural effusion with bilateral basilar atelectasis. Electronically Signed   By: Natasha Mead M.D.   On: 04/21/2015 09:22   Dg Chest 2 View  04/20/2015  CLINICAL DATA:  82 year old male with wheezing, shortness of breath chest EXAM: CHEST  2 VIEW COMPARISON:  Prior chest x-ray 04/13/2015 FINDINGS: Stable position of left subclavian approach biventricular cardiac rhythm maintenance device.  The leads project over the right atrium, right ventricle and overlying the left ventricle. Cardiac and mediastinal contours are unchanged. Atherosclerotic calcifications present in the transverse aorta. Patient is status post median sternotomy. Slightly increased pulmonary vascular congestion without  overt edema. No pneumothorax. Small bilateral pleural effusions. Compression deformity of L1. No acute osseous abnormality. IMPRESSION: 1. Slightly increased pulmonary vascular congestion bordering on mild edema. 2. Small bilateral pleural effusions. 3. Stable cardiomegaly. 4. Age indeterminate L1 compression fracture.  Favor remote. Electronically Signed   By: Malachy Moan M.D.   On: 04/20/2015 13:48   Ct Soft Tissue Neck W Contrast  04/20/2015  CLINICAL DATA:  82 year old diabetic male with dementia coughing or choking to the large secretions. Initial encounter. EXAM: CT NECK WITH CONTRAST TECHNIQUE: Multidetector CT imaging of the neck was performed using the standard protocol following the bolus administration of intravenous contrast. CONTRAST:  75mL OMNIPAQUE IOHEXOL 300 MG/ML  SOLN COMPARISON:  None. FINDINGS: Pharynx and larynx: Negative. Salivary glands: Negative. Thyroid: Negative. Lymph nodes: No adenopathy. Vascular: Significant atherosclerotic changes including bilateral carotid bifurcation calcification with 62% diameter stenosis proximal right internal carotid artery and 64% diameter stenosis proximal left internal carotid artery. Moderate to marked narrowing right vertebral artery and mild to moderate narrowing left vertebral artery at the level of foramen magnum. Limited intracranial: Atrophy and small vessel disease changes. Visualized orbits: Limited evaluation and unremarkable. Mastoids and visualized paranasal sinuses: Minimal polypoid opacification inferior right maxillary sinus. Skeleton: Degenerative changes cervical spine most notable C3-4 and C5-6 with fusion C4-5. Remote anterior wedge compression deformity T2 with 50% loss height anteriorly. Upper chest: Small to slightly moderate-size left-sided and small right-sided pleural effusion with adjacent mild atelectasis. Pacemaker in place. IMPRESSION: Exam limited by motion and dental artifact without pharyngeal mass noted. Bilateral  carotid bifurcation calcification with 62% diameter stenosis proximal right internal carotid artery and 64% diameter stenosis proximal left internal carotid artery. Moderate to marked narrowing right vertebral artery and mild to moderate narrowing left vertebral artery at the level of foramen magnum. Degenerative changes cervical spine most notable C3-4 and C5-6 with fusion C4-5. Remote anterior wedge compression deformity T2 with 50% loss height anteriorly. Small to slightly moderate-size left-sided and small right-sided pleural effusion with adjacent mild atelectasis. Electronically Signed   By: Lacy Duverney M.D.   On: 04/20/2015 15:20   Dg Chest Port 1 View  04/13/2015  CLINICAL DATA:  Pneumonia.  Short of breath. EXAM: PORTABLE CHEST 1 VIEW COMPARISON:  04/11/2015 FINDINGS: Heart remains mildly enlarged. Left subclavian AICD device is stable. Sternal wires are stable. Lungs under aerated and grossly clear. No pneumothorax. IMPRESSION: No active disease. Electronically Signed   By: Jolaine Click M.D.   On: 04/13/2015 10:21   Dg Chest Port 1 View  04/11/2015  CLINICAL DATA:  82 year old male with hypotension EXAM: PORTABLE CHEST 1 VIEW COMPARISON:  None. FINDINGS: Single-view of the chest does not demonstrate a focal consolidation. There is no pleural effusion or pneumothorax. Mild cardiomegaly. Left pectoral AICD device. Median sternotomy wires and CABG vascular clips. The osseous structures are grossly unremarkable with IMPRESSION: No acute cardiopulmonary process. Electronically Signed   By: Elgie Collard M.D.   On: 04/11/2015 01:36     CBC  Recent Labs Lab 04/20/15 1254 04/20/15 1302 04/22/15 0506  WBC 6.9  --  7.9  HGB 11.6* 13.3 11.6*  HCT 35.5* 39.0 35.4*  PLT 164  --  158  MCV 97.5  --  96.5  MCH 31.9  --  31.6  MCHC 32.7  --  32.8  RDW 14.2  --  14.2  LYMPHSABS 1.1  --   --   MONOABS 0.7  --   --   EOSABS 0.1  --   --   BASOSABS 0.0  --   --     Chemistries   Recent  Labs Lab 04/20/15 1302 04/21/15 0619 04/22/15 0506  NA 145 142 145  K 4.3 5.2* 3.3*  CL 104 101 101  CO2  --  29 32  GLUCOSE 173* 132* 102*  BUN CREATININE 0.90 1.29* 1.31*  CALCIUM  --  9.1 8.9  AST  --   --  43*  ALT  --   --  24  ALKPHOS  --   --  43  BILITOT  --   --  1.0   ------------------------------------------------------------------------------------------------------------------ estimated creatinine clearance is 33.6 mL/min (by C-G formula based on Cr of 1.31). ------------------------------------------------------------------------------------------------------------------ No results for input(s): HGBA1C in the last 72 hours. ------------------------------------------------------------------------------------------------------------------ No results for input(s): CHOL, HDL, LDLCALC, TRIG, CHOLHDL, LDLDIRECT in the last 72 hours. ------------------------------------------------------------------------------------------------------------------ No results for input(s): TSH, T4TOTAL, T3FREE, THYROIDAB in the last 72 hours.  Invalid input(s): FREET3 ------------------------------------------------------------------------------------------------------------------ No results for input(s): VITAMINB12, FOLATE, FERRITIN, TIBC, IRON, RETICCTPCT in the last 72 hours.  Coagulation profile No results for input(s): INR, PROTIME in the last 168 hours.  No results for input(s): DDIMER in the last 72 hours.  Cardiac Enzymes  Recent Labs Lab 04/21/15 0037 04/21/15 0619 04/21/15 1120  TROPONINI 0.79* 0.63* 0.45*   ------------------------------------------------------------------------------------------------------------------ Invalid input(s): POCBNP   CBG:  Recent Labs Lab 04/21/15 1714 04/21/15 2056 04/22/15 0010 04/22/15 0409 04/22/15 0811  GLUCAP 135* 137* 134* 100* 120*       Studies: Dg Chest 2 View  04/21/2015  CLINICAL DATA:  Acute  diastolic heart failure EXAM: CHEST  2 VIEW COMPARISON:  04/20/2015 FINDINGS: Cardiomegaly again noted. Three leads cardiac pacemaker is unchanged in position. Persistent mild interstitial prominence bilateral probable mild interstitial edema. Again noted small bilateral pleural effusion with bilateral basilar atelectasis. Atherosclerotic calcifications of thoracic aorta. Osteopenia and degenerative changes thoracolumbar spine. Stable compression deformity upper lumbar spine. IMPRESSION: Three leads cardiac pacemaker is unchanged in position. Persistent mild interstitial prominence bilateral probable mild interstitial edema. Again noted small bilateral pleural effusion with bilateral basilar atelectasis. Electronically Signed   By: Natasha Mead M.D.   On: 04/21/2015 09:22   Dg Chest 2 View  04/20/2015  CLINICAL DATA:  82 year old male with wheezing, shortness of breath chest EXAM: CHEST  2 VIEW COMPARISON:  Prior chest x-ray 04/13/2015 FINDINGS: Stable position of left subclavian approach biventricular cardiac rhythm maintenance device. The leads project over the right atrium, right ventricle and overlying the left ventricle. Cardiac and mediastinal contours are unchanged. Atherosclerotic calcifications present in the transverse aorta. Patient is status post median sternotomy. Slightly increased pulmonary vascular congestion without overt edema. No pneumothorax. Small bilateral pleural effusions. Compression deformity of L1. No acute osseous abnormality. IMPRESSION: 1. Slightly increased pulmonary vascular congestion bordering on mild edema. 2. Small bilateral pleural effusions. 3. Stable cardiomegaly. 4. Age indeterminate L1 compression fracture.  Favor remote. Electronically Signed   By: Malachy Moan M.D.   On: 04/20/2015 13:48   Ct Soft Tissue Neck W Contrast  04/20/2015  CLINICAL DATA:  82 year old diabetic male with dementia coughing or choking to the large secretions. Initial encounter. EXAM: CT NECK  WITH CONTRAST TECHNIQUE: Multidetector CT imaging of the neck was  performed using the standard protocol following the bolus administration of intravenous contrast. CONTRAST:  75mL OMNIPAQUE IOHEXOL 300 MG/ML  SOLN COMPARISON:  None. FINDINGS: Pharynx and larynx: Negative. Salivary glands: Negative. Thyroid: Negative. Lymph nodes: No adenopathy. Vascular: Significant atherosclerotic changes including bilateral carotid bifurcation calcification with 62% diameter stenosis proximal right internal carotid artery and 64% diameter stenosis proximal left internal carotid artery. Moderate to marked narrowing right vertebral artery and mild to moderate narrowing left vertebral artery at the level of foramen magnum. Limited intracranial: Atrophy and small vessel disease changes. Visualized orbits: Limited evaluation and unremarkable. Mastoids and visualized paranasal sinuses: Minimal polypoid opacification inferior right maxillary sinus. Skeleton: Degenerative changes cervical spine most notable C3-4 and C5-6 with fusion C4-5. Remote anterior wedge compression deformity T2 with 50% loss height anteriorly. Upper chest: Small to slightly moderate-size left-sided and small right-sided pleural effusion with adjacent mild atelectasis. Pacemaker in place. IMPRESSION: Exam limited by motion and dental artifact without pharyngeal mass noted. Bilateral carotid bifurcation calcification with 62% diameter stenosis proximal right internal carotid artery and 64% diameter stenosis proximal left internal carotid artery. Moderate to marked narrowing right vertebral artery and mild to moderate narrowing left vertebral artery at the level of foramen magnum. Degenerative changes cervical spine most notable C3-4 and C5-6 with fusion C4-5. Remote anterior wedge compression deformity T2 with 50% loss height anteriorly. Small to slightly moderate-size left-sided and small right-sided pleural effusion with adjacent mild atelectasis. Electronically  Signed   By: Lacy Duverney M.D.   On: 04/20/2015 15:20      Lab Results  Component Value Date   HGBA1C 8.2* 01/04/2015   Lab Results  Component Value Date   CREATININE 1.31* 04/22/2015       Scheduled Meds: . aspirin  81 mg Oral Daily  . divalproex  250 mg Oral 3 times per day  . enoxaparin (LOVENOX) injection  40 mg Subcutaneous Q24H  . fenofibrate  54 mg Oral Daily  . furosemide  40 mg Intravenous Once  . guaiFENesin  600 mg Oral BID  . insulin aspart  0-9 Units Subcutaneous 6 times per day  . metoprolol tartrate  12.5 mg Oral BID  . sodium chloride  3 mL Intravenous Q12H   Continuous Infusions:   Principal Problem:   Acute diastolic heart failure, NYHA class 1 (HCC) Active Problems:   Myoclonic jerking   Dyslipidemia   Biventricular implantable cardioverter-defibrillator medtronic    History of ischemic cardiomyopathy   Dementia   Pulmonary hypertension (HCC)   Aortic stenosis   Pleural effusion   RHF (right heart failure) (HCC)   Diabetes mellitus, stable (HCC)   Acute diastolic heart failure (HCC)    Time spent: 45 minutes   [facility]  [facility] Pager [phone]. If 7PM-7AM, please contact night-coverage at [email or web address], password [facility] 04/22/2015, 9:15 AM

## 2015-04-22 NOTE — Progress Notes (Signed)
Pt has had 3 loose stools today.  Called physician and received and order to place pt on enteric precautions in order to r/o C-diff.  Sample has been delivered to lab.  Awaiting results.
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
## 2015-04-22 NOTE — Progress Notes (Signed)
Speech Language Pathology     Patient Details Name: Christopher Waller MRN: [id number] DOB: 10/09/1924 Today's Date: 04/22/2015 Time: 7829-5621 SLP Time Calculation (min) (ACUTE ONLY): 19 min     MBS complete and full report to follow. Severe pharyngeal phase dysphagia with decline from [facility] 12/2014. Mod-max pharyngeal residue unable to clear with multiple and effortful swallows and various consistencies with silent penetration and aspiration from residue. Currently pt unsafe for any po's. Dr. Susie Cassette notified who will discuss with family [id number]. Will follow.      Breck Coons Priddy.Ed [employer] [phone]

## 2015-04-22 NOTE — Progress Notes (Deleted)
Palliative:   Full note to follow. I met with Christopher Waller today who knows he is in the hospital but not why or what is medically going on with him. He is happy and content and says "I feel good." Very forgetful.  I also spoke to his nephew, Christopher Waller. We had long discuss about dysphagia and dementia and how this is a terminal and end stage symptom. He says that [name] was ambulating a little but incontinent and still recognized family members but very forgetful and confused otherwise. I reiterated the poor prognosis with severe dysphagia and Christopher Waller confirms that his uncle would not want a feeding tube. However, Christopher Waller struggles to see any path other than continued care. He had many questions that were addressed and is more concerned about when his uncle is returning home and "adjusting his diet to make it more safe" although I said numerous times that there is severe risk with any texture at this point. Christopher Waller will speak with his wife and we will discuss again tomorrow. Unable to address code status today. Will follow up tomorrow to further discuss comfort feeds and the option of comfort care (which is recommended).   Vinie Sill, NP Palliative Medicine Team Pager # [phone] (M-F 8a-5p) Team Phone # [phone] (Nights/Weekends)

## 2015-04-22 NOTE — Consult Note (Signed)
CARDIOLOGY CONSULT NOTE   Patient ID: Christopher Waller MRN: [id number], DOB/AGE: June 22, 1924   Admit date: 04/20/2015 Date of Consult: 04/22/2015  Primary Physician: No PCP Per Patient Primary Cardiologist: ? In [place]  Reason for consult:  CHF  Problem List  Past Medical History  Diagnosis Date  . Coronary artery disease   . Dementia   . Diabetes mellitus without complication (HCC)   . Hyperlipidemia   . Ischemic cardiomyopathy 01/05/2015  . Biventricular implantable cardioverter-defibrillator medtronic  01/05/2015    Past Surgical History  Procedure Laterality Date  . Pacemaker insertion    . Coronary artery bypass graft      Allergies  No Known Allergies  HPI   This is an 82 year old male patient with known history of mild aortic stenosis, CAD, s/p CABG,  history of ischemic cardiomyopathy, s/p BiV ICD placement now with preserved LV function based on most recent echo this past admission, significant pulmonary hypertension and associated diastolic heart failure. Because of previous low EF an ICD had been implanted, he also has dyslipidemia and dementia, as well as diabetes. He was discharged on 1/16 after an admission for syncope and collapse in setting of bradycardia, hypoglycemia and hypotension presumably related to overdiuresis and dehydration in setting of significant aortic stenosis. At discharge his Januvia and glipizide were discontinued as well as his Lopressor, lisinopril and Lasix. Recommendations were to follow the patient closely to determine when to resume medications primarily diuretic. Patient was brought back to the ER on 04/20/15 with shortness of breath. He was trying to utilize Mucinex to see this would help. He reported productive cough of clear sputum. He denies any fevers chills or abdominal symptoms. During the last admission patient was found to have myoclonic jerking versus tremors and was started on low-dose Depakote.  The patient is a very poor  historian and currently doesn't know why he is here, where he lives and denies any symptoms.   Inpatient Medications  . aspirin  81 mg Oral Daily  . divalproex  250 mg Oral 3 times per day  . enoxaparin (LOVENOX) injection  40 mg Subcutaneous Q24H  . fenofibrate  54 mg Oral Daily  . guaiFENesin  600 mg Oral BID  . insulin aspart  0-9 Units Subcutaneous 6 times per day  . metoprolol tartrate  12.5 mg Oral BID  . potassium chloride  10 mEq Intravenous Q1 Hr x 4  . sodium chloride  3 mL Intravenous Q12H   Family History Family History  Problem Relation Age of Onset  . Hypertension Other     Social History Social History   Social History  . Marital Status: Unknown    Spouse Name: N/A  . Number of Children: N/A  . Years of Education: N/A   Occupational History  . Not on file.   Social History Main Topics  . Smoking status: Former Smoker    Quit date: 03/30/1964  . Smokeless tobacco: Not on file  . Alcohol Use: No  . Drug Use: Not on file  . Sexual Activity: Not Currently   Other Topics Concern  . Not on file   Social History Narrative    Review of Systems  General:  No chills, fever, night sweats or weight changes.  Cardiovascular:  No chest pain, dyspnea on exertion, edema, orthopnea, palpitations, paroxysmal nocturnal dyspnea. Dermatological: No rash, lesions/masses Respiratory: No cough, dyspnea Urologic: No hematuria, dysuria Abdominal:   No nausea, vomiting, diarrhea, bright red blood per rectum, melena, or hematemesis  Neurologic:  No visual changes, wkns, changes in mental status. All other systems reviewed and are otherwise negative except as noted above.  Physical Exam  Blood pressure 107/61, pulse 83, temperature 98.2 F (36.8 C), temperature source Oral, resp. rate 18, height  (1.702 m), weight 134 lb 4.2 oz (60.9 kg), SpO2 100 %.  General: Pleasant, AAO x 1 Psych: Normal affect. Neuro: Alert and oriented X 3. Moves all extremities  spontaneously. HEENT: Normal  Neck: Supple without bruits or JVD. Lungs:  Resp regular and unlabored, crackles at bases Heart: RRR no s3, s4, or murmurs. Abdomen: Soft, non-tender, non-distended, BS + x 4.  Extremities: No clubbing, cyanosis or edema. DP/PT/Radials 2+ and equal bilaterally.  Labs  Recent Labs  04/20/15 1946 04/21/15 0037 04/21/15 0619 04/21/15 1120  TROPONINI 0.26* 0.79* 0.63* 0.45*   Lab Results  Component Value Date   WBC 7.9 04/22/2015   HGB 11.6* 04/22/2015   HCT 35.4* 04/22/2015   MCV 96.5 04/22/2015   PLT 158 04/22/2015    Recent Labs Lab 04/22/15 0506  NA 145  K 3.3*  CL 101  CO2 32  BUN 18  CREATININE 1.31*  CALCIUM 8.9  PROT 5.4*  BILITOT 1.0  ALKPHOS 43  ALT 24  AST 43*  GLUCOSE 102*   Radiology/Studies  Dg Chest 2 View  04/21/2015  CLINICAL DATA:  Acute diastolic heart failure EXAM: CHEST  2 VIEW COMPARISON:   IMPRESSION: Three leads cardiac pacemaker is unchanged in position. Persistent mild interstitial prominence bilateral probable mild interstitial edema. Again noted small bilateral pleural effusion with bilateral basilar atelectasis.   Ct Soft Tissue Neck W Contrast  04/20/2015  CLINICAL DATA:  82 year old diabetic male with dementia coughing or choking to the large secretions. Initial encounter. IMPRESSION: Exam limited by motion and dental artifact without pharyngeal mass noted. Bilateral carotid bifurcation calcification with 62% diameter stenosis proximal right internal carotid artery and 64% diameter stenosis proximal left internal carotid artery. Moderate to marked narrowing right vertebral artery and mild to moderate narrowing left vertebral artery at the level of foramen magnum. Degenerative changes cervical spine most notable C3-4 and C5-6 with fusion C4-5. Remote anterior wedge compression deformity T2 with 50% loss height anteriorly. Small to slightly moderate-size left-sided and small right-sided pleural effusion with  adjacent mild atelectasis.   Echocardiogram - 04/12/2015 - Left ventricle: The cavity size was normal. Wall thickness was normal. Systolic function was low normal. The estimated ejection fraction was in the range of 50% to 55%. Wall motion was normal; there were no regional wall motion abnormalities. The study is not technically sufficient to allow evaluation of LV diastolic function. - Ventricular septum: The contour showed diastolic flattening and systolic flattening. - Aortic valve: Calcified leaflets with restricted excursion. Moderate aortic stenosis. AVA around 1-1.1 cm2. There was no regurgitation. Mean gradient (S): 12 mm Hg. Peak gradient (S): 25 mm Hg. Valve area (VTI): 0.97 cm^2. Valve area (Vmax): 1.05 cm^2. Valve area (Vmean): 1.03 cm^2. - Mitral valve: Calcified annulus. Mildly thickened leaflets . There was mild regurgitation. - Left atrium: Moderately dilated at 45 ml/m2. - Right ventricle: The cavity size was moderately dilated. Systolic function is reduced. - Right atrium: The atrium was mildly dilated. - Tricuspid valve: There was moderate regurgitation. - Pulmonary arteries: PA peak pressure: 53 mm Hg (S). - Systemic veins: The IVC measures >2.1 cm, but collapses more than 50%, suggesting an elevated RA pressure of 8 mmHg.  Impressions:  - Compared to a prior  study in 12/2014, the EF has improved to 50-55%. There is moderate aortic stenosis with AVA around 1-1.1 cm2. The RVSP is essentially unchanged at 53 mmHg with moderate TR.  ECG: AV sequential pacing     ASSESSMENT AND PLAN  1. Acute diastolic heart failure, NYHA class 3  - start lasix 40 mg po daily for now and 20 mg po daily at discharge  2. Ischemic cardiomyopathy, LVEF 50-55% - continue metoprolol - s/p BiV -ICD placement, 100% paced - the patient has advanced dementia, he states that he doesn't have a cardiologist, we will schedule a follow up in our clinic  after the discharge  3. CAD, s/p CABG, elevated troponin - most probably demand ischemia sec to CHF - considering dementia, advanced age and acute kidney failure not a candidate for a cath - continue ASA and metoprolol  4. Biventricular implantable cardioverter-defibrillator medtronic - as above  5. Dyslipidemia - on fenofibrate, would avoid statins considering advanced age and dementia.    Signed, Lars Masson, MD, [facility] 04/22/2015, 12:33 PM

## 2015-04-22 NOTE — Progress Notes (Signed)
Speech Pathology  MBSS complete. Full report located under chart review in imaging section. See DG swallow function. Recommendations:  CHL IP CLINICAL IMPRESSIONS 04/22/2015  Therapy Diagnosis Severe pharyngeal phase dysphagia;Moderate pharyngeal phase dysphagia;Mild oral phase dysphagia  Clinical Impression Pt's swallow function has deteriorated from MBS in 12/2014. Tongue base retraction, laryngeal elevation significantly decreased resulting in mod-max vallecular and pyriform sinus residue. Therapeutic strategies were unsuccessful to decrease pharyngeal residue to a safe amount and pt silently penetrated and aspirated in between and during swallows. Thin and nectar thick barium yielded similar volumes of pharyngeal residue. Brief esophageal scan did not reveal overt abnormalities (MBS does not diagnose below level of UES). Pt has chronic dysphagia and currently is at high risk with all consistencies. Results discussed with Dr. Susie Cassette who stated she will discuss wishes for nutrition with family. Will follow up for any further education.        Impact on safety and function Severe aspiration risk     Royce Macadamia M.Ed [employer] [phone]

## 2015-04-23 DIAGNOSIS — Z515 Encounter for palliative care: Secondary | ICD-10-CM | POA: Diagnosis not present

## 2015-04-23 DIAGNOSIS — I2583 Coronary atherosclerosis due to lipid rich plaque: Secondary | ICD-10-CM

## 2015-04-23 DIAGNOSIS — D649 Anemia, unspecified: Secondary | ICD-10-CM | POA: Diagnosis present

## 2015-04-23 DIAGNOSIS — E1122 Type 2 diabetes mellitus with diabetic chronic kidney disease: Secondary | ICD-10-CM | POA: Diagnosis present

## 2015-04-23 DIAGNOSIS — I5031 Acute diastolic (congestive) heart failure: Secondary | ICD-10-CM

## 2015-04-23 DIAGNOSIS — R1310 Dysphagia, unspecified: Secondary | ICD-10-CM | POA: Insufficient documentation

## 2015-04-23 DIAGNOSIS — G253 Myoclonus: Secondary | ICD-10-CM | POA: Diagnosis present

## 2015-04-23 DIAGNOSIS — Z8249 Family history of ischemic heart disease and other diseases of the circulatory system: Secondary | ICD-10-CM | POA: Diagnosis not present

## 2015-04-23 DIAGNOSIS — I420 Dilated cardiomyopathy: Secondary | ICD-10-CM | POA: Diagnosis present

## 2015-04-23 DIAGNOSIS — E876 Hypokalemia: Secondary | ICD-10-CM | POA: Diagnosis not present

## 2015-04-23 DIAGNOSIS — N183 Chronic kidney disease, stage 3 (moderate): Secondary | ICD-10-CM | POA: Diagnosis present

## 2015-04-23 DIAGNOSIS — I130 Hypertensive heart and chronic kidney disease with heart failure and stage 1 through stage 4 chronic kidney disease, or unspecified chronic kidney disease: Secondary | ICD-10-CM | POA: Diagnosis present

## 2015-04-23 DIAGNOSIS — Z7982 Long term (current) use of aspirin: Secondary | ICD-10-CM | POA: Diagnosis not present

## 2015-04-23 DIAGNOSIS — F0390 Unspecified dementia without behavioral disturbance: Secondary | ICD-10-CM

## 2015-04-23 DIAGNOSIS — Z87891 Personal history of nicotine dependence: Secondary | ICD-10-CM | POA: Diagnosis not present

## 2015-04-23 DIAGNOSIS — E785 Hyperlipidemia, unspecified: Secondary | ICD-10-CM | POA: Diagnosis present

## 2015-04-23 DIAGNOSIS — I2510 Atherosclerotic heart disease of native coronary artery without angina pectoris: Secondary | ICD-10-CM

## 2015-04-23 DIAGNOSIS — R0602 Shortness of breath: Secondary | ICD-10-CM | POA: Diagnosis present

## 2015-04-23 DIAGNOSIS — I272 Other secondary pulmonary hypertension: Secondary | ICD-10-CM | POA: Diagnosis present

## 2015-04-23 DIAGNOSIS — N179 Acute kidney failure, unspecified: Secondary | ICD-10-CM | POA: Diagnosis not present

## 2015-04-23 DIAGNOSIS — I255 Ischemic cardiomyopathy: Secondary | ICD-10-CM | POA: Diagnosis present

## 2015-04-23 DIAGNOSIS — I350 Nonrheumatic aortic (valve) stenosis: Secondary | ICD-10-CM | POA: Diagnosis not present

## 2015-04-23 DIAGNOSIS — R1313 Dysphagia, pharyngeal phase: Secondary | ICD-10-CM | POA: Diagnosis present

## 2015-04-23 DIAGNOSIS — Z95810 Presence of automatic (implantable) cardiac defibrillator: Secondary | ICD-10-CM | POA: Diagnosis not present

## 2015-04-23 DIAGNOSIS — I082 Rheumatic disorders of both aortic and tricuspid valves: Secondary | ICD-10-CM | POA: Diagnosis present

## 2015-04-23 DIAGNOSIS — E1165 Type 2 diabetes mellitus with hyperglycemia: Secondary | ICD-10-CM | POA: Diagnosis present

## 2015-04-23 DIAGNOSIS — I5043 Acute on chronic combined systolic (congestive) and diastolic (congestive) heart failure: Secondary | ICD-10-CM | POA: Diagnosis present

## 2015-04-23 DIAGNOSIS — Z951 Presence of aortocoronary bypass graft: Secondary | ICD-10-CM | POA: Diagnosis not present

## 2015-04-23 DIAGNOSIS — I248 Other forms of acute ischemic heart disease: Secondary | ICD-10-CM | POA: Diagnosis present

## 2015-04-23 LAB — COMPREHENSIVE METABOLIC PANEL
ALBUMIN: 2.4 g/dL — AB (ref 3.5–5.0)
ALK PHOS: 42 U/L (ref 38–126)
ALT: 24 U/L (ref 17–63)
AST: 46 U/L — ABNORMAL HIGH (ref 15–41)
Anion gap: 11 (ref 5–15)
BUN: 31 mg/dL — ABNORMAL HIGH (ref 6–20)
CO2: 31 mmol/L (ref 22–32)
CREATININE: 1.53 mg/dL — AB (ref 0.61–1.24)
Calcium: 8.6 mg/dL — ABNORMAL LOW (ref 8.9–10.3)
Chloride: 105 mmol/L (ref 101–111)
GFR calc non Af Amer: 39 mL/min — ABNORMAL LOW (ref >60)
GFR, EST AFRICAN AMERICAN: 45 mL/min — AB (ref >60)
GLUCOSE: 127 mg/dL — AB (ref 65–99)
Potassium: 3.5 mmol/L (ref 3.5–5.1)
SODIUM: 147 mmol/L — AB (ref 135–145)
Total Bilirubin: 1.5 mg/dL — ABNORMAL HIGH (ref 0.3–1.2)
Total Protein: 5.7 g/dL — ABNORMAL LOW (ref 6.5–8.1)

## 2015-04-23 LAB — GLUCOSE, CAPILLARY
GLUCOSE-CAPILLARY: 107 mg/dL — AB (ref 65–99)
GLUCOSE-CAPILLARY: 118 mg/dL — AB (ref 65–99)
Glucose-Capillary: 121 mg/dL — ABNORMAL HIGH (ref 65–99)
Glucose-Capillary: 118 mg/dL — ABNORMAL HIGH (ref 65–99)
Glucose-Capillary: 120 mg/dL — ABNORMAL HIGH (ref 65–99)
Glucose-Capillary: 114 mg/dL — ABNORMAL HIGH (ref 65–99)

## 2015-04-23 LAB — CBC
HEMATOCRIT: 35.3 % — AB (ref 39.0–52.0)
Hemoglobin: 11.5 g/dL — ABNORMAL LOW (ref 13.0–17.0)
MCH: 31.9 pg (ref 26.0–34.0)
MCHC: 32.6 g/dL (ref 30.0–36.0)
MCV: 97.8 fL (ref 78.0–100.0)
PLATELETS: 153 10*3/uL (ref 150–400)
RBC: 3.61 MIL/uL — AB (ref 4.22–5.81)
RDW: 14.1 % (ref 11.5–15.5)
WBC: 5.4 10*3/uL (ref 4.0–10.5)

## 2015-04-23 MED ORDER — POTASSIUM CHLORIDE 10 MEQ/100ML IV SOLN
10.0000 meq | INTRAVENOUS | Status: AC
  Administered 2015-04-23: 10 meq via INTRAVENOUS
  Filled 2015-04-23: qty 100

## 2015-04-23 MED ORDER — FUROSEMIDE 10 MG/ML IJ SOLN: 40.0000 mg | Freq: Once | INTRAMUSCULAR | Status: DC

## 2015-04-23 MED ORDER — POTASSIUM CHLORIDE 10 MEQ/100ML IV SOLN
10.0000 meq | INTRAVENOUS | Status: AC
  Administered 2015-04-23 (×3): 10 meq via INTRAVENOUS
  Filled 2015-04-23 (×3): qty 100
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
## 2015-04-23 NOTE — Progress Notes (Signed)
Subjective: Very pleasant.  No particular complaints.  Eager to go home.   Objective: Vital signs in last 24 hours: Temp:  [97.9 F (36.6 C)-98.4 F (36.9 C)] 97.9 F (36.6 C) (01/24 0504) Pulse Rate:  [79-104] 83 (01/24 0504) Resp:  [18-21] 21 (01/24 0504) BP: (101-143)/(53-93) 143/93 mmHg (01/24 0504) SpO2:  [93 %-100 %] 93 % (01/24 0504) Weight:  [131 lb 2.8 oz (59.5 kg)] 131 lb 2.8 oz (59.5 kg) (01/24 0504) Last BM Date: 04/15/15  Intake/Output from previous day: 01/23 0701 - 01/24 0700 In: 600 [I.V.:300; IV Piggyback:300] Out: 700 [Urine:700] Intake/Output this shift:    Medications Scheduled Meds: . aspirin  81 mg Oral Daily  . divalproex  250 mg Oral 3 times per day  . enoxaparin (LOVENOX) injection  40 mg Subcutaneous Q24H  . fenofibrate  54 mg Oral Daily  . furosemide  40 mg Intravenous Once  . furosemide  40 mg Oral Daily  . guaiFENesin  600 mg Oral BID  . insulin aspart  0-9 Units Subcutaneous 6 times per day  . metoprolol tartrate  12.5 mg Oral BID  . potassium chloride  10 mEq Intravenous Q1 Hr x 4  . sodium chloride  3 mL Intravenous Q12H   Continuous Infusions:  PRN Meds:.sodium chloride, acetaminophen, ondansetron (ZOFRAN) IV, sodium chloride  PE: General appearance: alert, cooperative and Very pleasant.  Laying flat in the bed. Neck: no JVD Lungs: Decreased BS bilaterally Heart: regular rate and rhythm and 1/6 sys MM Abdomen: +BS, nontender Extremities: No LEE Pulses: 2+ and symmetric Skin: warm and dry Neurologic: Alert.    Lab Results:   Recent Labs  04/20/15 1254 04/20/15 1302 04/22/15 0506 04/23/15 0726  WBC 6.9  --  7.9 5.4  HGB 11.6* 13.3 11.6* 11.5*  HCT 35.5* 39.0 35.4* 35.3*  PLT 164  --  158 153   BMET  Recent Labs  04/21/15 0619 04/22/15 0506 04/23/15 0726  NA 142 145 147*  K 5.2* 3.3* 3.5  CL 101 101 105  CO2 29 32 31  GLUCOSE 132* 102* 127*  BUN 14 18 31*  CREATININE 1.29* 1.31* 1.53*  CALCIUM 9.1 8.9  8.6*    Assessment/Plan 82 year old male patient with known history of mild aortic stenosis, CAD, s/p CABG, history of ischemic cardiomyopathy, s/p BiV ICD placement now with preserved LV function based on most recent echo this past admission, significant pulmonary hypertension and associated diastolic heart failure. Because of previous low EF an ICD had been implanted, he also has dyslipidemia and dementia, as well as diabetes. He was discharged on 1/16 after an admission for syncope and collapse in setting of bradycardia, hypoglycemia and hypotension presumably related to overdiuresis and dehydration in setting of significant aortic stenosis.     Acute diastolic heart failure, NYHA class 1 (HCC)   Myoclonic jerking   Dyslipidemia   Biventricular implantable cardioverter-defibrillator medtronic    History of ischemic cardiomyopathy   Dementia   Pulmonary hypertension (HCC)   Aortic stenosis   Pleural effusion   RHF (right heart failure) (HCC)   Diabetes mellitus, stable (HCC)   Acute diastolic heart failure (HCC)   Palliative care encounter   Dysphagia  1. Acute diastolic heart failure, NYHA class 3   Net fluids: -0.1/-0.4L.  SCr increased from 1.31 >>1.53.   BP 101/82-143/93  Appears euvolemic.  DC IV lasix.  Changed PO to tomorrow.   2. Ischemic cardiomyopathy, LVEF 50-55% - continue metoprolol - s/p BiV -ICD placement,  100% paced - the patient has advanced dementia, he states that he doesn't have a cardiologist, we will schedule a follow up in our clinic after the discharge  3. CAD, s/p CABG, elevated troponin - most probably demand ischemia sec to CHF - considering dementia, advanced age and acute kidney failure not a candidate for a cath - continue ASA and metoprolol  4. Biventricular implantable cardioverter-defibrillator medtronic - as above  5. Dyslipidemia - on fenofibrate, would avoid statins considering advanced age and dementia.   6.  Dementia  Palliative Med  following.   7.  Elevated troponin 0.79.  Given age and dementia, no further ischemic evaluation.     Wilburt Finlay PA-C 04/23/2015 9:48 AM   The patient was seen, examined and discussed with Wilburt Finlay, PA-C and I agree with the above.   82 year old male with h/o CAD, CABG, ischemic cardiomyopathy, s/p BiV ICD placement, admitted with acute on chronic CHF. He is ow euvolemic with worsening kidney failure, we will switch iv lasx to po lasix and arrange follow p in our clinic. He can be discharged from cardiology standpoint.  Lars Masson 04/23/2015

## 2015-04-23 NOTE — Progress Notes (Signed)
Daily Progress Note   Patient Name: Christopher Waller       Date: 04/23/2015 DOB: Aug 01, 1924  Age: 82 y.o. MRN#: [id number] Attending Physician: Richarda Overlie, MD Primary Care Physician: No PCP Per Patient Admit Date: 04/20/2015  Reason for Consultation/Follow-up: Establishing goals of care  Subjective: Mr. Paternoster is sleeping and appears to be resting comfortably so I did not disturb him. No issues/complaints per nursing. I did speak with nephew, [name], again today. Again explained the severity of dysphagia and lack of options at this point in his disease trajectory of dementia. Also strongly recommended DNR for him. Our conversation seems to circle around as [name] seems to agree with focus on QOL and no resuscitation for his uncle but then never agrees to DNR - encouraged him to speak with his wife and further consider this. We also discussed hospice and how this can be a help to them at home. [name] wishes to start diet for his uncle and see how he does with this over the next ~24 hrs. I will speak with them again tomorrow.    Length of Stay: 0 days  Current Medications: Scheduled Meds:  . aspirin  81 mg Oral Daily  . divalproex  250 mg Oral 3 times per day  . enoxaparin (LOVENOX) injection  40 mg Subcutaneous Q24H  . fenofibrate  54 mg Oral Daily  . furosemide  40 mg Oral Daily  . guaiFENesin  600 mg Oral BID  . insulin aspart  0-9 Units Subcutaneous 6 times per day  . metoprolol tartrate  12.5 mg Oral BID  . sodium chloride  3 mL Intravenous Q12H    Continuous Infusions:    PRN Meds: sodium chloride, acetaminophen, ondansetron (ZOFRAN) IV, sodium chloride  Physical Exam: Physical Exam  Constitutional: He appears well-developed. He appears lethargic.  HENT:  Head:  Normocephalic and atraumatic.  Cardiovascular: Normal rate.   Pulmonary/Chest: Effort normal. No accessory muscle usage. No tachypnea. No respiratory distress.  Abdominal: Soft. Normal appearance.  Neurological: He appears lethargic. He is disoriented.                Vital Signs: BP 135/68 mmHg  Pulse 80  Temp(Src) 98.2 F (36.8 C) (Oral)  Resp 18  Ht  (1.702 m)  Wt 59.5 kg (131 lb 2.8 oz)  BMI 20.54 kg/m2  SpO2 96% SpO2: SpO2: 96 % O2 Device: O2 Device: Not Delivered O2 Flow Rate: O2 Flow Rate (L/min): 2 L/min  Intake/output summary:  Intake/Output Summary (Last 24 hours) at 04/23/15 1450 Last data filed at 04/23/15 0500  Gross per 24 hour  Intake    600 ml  Output    500 ml  Net    100 ml   LBM: Last BM Date: 04/15/15 Baseline Weight: Weight: 67.3 kg (148 lb 5.9 oz) Most recent weight: Weight: 59.5 kg (131 lb 2.8 oz)       Palliative Assessment/Data:   Additional Data Reviewed: CBC    Component Value Date/Time   WBC 5.4 04/23/2015 0726   RBC 3.61* 04/23/2015 0726   HGB 11.5* 04/23/2015 0726   HCT 35.3* 04/23/2015 0726   PLT 153 04/23/2015 0726   MCV 97.8 04/23/2015 0726   MCH 31.9 04/23/2015 0726   MCHC 32.6 04/23/2015 0726   RDW 14.1 04/23/2015 0726   LYMPHSABS 1.1 04/20/2015 1254   MONOABS 0.7 04/20/2015 1254   EOSABS 0.1 04/20/2015 1254   BASOSABS 0.0 04/20/2015 1254    CMP     Component Value Date/Time   NA 147* 04/23/2015 0726   K 3.5 04/23/2015 0726   CL 105 04/23/2015 0726   CO2 31 04/23/2015 0726   GLUCOSE 127* 04/23/2015 0726   BUN 31* 04/23/2015 0726   CREATININE 1.53* 04/23/2015 0726   CALCIUM 8.6* 04/23/2015 0726   PROT 5.7* 04/23/2015 0726   ALBUMIN 2.4* 04/23/2015 0726   AST 46* 04/23/2015 0726   ALT 24 04/23/2015 0726   ALKPHOS 42 04/23/2015 0726   BILITOT 1.5* 04/23/2015 0726   GFRNONAA 39* 04/23/2015 0726   GFRAA 45* 04/23/2015 0726       Problem List:  Patient Active Problem List   Diagnosis Date Noted  .  Palliative care encounter   . Dysphagia   . Coronary artery disease due to lipid rich plaque   . Acute diastolic heart failure, NYHA class 1 (HCC) 04/20/2015  . Pleural effusion 04/20/2015  . RHF (right heart failure) (HCC) 04/20/2015  . Diabetes mellitus, stable (HCC) 04/20/2015  . Acute diastolic heart failure (HCC) 04/20/2015  . Pressure ulcer 04/15/2015  . Aortic stenosis 04/15/2015  . Pulmonary hypertension (HCC)   . Hypoglycemia associated with diabetes (HCC)   . Dementia   . Biventricular implantable cardioverter-defibrillator medtronic  01/05/2015  . History of ischemic cardiomyopathy 01/05/2015  . Dyslipidemia   . Myoclonic jerking 01/04/2015  . Hypnagogic jerks 01/04/2015     Palliative Care Assessment & Plan    1.Code Status:  Limited - no intubation - discussed the limited benefit of CPR/defib if no desire for intubation    Code Status Orders        Start     Ordered   04/20/15 2004  Limited resuscitation (code)   Continuous    Question Answer Comment  In the event of cardiac or respiratory ARREST: Initiate Code Blue, Call Rapid Response Yes   In the event of cardiac or respiratory ARREST: Perform CPR Yes   In the event of cardiac or respiratory ARREST: Perform Intubation/Mechanical Ventilation No   In the event of cardiac or respiratory ARREST: Use NIPPV/BiPAp only if indicated Yes   In the event of cardiac or respiratory ARREST: Administer ACLS medications if indicated Yes   In the event of cardiac or respiratory ARREST: Perform Defibrillation or Cardioversion if indicated Yes  04/20/15 2003    Code Status History    Date Active Date Inactive Code Status Order ID Comments User Context   04/11/2015  2:50 AM 04/15/2015  6:11 PM Partial Code [id number]  [name], MD Inpatient   01/06/2015  1:15 PM 01/08/2015  5:57 PM Partial Code [id number]  [name], MD Inpatient   01/04/2015  6:53 PM 01/06/2015  1:15 PM Full Code [id number]  [name], MD Inpatient       2. Goals of Care/Additional Recommendations:  Continue aggressive care. Only limitation placed is that Mr. Muellner would NOT want artificial feeding.   Limitations on Scope of Treatment: Initiate Comfort Feeding and No Artificial Feeding  Desire for further Chaplaincy support:no  Psycho-social Needs: Caregiving  Support/Resources and Education on Hospice  3. Symptom Management:      1. Dysphagia: Initiate comfort feeds.   4. Palliative Prophylaxis:   Aspiration  5. Prognosis: < 6 months - likely much less with severe dysphagia and comfort feeds - high risk for aspiration.   6. Discharge Planning:  To be determined. Family considering SNF placement.    Care plan was discussed with Dr. Susie Cassette.   Thank you for allowing the Palliative Medicine Team to assist in the care of this patient.   Time In: 1210 Time Out: 1230 Total Time Prolonged Time Billed  no         Ulice Bold, NP  04/23/2015, 2:50 PM  Please contact Palliative Medicine Team phone at [phone] for questions and concerns.

## 2015-04-23 NOTE — Progress Notes (Addendum)
[facility] PROGRESS NOTE  Ahaan Zobrist ZOX:[id number] DOB: 15-Aug-1924 DOA: 04/20/2015 PCP: No PCP Per Patient  Length of stay:    Assessment/Plan: Principal Problem:   Acute diastolic heart failure, NYHA class 1 (HCC) Active Problems:   Myoclonic jerking   Dyslipidemia   Biventricular implantable cardioverter-defibrillator medtronic    History of ischemic cardiomyopathy   Dementia   Pulmonary hypertension (HCC)   Aortic stenosis   Pleural effusion   RHF (right heart failure) (HCC)   Diabetes mellitus, stable (HCC)   Acute diastolic heart failure (HCC)   Palliative care encounter   Dysphagia    Brief summary 82 y.o. male PMHx DM Type 2, Chronic Systolic CHF, Dilated Cardiomyopathy, Pulmonary HTN, , CAD S/P CABG, S/P Biventricular implantable cardioverter-defibrillator medtronic,CKD , now with preserved LV function ( measured EF 35-40%- now found to be improved to 50-55%) , significant pulmonary hypertension and associated diastolic heart failure. Because of previous low EF an ICD had been implanted, he also has dyslipidemia and dementia, as well as diabetes. He was discharged on 116 after an admission for syncope and collapse in setting of bradycardia, hypoglycemia and hypotension presumably related to overdiuresis and dehydration in setting of significant aortic stenosis. At discharge his Januvia and glipizide were discontinued as well as his Lopressor, lisinopril and Lasix. Recommendations were to follow the patient closely to determine when to resume medications primarily diuretic. Patient was brought back to the ER today and  Presents with shortness of breath and gurgling in his throat. He was trying to utilize Mucinex to see this would help. He reported productive cough of clear sputum. He denies any fevers chills or abdominal symptoms. During the last admission patient was found to have myoclonic jerking versus tremors and was started on low-dose Depakote. CT neck  showed bilateral carotid bifurcation calcification with stenosis of the proximal right and left internal carotid artery, moderate left-sided pleural effusion   Assessment and plan Acute diastolic heart failure, NYHA class 1 plus RHF /pulmonary hypertension/aortic stenosis  x-ray evidence of edema as well as pleural effusions, flu  Negative , abnormal troponin, peak  0.79, trending down -Removed topical nitrate in setting of right heart failure and aortic stenosis Continue Lasix 40 mg PO daily , not sure if the patient will be able to continue this at home given swallowing issues, lasix 20 mg at discharge History of recent hypotensive syncope 2/2 anti HTN meds and diuretic Cont  low dose metoprolol 12.5 mg twice a day monitoring for recurrent bradycardia Continue to hold lisinopril in the setting of diuresis and increasing creatinine  2-D echo 04/12/15, EF 50-55% with moderate aortic stenosis PT OT evaluation recommend SNF     Dysphagia Noted to have Severe pharyngeal phase dysphagia with decline  Speech therapy recommends NPO pending palliative care recommendations -This may explain patient's current complaints regarding his throat congestion Nephew would like to start comfort diet , despite the risk of aspiration  Acute on CK D stage III, creatinine worsening with lasix , reduce to 20 mg at DC     History of ischemic cardiomyopathy,LVEF 50-55% Continue metoprolol, s/p BiV -ICD placement, 100% paced -Also with moderate TR, moderate to severe pulmonary hypertension and moderate dilatation of right ventricle with reduced systolic function   Diabetes mellitus-stable  -oral medications discontinued last admission after hypoglycemic episode  -CBG slightly elevated at 173 -Follow CBGs and provide SSI   Pleural effusion -Secondary to diastolic heart failure with right heart failure -Careful  diuresis as above   Myoclonic jerking -Continue Depakote, and psychiatry consultation  during last admission 1/15 for confusion, deemed not to have capacity to make any decisions for himself   Hypokalemia-repleted    Biventricular implantable cardioverter-defibrillator medtronic followed by Dr. Graciela Husbands   Dyslipidemia -stable    Dementia-palliative care consultation for goals of care, recurrent admissions for weakness, dementia, confusion,dysphagia    DVT prophylaxsis  lovenox   Code Status:      Code Status Orders    Limited code no intubation    Start     Ordered     Family Communication: Discussed in detail with the nephew , refuses to go to SNF, ok to start comfort diet , all imaging results, lab results explained to the patient   Disposition Plan: 2-3 days , may go to SNF pending family decision, nephew has not decided yet     Consultants:  None  Procedures:  None  Antibiotics: Anti-infectives    None         HPI/Subjective:  confused , sitting on the side of the bed ,more awake  today than yesterday,     Objective: Filed Vitals:   04/22/15 1133 04/22/15 2027 04/22/15 2152 04/23/15 0504  BP: 107/61 101/82 116/53 143/93  Pulse: 83 104 79 83  Temp: 98.2 F (36.8 C) 98 F (36.7 C) 98.4 F (36.9 C) 97.9 F (36.6 C)  TempSrc: Oral Oral Oral Axillary  Resp: 18 18 18 21   Height:      Weight:    59.5 kg (131 lb 2.8 oz)  SpO2: 100% 100% 95% 93%    Intake/Output Summary (Last 24 hours) at 04/23/15 0932 Last data filed at 04/23/15 0500  Gross per 24 hour  Intake    600 ml  Output    700 ml  Net   -100 ml    Exam:  General: Pleasant, AAO x 1 Psych: Normal affect. Neuro: Alert and oriented X 3. Moves all extremities spontaneously. HEENT: Normal Neck: Supple without bruits or JVD. Lungs: Resp regular and unlabored, crackles at bases Heart: RRR no s3, s4, or murmurs. Abdomen: Soft, non-tender, non-distended, BS + x 4.  Extremities: No clubbing, cyanosis or edema. DP/PT/Radials 2+ and equal bilaterally Data Review    Micro Results Recent Results (from the past 240 hour(s))  C difficile quick scan w PCR reflex     Status: None   Collection Time: 04/22/15  5:56 PM  Result Value Ref Range Status   C Diff antigen NEGATIVE NEGATIVE Final   C Diff toxin NEGATIVE NEGATIVE Final   C Diff interpretation Negative for toxigenic C. difficile  Final    Radiology Reports Dg Chest 2 View  04/21/2015  CLINICAL DATA:  Acute diastolic heart failure EXAM: CHEST  2 VIEW COMPARISON:  04/20/2015 FINDINGS: Cardiomegaly again noted. Three leads cardiac pacemaker is unchanged in position. Persistent mild interstitial prominence bilateral probable mild interstitial edema. Again noted small bilateral pleural effusion with bilateral basilar atelectasis. Atherosclerotic calcifications of thoracic aorta. Osteopenia and degenerative changes thoracolumbar spine. Stable compression deformity upper lumbar spine. IMPRESSION: Three leads cardiac pacemaker is unchanged in position. Persistent mild interstitial prominence bilateral probable mild interstitial edema. Again noted small bilateral pleural effusion with bilateral basilar atelectasis. Electronically Signed   By: Natasha Mead M.D.   On: 04/21/2015 09:22   Dg Chest 2 View  04/20/2015  CLINICAL DATA:  82 year old male with wheezing, shortness of breath chest EXAM: CHEST  2 VIEW COMPARISON:  Prior chest  x-ray 04/13/2015 FINDINGS: Stable position of left subclavian approach biventricular cardiac rhythm maintenance device. The leads project over the right atrium, right ventricle and overlying the left ventricle. Cardiac and mediastinal contours are unchanged. Atherosclerotic calcifications present in the transverse aorta. Patient is status post median sternotomy. Slightly increased pulmonary vascular congestion without overt edema. No pneumothorax. Small bilateral pleural effusions. Compression deformity of L1. No acute osseous abnormality. IMPRESSION: 1. Slightly increased pulmonary vascular  congestion bordering on mild edema. 2. Small bilateral pleural effusions. 3. Stable cardiomegaly. 4. Age indeterminate L1 compression fracture.  Favor remote. Electronically Signed   By: Malachy Moan M.D.   On: 04/20/2015 13:48   Ct Soft Tissue Neck W Contrast  04/20/2015  CLINICAL DATA:  82 year old diabetic male with dementia coughing or choking to the large secretions. Initial encounter. EXAM: CT NECK WITH CONTRAST TECHNIQUE: Multidetector CT imaging of the neck was performed using the standard protocol following the bolus administration of intravenous contrast. CONTRAST:  75mL OMNIPAQUE IOHEXOL 300 MG/ML  SOLN COMPARISON:  None. FINDINGS: Pharynx and larynx: Negative. Salivary glands: Negative. Thyroid: Negative. Lymph nodes: No adenopathy. Vascular: Significant atherosclerotic changes including bilateral carotid bifurcation calcification with 62% diameter stenosis proximal right internal carotid artery and 64% diameter stenosis proximal left internal carotid artery. Moderate to marked narrowing right vertebral artery and mild to moderate narrowing left vertebral artery at the level of foramen magnum. Limited intracranial: Atrophy and small vessel disease changes. Visualized orbits: Limited evaluation and unremarkable. Mastoids and visualized paranasal sinuses: Minimal polypoid opacification inferior right maxillary sinus. Skeleton: Degenerative changes cervical spine most notable C3-4 and C5-6 with fusion C4-5. Remote anterior wedge compression deformity T2 with 50% loss height anteriorly. Upper chest: Small to slightly moderate-size left-sided and small right-sided pleural effusion with adjacent mild atelectasis. Pacemaker in place. IMPRESSION: Exam limited by motion and dental artifact without pharyngeal mass noted. Bilateral carotid bifurcation calcification with 62% diameter stenosis proximal right internal carotid artery and 64% diameter stenosis proximal left internal carotid artery. Moderate to  marked narrowing right vertebral artery and mild to moderate narrowing left vertebral artery at the level of foramen magnum. Degenerative changes cervical spine most notable C3-4 and C5-6 with fusion C4-5. Remote anterior wedge compression deformity T2 with 50% loss height anteriorly. Small to slightly moderate-size left-sided and small right-sided pleural effusion with adjacent mild atelectasis. Electronically Signed   By: Lacy Duverney M.D.   On: 04/20/2015 15:20   Dg Chest Port 1 View  04/13/2015  CLINICAL DATA:  Pneumonia.  Short of breath. EXAM: PORTABLE CHEST 1 VIEW COMPARISON:  04/11/2015 FINDINGS: Heart remains mildly enlarged. Left subclavian AICD device is stable. Sternal wires are stable. Lungs under aerated and grossly clear. No pneumothorax. IMPRESSION: No active disease. Electronically Signed   By: Jolaine Click M.D.   On: 04/13/2015 10:21   Dg Chest Port 1 View  04/11/2015  CLINICAL DATA:  81 year old male with hypotension EXAM: PORTABLE CHEST 1 VIEW COMPARISON:  None. FINDINGS: Single-view of the chest does not demonstrate a focal consolidation. There is no pleural effusion or pneumothorax. Mild cardiomegaly. Left pectoral AICD device. Median sternotomy wires and CABG vascular clips. The osseous structures are grossly unremarkable with IMPRESSION: No acute cardiopulmonary process. Electronically Signed   By: Elgie Collard M.D.   On: 04/11/2015 01:36   Dg Swallowing Func-speech Pathology  04/22/2015  Objective Swallowing Evaluation:   MBS Patient Details Name: Taniela Feltus MRN: [id number] Date of Birth: 14-Mar-1925 Today's Date: 04/22/2015 Time: SLP Start Time (ACUTE ONLY): 0818-SLP  Stop Time (ACUTE ONLY): 0837 SLP Time Calculation (min) (ACUTE ONLY): 19 min Past Medical History: Past Medical History Diagnosis Date . Coronary artery disease  . Dementia  . Diabetes mellitus without complication (HCC)  . Hyperlipidemia  . Ischemic cardiomyopathy 01/05/2015 . Biventricular implantable  cardioverter-defibrillator medtronic  01/05/2015 Past Surgical History: Past Surgical History Procedure Laterality Date . Pacemaker insertion   . Coronary artery bypass graft   HPI: Pt is an 82 y.o. male with PMH of aortic stenosis, history of ischemic cardiomyopathy, significant pulmonary hypertension and associated diastolic heart failure, dyslipidemia and dementia and diabetes. Per chart, pt was discharged on 1/16 after an admission for syncope and collapse in setting of bradycardia, hypoglycemia and hypotension. Admitted 1/21 for shortness of breath and gurgling in throat. CXR 1/22 showed bilateral pleural effusions with bilateral basilar atelectasis. Pt had MBS 01/06/15 which showed mild oral, moderate-severe pharyngeal dysphagia with suspected esophageal component- premature spill with large amount of pharyngeal penetration and poor sensation, also poor laryngeal elevation, tongue base retraction, and pharyngeal contraction. Dysphagia 3/ thin liquids were recommended. MBS recommended given history. No Data Recorded Assessment / Plan / Recommendation CHL IP CLINICAL IMPRESSIONS 04/22/2015 Therapy Diagnosis Severe pharyngeal phase dysphagia;Moderate pharyngeal phase dysphagia;Mild oral phase dysphagia Clinical Impression Pt's swallow function has deteriorated from MBS in 12/2014. Tongue base retraction, laryngeal elevation significantly decreased resulting in mod-max vallecular and pyriform sinus residue. Therapeutic strategies were unsuccessful to decrease pharyngeal residue to a safe amount and pt silently penetrated and aspirated in between and during swallows. Thin and nectar thick barium yielded similar volumes of pharyngeal residue. Brief esophageal scan did not reveal overt abnormalities (MBS does not diagnose below level of UES). Pt has chronic dysphagia and currently is at high risk with all consistencies. Results discussed with Dr. Susie Cassette who stated she will discuss wishes for nutrition with family. Will  follow up for any further education.       Impact on safety and function Severe aspiration risk   CHL IP TREATMENT RECOMMENDATION 01/06/2015 Treatment Recommendations Therapy as outlined in treatment plan below   Prognosis 01/06/2015 Prognosis for Safe Diet Advancement Good Barriers to Reach Goals Cognitive deficits;Time post onset Barriers/Prognosis Comment -- CHL IP DIET RECOMMENDATION 04/22/2015 SLP Diet Recommendations NPO Liquid Administration via -- Medication Administration -- Compensations -- Postural Changes --   CHL IP OTHER RECOMMENDATIONS 04/22/2015 Recommended Consults -- Oral Care Recommendations Oral care QID Other Recommendations --   CHL IP FOLLOW UP RECOMMENDATIONS 04/22/2015 Follow up Recommendations None   CHL IP FREQUENCY AND DURATION 01/06/2015 Speech Therapy Frequency (ACUTE ONLY) min 2x/week Treatment Duration 2 weeks      CHL IP ORAL PHASE 04/22/2015 Oral Phase Impaired Oral - Pudding Teaspoon -- Oral - Pudding Cup -- Oral - Honey Teaspoon -- Oral - Honey Cup -- Oral - Nectar Teaspoon -- Oral - Nectar Cup -- Oral - Nectar Straw -- Oral - Thin Teaspoon -- Oral - Thin Cup -- Oral - Thin Straw -- Oral - Puree -- Oral - Mech Soft -- Oral - Regular Delayed oral transit;Weak lingual manipulation Oral - Multi-Consistency -- Oral - Pill -- Oral Phase - Comment --  CHL IP PHARYNGEAL PHASE 04/22/2015 Pharyngeal Phase Impaired Pharyngeal- Pudding Teaspoon -- Pharyngeal -- Pharyngeal- Pudding Cup -- Pharyngeal -- Pharyngeal- Honey Teaspoon -- Pharyngeal -- Pharyngeal- Honey Cup -- Pharyngeal -- Pharyngeal- Nectar Teaspoon -- Pharyngeal -- Pharyngeal- Nectar Cup Pharyngeal residue - valleculae;Pharyngeal residue - pyriform;Reduced tongue base retraction;Reduced laryngeal elevation;Penetration/Aspiration during swallow Pharyngeal Material enters airway,  CONTACTS cords and not ejected out Pharyngeal- Nectar Straw -- Pharyngeal -- Pharyngeal- Thin Teaspoon Delayed swallow initiation-pyriform sinuses;Pharyngeal  residue - valleculae;Pharyngeal residue - pyriform;Reduced tongue base retraction;Reduced laryngeal elevation Pharyngeal -- Pharyngeal- Thin Cup Delayed swallow initiation-pyriform sinuses;Pharyngeal residue - valleculae;Pharyngeal residue - pyriform;Reduced tongue base retraction;Reduced laryngeal elevation Pharyngeal -- Pharyngeal- Thin Straw -- Pharyngeal -- Pharyngeal- Puree Penetration/Aspiration during swallow;Pharyngeal residue - valleculae;Pharyngeal residue - pyriform;Reduced laryngeal elevation;Reduced tongue base retraction Pharyngeal Material enters airway, passes BELOW cords then ejected out;Material enters airway, remains ABOVE vocal cords and not ejected out Pharyngeal- Mechanical Soft -- Pharyngeal -- Pharyngeal- Regular Pharyngeal residue - valleculae;Pharyngeal residue - pyriform;Reduced tongue base retraction;Reduced laryngeal elevation Pharyngeal -- Pharyngeal- Multi-consistency -- Pharyngeal -- Pharyngeal- Pill -- Pharyngeal -- Pharyngeal Comment --  CHL IP CERVICAL ESOPHAGEAL PHASE 04/22/2015 Cervical Esophageal Phase WFL Pudding Teaspoon -- Pudding Cup -- Honey Teaspoon -- Honey Cup -- Nectar Teaspoon -- Nectar Cup -- Nectar Straw -- Thin Teaspoon -- Thin Cup -- Thin Straw -- Puree -- Mechanical Soft -- Regular -- Multi-consistency -- Pill -- Cervical Esophageal Comment -- CHL IP GO 04/22/2015 Functional Assessment Tool Used skilled clinical judgement Functional Limitations Swallowing Swallow Current Status (Z6109) CL Swallow Goal Status (U0454) CK Swallow Discharge Status (U9811) (None) Motor Speech Current Status (B1478) (None) Motor Speech Goal Status (G9562) (None) Motor Speech Goal Status (Z3086) (None) Spoken Language Comprehension Current Status (V7846) (None) Spoken Language Comprehension Goal Status (N6295) (None) Spoken Language Comprehension Discharge Status (M8413) (None) Spoken Language Expression Current Status (K4401) (None) Spoken Language Expression Goal Status (U2725) (None)  Spoken Language Expression Discharge Status [phone]) (None) Attention Current Status (I3474) (None) Attention Goal Status (Q5956) (None) Attention Discharge Status (L8756) (None) Memory Current Status (E3329) (None) Memory Goal Status (J1884) (None) Memory Discharge Status (Z6606) (None) Voice Current Status (T0160) (None) Voice Goal Status (F0932) (None) Voice Discharge Status (T5573) (None) Other Speech-Language Pathology Functional Limitation [phone]) (None) Other Speech-Language Pathology Functional Limitation Goal Status (K2706) (None) Other Speech-Language Pathology Functional Limitation Discharge Status [phone]) (None) Royce Macadamia 04/22/2015, 10:32 AM Breck Coons Lonell Face.Ed CCC-SLP Pager [phone]                CBC  Recent Labs Lab 04/20/15 1254 04/20/15 1302 04/22/15 0506 04/23/15 0726  WBC 6.9  --  7.9 5.4  HGB 11.6* 13.3 11.6* 11.5*  HCT 35.5* 39.0 35.4* 35.3*  PLT 164  --  158 153  MCV 97.5  --  96.5 97.8  MCH 31.9  --  31.6 31.9  MCHC 32.7  --  32.8 32.6  RDW 14.2  --  14.2 14.1  LYMPHSABS 1.1  --   --   --   MONOABS 0.7  --   --   --   EOSABS 0.1  --   --   --   BASOSABS 0.0  --   --   --     Chemistries   Recent Labs Lab 04/20/15 1302 04/21/15 0619 04/22/15 0506 04/23/15 0726  NA 145 142 145 147*  K 4.3 5.2* 3.3* 3.5  CL 104 101 101 105  CO2  --  29 32 31  GLUCOSE 173* 132* 102* 127*  BUN 31*  CREATININE 0.90 1.29* 1.31* 1.53*  CALCIUM  --  9.1 8.9 8.6*  AST  --   --  43* 46*  ALT  --   --  24 24  ALKPHOS  --   --  43 42  BILITOT  --   --  1.0 1.5*   ------------------------------------------------------------------------------------------------------------------ estimated creatinine clearance is 28.1 mL/min (by C-G formula based on Cr of 1.53). ------------------------------------------------------------------------------------------------------------------ No results for input(s): HGBA1C in the last 72  hours. ------------------------------------------------------------------------------------------------------------------ No results for input(s): CHOL, HDL, LDLCALC, TRIG, CHOLHDL, LDLDIRECT in the last 72 hours. ------------------------------------------------------------------------------------------------------------------ No results for input(s): TSH, T4TOTAL, T3FREE, THYROIDAB in the last 72 hours.  Invalid input(s): FREET3 ------------------------------------------------------------------------------------------------------------------ No results for input(s): VITAMINB12, FOLATE, FERRITIN, TIBC, IRON, RETICCTPCT in the last 72 hours.  Coagulation profile No results for input(s): INR, PROTIME in the last 168 hours.  No results for input(s): DDIMER in the last 72 hours.  Cardiac Enzymes  Recent Labs Lab 04/21/15 0037 04/21/15 0619 04/21/15 1120  TROPONINI 0.79* 0.63* 0.45*   ------------------------------------------------------------------------------------------------------------------ Invalid input(s): POCBNP   CBG:  Recent Labs Lab 04/22/15 1623 04/22/15 2002 04/23/15 0106 04/23/15 0459 04/23/15 0809  GLUCAP 138* 129* 121* 118* 107*       Studies: Dg Swallowing Func-speech Pathology  04/22/2015  Objective Swallowing Evaluation:   MBS Patient Details Name: Esther Broyles MRN: [id number] Date of Birth: 1925-03-21 Today's Date: 04/22/2015 Time: SLP Start Time (ACUTE ONLY): 0818-SLP Stop Time (ACUTE ONLY): 0837 SLP Time Calculation (min) (ACUTE ONLY): 19 min Past Medical History: Past Medical History Diagnosis Date . Coronary artery disease  . Dementia  . Diabetes mellitus without complication (HCC)  . Hyperlipidemia  . Ischemic cardiomyopathy 01/05/2015 . Biventricular implantable cardioverter-defibrillator medtronic  01/05/2015 Past Surgical History: Past Surgical History Procedure Laterality Date . Pacemaker insertion   . Coronary artery bypass graft   HPI: Pt is an  82 y.o. male with PMH of aortic stenosis, history of ischemic cardiomyopathy, significant pulmonary hypertension and associated diastolic heart failure, dyslipidemia and dementia and diabetes. Per chart, pt was discharged on 1/16 after an admission for syncope and collapse in setting of bradycardia, hypoglycemia and hypotension. Admitted 1/21 for shortness of breath and gurgling in throat. CXR 1/22 showed bilateral pleural effusions with bilateral basilar atelectasis. Pt had MBS 01/06/15 which showed mild oral, moderate-severe pharyngeal dysphagia with suspected esophageal component- premature spill with large amount of pharyngeal penetration and poor sensation, also poor laryngeal elevation, tongue base retraction, and pharyngeal contraction. Dysphagia 3/ thin liquids were recommended. MBS recommended given history. No Data Recorded Assessment / Plan / Recommendation CHL IP CLINICAL IMPRESSIONS 04/22/2015 Therapy Diagnosis Severe pharyngeal phase dysphagia;Moderate pharyngeal phase dysphagia;Mild oral phase dysphagia Clinical Impression Pt's swallow function has deteriorated from MBS in 12/2014. Tongue base retraction, laryngeal elevation significantly decreased resulting in mod-max vallecular and pyriform sinus residue. Therapeutic strategies were unsuccessful to decrease pharyngeal residue to a safe amount and pt silently penetrated and aspirated in between and during swallows. Thin and nectar thick barium yielded similar volumes of pharyngeal residue. Brief esophageal scan did not reveal overt abnormalities (MBS does not diagnose below level of UES). Pt has chronic dysphagia and currently is at high risk with all consistencies. Results discussed with Dr. Susie Cassette who stated she will discuss wishes for nutrition with family. Will follow up for any further education.       Impact on safety and function Severe aspiration risk   CHL IP TREATMENT RECOMMENDATION 01/06/2015 Treatment Recommendations Therapy as outlined in  treatment plan below   Prognosis 01/06/2015 Prognosis for Safe Diet Advancement Good Barriers to Reach Goals Cognitive deficits;Time post onset Barriers/Prognosis Comment -- CHL IP DIET RECOMMENDATION 04/22/2015 SLP Diet Recommendations NPO Liquid Administration via -- Medication Administration -- Compensations -- Postural Changes --   CHL IP OTHER RECOMMENDATIONS 04/22/2015 Recommended  Consults -- Oral Care Recommendations Oral care QID Other Recommendations --   CHL IP FOLLOW UP RECOMMENDATIONS 04/22/2015 Follow up Recommendations None   CHL IP FREQUENCY AND DURATION 01/06/2015 Speech Therapy Frequency (ACUTE ONLY) min 2x/week Treatment Duration 2 weeks      CHL IP ORAL PHASE 04/22/2015 Oral Phase Impaired Oral - Pudding Teaspoon -- Oral - Pudding Cup -- Oral - Honey Teaspoon -- Oral - Honey Cup -- Oral - Nectar Teaspoon -- Oral - Nectar Cup -- Oral - Nectar Straw -- Oral - Thin Teaspoon -- Oral - Thin Cup -- Oral - Thin Straw -- Oral - Puree -- Oral - Mech Soft -- Oral - Regular Delayed oral transit;Weak lingual manipulation Oral - Multi-Consistency -- Oral - Pill -- Oral Phase - Comment --  CHL IP PHARYNGEAL PHASE 04/22/2015 Pharyngeal Phase Impaired Pharyngeal- Pudding Teaspoon -- Pharyngeal -- Pharyngeal- Pudding Cup -- Pharyngeal -- Pharyngeal- Honey Teaspoon -- Pharyngeal -- Pharyngeal- Honey Cup -- Pharyngeal -- Pharyngeal- Nectar Teaspoon -- Pharyngeal -- Pharyngeal- Nectar Cup Pharyngeal residue - valleculae;Pharyngeal residue - pyriform;Reduced tongue base retraction;Reduced laryngeal elevation;Penetration/Aspiration during swallow Pharyngeal Material enters airway, CONTACTS cords and not ejected out Pharyngeal- Nectar Straw -- Pharyngeal -- Pharyngeal- Thin Teaspoon Delayed swallow initiation-pyriform sinuses;Pharyngeal residue - valleculae;Pharyngeal residue - pyriform;Reduced tongue base retraction;Reduced laryngeal elevation Pharyngeal -- Pharyngeal- Thin Cup Delayed swallow initiation-pyriform  sinuses;Pharyngeal residue - valleculae;Pharyngeal residue - pyriform;Reduced tongue base retraction;Reduced laryngeal elevation Pharyngeal -- Pharyngeal- Thin Straw -- Pharyngeal -- Pharyngeal- Puree Penetration/Aspiration during swallow;Pharyngeal residue - valleculae;Pharyngeal residue - pyriform;Reduced laryngeal elevation;Reduced tongue base retraction Pharyngeal Material enters airway, passes BELOW cords then ejected out;Material enters airway, remains ABOVE vocal cords and not ejected out Pharyngeal- Mechanical Soft -- Pharyngeal -- Pharyngeal- Regular Pharyngeal residue - valleculae;Pharyngeal residue - pyriform;Reduced tongue base retraction;Reduced laryngeal elevation Pharyngeal -- Pharyngeal- Multi-consistency -- Pharyngeal -- Pharyngeal- Pill -- Pharyngeal -- Pharyngeal Comment --  CHL IP CERVICAL ESOPHAGEAL PHASE 04/22/2015 Cervical Esophageal Phase WFL Pudding Teaspoon -- Pudding Cup -- Honey Teaspoon -- Honey Cup -- Nectar Teaspoon -- Nectar Cup -- Nectar Straw -- Thin Teaspoon -- Thin Cup -- Thin Straw -- Puree -- Mechanical Soft -- Regular -- Multi-consistency -- Pill -- Cervical Esophageal Comment -- CHL IP GO 04/22/2015 Functional Assessment Tool Used skilled clinical judgement Functional Limitations Swallowing Swallow Current Status (O1308) CL Swallow Goal Status (M5784) CK Swallow Discharge Status (O9629) (None) Motor Speech Current Status (B2841) (None) Motor Speech Goal Status (L2440) (None) Motor Speech Goal Status (N0272) (None) Spoken Language Comprehension Current Status (Z3664) (None) Spoken Language Comprehension Goal Status (Q0347) (None) Spoken Language Comprehension Discharge Status (Q2595) (None) Spoken Language Expression Current Status (G3875) (None) Spoken Language Expression Goal Status (I4332) (None) Spoken Language Expression Discharge Status [phone]) (None) Attention Current Status (C1660) (None) Attention Goal Status (Y3016) (None) Attention Discharge Status (W1093) (None)  Memory Current Status (A3557) (None) Memory Goal Status (D2202) (None) Memory Discharge Status (R4270) (None) Voice Current Status (W2376) (None) Voice Goal Status (E8315) (None) Voice Discharge Status (V7616) (None) Other Speech-Language Pathology Functional Limitation [phone]) (None) Other Speech-Language Pathology Functional Limitation Goal Status (G6269) (None) Other Speech-Language Pathology Functional Limitation Discharge Status [phone]) (None) Royce Macadamia 04/22/2015, 10:32 AM Breck Coons Lonell Face.Ed CCC-SLP Pager [phone]                 Lab Results  Component Value Date   HGBA1C 8.2* 01/04/2015   Lab Results  Component Value Date   CREATININE 1.53* 04/23/2015  Scheduled Meds: . aspirin  81 mg Oral Daily  . divalproex  250 mg Oral 3 times per day  . enoxaparin (LOVENOX) injection  40 mg Subcutaneous Q24H  . fenofibrate  54 mg Oral Daily  . furosemide  40 mg Oral Daily  . guaiFENesin  600 mg Oral BID  . insulin aspart  0-9 Units Subcutaneous 6 times per day  . metoprolol tartrate  12.5 mg Oral BID  . sodium chloride  3 mL Intravenous Q12H   Continuous Infusions:   Principal Problem:   Acute diastolic heart failure, NYHA class 1 (HCC) Active Problems:   Myoclonic jerking   Dyslipidemia   Biventricular implantable cardioverter-defibrillator medtronic    History of ischemic cardiomyopathy   Dementia   Pulmonary hypertension (HCC)   Aortic stenosis   Pleural effusion   RHF (right heart failure) (HCC)   Diabetes mellitus, stable (HCC)   Acute diastolic heart failure (HCC)   Palliative care encounter   Dysphagia    Time spent: 45 minutes   [facility]  [facility] Pager [phone]. If 7PM-7AM, please contact night-coverage at [email or web address], password [facility] 04/23/2015, 9:32 AM

## 2015-04-24 LAB — GLUCOSE, CAPILLARY
GLUCOSE-CAPILLARY: 112 mg/dL — AB (ref 65–99)
Glucose-Capillary: 114 mg/dL — ABNORMAL HIGH (ref 65–99)
Glucose-Capillary: 118 mg/dL — ABNORMAL HIGH (ref 65–99)
Glucose-Capillary: 192 mg/dL — ABNORMAL HIGH (ref 65–99)
Glucose-Capillary: 225 mg/dL — ABNORMAL HIGH (ref 65–99)

## 2015-04-24 LAB — PROCALCITONIN

## 2015-04-24 LAB — COMPREHENSIVE METABOLIC PANEL
ALK PHOS: 44 U/L (ref 38–126)
ALT: 29 U/L (ref 17–63)
AST: 67 U/L — AB (ref 15–41)
Albumin: 2.6 g/dL — ABNORMAL LOW (ref 3.5–5.0)
Anion gap: 20 — ABNORMAL HIGH (ref 5–15)
BILIRUBIN TOTAL: 1.5 mg/dL — AB (ref 0.3–1.2)
BUN: 32 mg/dL — AB (ref 6–20)
CALCIUM: 8.6 mg/dL — AB (ref 8.9–10.3)
CHLORIDE: 96 mmol/L — AB (ref 101–111)
CO2: 32 mmol/L (ref 22–32)
CREATININE: 1.51 mg/dL — AB (ref 0.61–1.24)
GFR calc Af Amer: 46 mL/min — ABNORMAL LOW (ref >60)
GFR, EST NON AFRICAN AMERICAN: 39 mL/min — AB (ref >60)
Glucose, Bld: 124 mg/dL — ABNORMAL HIGH (ref 65–99)
Potassium: 3.9 mmol/L (ref 3.5–5.1)
Sodium: 148 mmol/L — ABNORMAL HIGH (ref 135–145)
Total Protein: 6 g/dL — ABNORMAL LOW (ref 6.5–8.1)

## 2015-04-24 LAB — CBC
HCT: 38.3 % — ABNORMAL LOW (ref 39.0–52.0)
Hemoglobin: 12.2 g/dL — ABNORMAL LOW (ref 13.0–17.0)
MCH: 31.2 pg (ref 26.0–34.0)
MCHC: 31.9 g/dL (ref 30.0–36.0)
MCV: 98 fL (ref 78.0–100.0)
PLATELETS: 164 10*3/uL (ref 150–400)
RBC: 3.91 MIL/uL — ABNORMAL LOW (ref 4.22–5.81)
RDW: 14 % (ref 11.5–15.5)
WBC: 5.1 10*3/uL (ref 4.0–10.5)

## 2015-04-24 MED ORDER — ENOXAPARIN SODIUM 30 MG/0.3ML ~~LOC~~ SOLN
30.0000 mg | SUBCUTANEOUS | Status: DC
  Administered 2015-04-24: 30 mg via SUBCUTANEOUS
  Filled 2015-04-24: qty 0.3

## 2015-04-24 NOTE — Progress Notes (Signed)
Speech Language Pathology Treatment: Dysphagia  Patient Details Name: Christopher Waller MRN: [id number] DOB: [birth year] Today's Date: 04/24/2015 Time: 4098-1191 SLP Time Calculation (min) (ACUTE ONLY): 15 min  Assessment / Plan / Recommendation Clinical Impression  Pt resting in bed. Pt easily awakened to voice, and was pleasant and cooperative. No family present to provide education, however, RN was consulted. MBS completed 04/22/15 indicated deterioration in swallow function and safety since MBS completed in October 2016, with recommendation for NPO status due to penetration and aspiration and significant post-swallow residue across consistencies. SLP left written information in pt room regarding continuing po intake with known risks of aspiration, as family wishes are for pt to continue to eat. Recommendations indicated for minimizing aspiration risk given, and encouragement to keep in mind that if the benefit of providing po intake outweighs the risk, then proceed with caution, one bite/sip at a time. ST to follow up after decisions have been made, and/or for education of family regarding aspiration risk.   HPI HPI: Pt is an 82 y.o. male with PMH of aortic stenosis, history of ischemic cardiomyopathy, significant pulmonary hypertension and associated diastolic heart failure, dyslipidemia and dementia and diabetes. Per chart, pt was discharged on 1/16 after an admission for syncope and collapse in setting of bradycardia, hypoglycemia and hypotension. Admitted 1/21 for shortness of breath and gurgling in throat. CXR 1/22 showed bilateral pleural effusions with bilateral basilar atelectasis. Pt had MBS 01/06/15 which showed mild oral, moderate-severe pharyngeal dysphagia with suspected esophageal component- premature spill with large amount of pharyngeal penetration and poor sensation, also poor laryngeal elevation, tongue base retraction, and pharyngeal contraction. MBS completed 04/22/15, with documented  deterioration in swallow function and safety since 12/2014, and recommendation for NPO status due to high aspiration risk.          Recommendations  Diet recommendations:  (family wishes po with known risks. Puree/thin likely to be least problematic)            Follow up Recommendations: None     GO             Christopher Waller B. [place], [facility], CCC-SLP [phone]   Christopher Waller 04/24/2015, 12:37 PM

## 2015-04-24 NOTE — Progress Notes (Signed)
PROGRESS NOTE  Christopher Waller ZOX:[id number] DOB: 11-07-1924 DOA: 04/20/2015 PCP: No PCP Per Patient  82 y.o. male PMHx DM Type 2, Chronic Systolic CHF, Dilated Cardiomyopathy, Pulmonary HTN, , CAD S/P CABG, S/P Biventricular implantable cardioverter-defibrillator medtronic,CKD , now with preserved LV function ( measured EF 35-40%- now found to be improved to 50-55%) , significant pulmonary hypertension and associated diastolic heart failure. Because of previous low EF an ICD had been implanted, he also has dyslipidemia and dementia, as well as diabetes. He was discharged on 116 after an admission for syncope and collapse in setting of bradycardia, hypoglycemia and hypotension presumably related to overdiuresis and dehydration in setting of significant aortic stenosis. At discharge his Januvia and glipizide were discontinued as well as his Lopressor, lisinopril and Lasix. Recommendations were to follow the patient closely to determine when to resume medications primarily diuretic. Patient was brought back to the ER today and Presents with shortness of breath and gurgling in his throat. He was trying to utilize Mucinex to see this would help. He reported productive cough of clear sputum. He denies any fevers chills or abdominal symptoms. During the last admission patient was found to have myoclonic jerking versus tremors and was started on low-dose Depakote. CT neck showed bilateral carotid bifurcation calcification with stenosis of the proximal right and left internal carotid artery, moderate left-sided pleural effusion   Assessment/Plan: Acute diastolic heart failure, NYHA class 1 plus RHF /pulmonary hypertension/aortic stenosis x-ray evidence of edema as well as pleural effusions, flu Negative , abnormal troponin, peak 0.79, trending down Continue Lasix 40 mg PO daily , not sure if the patient will be able to continue this at home given swallowing issues, lasix 20 mg at discharge History of  recent hypotensive syncope 2/2 anti HTN meds and diuretic Cont low dose metoprolol 12.5 mg twice a day monitoring for recurrent bradycardia Continue to hold lisinopril in the setting of diuresis and increasing creatinine 2-D echo 04/12/15, EF 50-55% with moderate aortic stenosis PT OT evaluation recommend SNF but family still deciding home with hospice vs SNF  Dysphagia Noted to have Severe pharyngeal phase dysphagia with decline  Speech therapy recommends NPO- Nephew would like to start comfort diet, despite the risk of aspiration  Acute on CK D stage III, creatinine worsening with lasix , reduce to 20 mg at DC    History of ischemic cardiomyopathy,LVEF 50-55% Continue metoprolol, s/p BiV -ICD placement, 100% paced -Also with moderate TR, moderate to severe pulmonary hypertension and moderate dilatation of right ventricle with reduced systolic function   Diabetes mellitus-stable  -oral medications discontinued last admission after hypoglycemic episode  -CBG slightly elevated at 173 -Follow CBGs and provide SSI   Pleural effusion -Secondary to diastolic heart failure with right heart failure -Careful diuresis as above   Myoclonic jerking -Continue Depakote, and psychiatry consultation during last admission 1/15 for confusion, deemed not to have capacity to make any decisions for himself  Hypokalemia-repleted    Biventricular implantable cardioverter-defibrillator medtronic followed by Dr. Graciela Husbands   Dyslipidemia -stable    Dementia-palliative care consultation for goals of care, recurrent admissions for weakness, dementia, confusion,dysphagia     Code Status: partial Family Communication: nephew- long discussion--- he will speak with wife-- said we need decision by tomm Disposition Plan:    Consultants:  Cards  Palliative care  Procedures:      HPI/Subjective: Really wants to go home  Objective: Filed Vitals:   04/23/15 1941 04/24/15 0536  BP:  143/69 136/74  Pulse: 85  82  Temp: 98 F (36.7 C) 98.3 F (36.8 C)  Resp: 19 16    Intake/Output Summary (Last 24 hours) at 04/24/15 1143 Last data filed at 04/24/15 0915  Gross per 24 hour  Intake    240 ml  Output   1100 ml  Net   -860 ml   Filed Weights   04/22/15 0446 04/23/15 0504 04/24/15 0536  Weight: 60.9 kg (134 lb 4.2 oz) 59.5 kg (131 lb 2.8 oz) 58 kg (127 lb 13.9 oz)    Exam:   General:  awake  Cardiovascular: rrr  Respiratory: no wheezing  Abdomen: +BS, soft  Musculoskeletal: no edema   Data Reviewed: Basic Metabolic Panel:  Recent Labs Lab 04/20/15 1302 04/21/15 0619 04/22/15 0506 04/23/15 0726 04/24/15 0615  NA 145 142 145 147* 148*  K 4.3 5.2* 3.3* 3.5 3.9  CL 104 101 101 105 96*  CO2  --  29 32 31 32  GLUCOSE 173* 132* 102* 127* 124*  BUN 31* 32*  CREATININE 0.90 1.29* 1.31* 1.53* 1.51*  CALCIUM  --  9.1 8.9 8.6* 8.6*   Liver Function Tests:  Recent Labs Lab 04/22/15 0506 04/23/15 0726 04/24/15 0615  AST 43* 46* 67*  ALT ALKPHOS 43 42 44  BILITOT 1.0 1.5* 1.5*  PROT 5.4* 5.7* 6.0*  ALBUMIN 2.4* 2.4* 2.6*   No results for input(s): LIPASE, AMYLASE in the last 168 hours. No results for input(s): AMMONIA in the last 168 hours. CBC:  Recent Labs Lab 04/20/15 1254 04/20/15 1302 04/22/15 0506 04/23/15 0726 04/24/15 0615  WBC 6.9  --  7.9 5.4 5.1  NEUTROABS 5.0  --   --   --   --   HGB 11.6* 13.3 11.6* 11.5* 12.2*  HCT 35.5* 39.0 35.4* 35.3* 38.3*  MCV 97.5  --  96.5 97.8 98.0  PLT 164  --  158 153 164   Cardiac Enzymes:  Recent Labs Lab 04/20/15 1946 04/21/15 0037 04/21/15 0619 04/21/15 1120  TROPONINI 0.26* 0.79* 0.63* 0.45*   BNP (last 3 results)  Recent Labs  04/20/15 1254  BNP 303.0*    ProBNP (last 3 results) No results for input(s): PROBNP in the last 8760 hours.  CBG:  Recent Labs Lab 04/23/15 1230 04/23/15 1619 04/23/15 2004 04/24/15 0031 04/24/15 0808  GLUCAP 120*  114* 118* 114* 118*    Recent Results (from the past 240 hour(s))  C difficile quick scan w PCR reflex     Status: None   Collection Time: 04/22/15  5:56 PM  Result Value Ref Range Status   C Diff antigen NEGATIVE NEGATIVE Final   C Diff toxin NEGATIVE NEGATIVE Final   C Diff interpretation Negative for toxigenic C. difficile  Final     Studies: No results found.  Scheduled Meds: . aspirin  81 mg Oral Daily  . divalproex  250 mg Oral 3 times per day  . enoxaparin (LOVENOX) injection  30 mg Subcutaneous Q24H  . fenofibrate  54 mg Oral Daily  . furosemide  40 mg Oral Daily  . guaiFENesin  600 mg Oral BID  . insulin aspart  0-9 Units Subcutaneous 6 times per day  . metoprolol tartrate  12.5 mg Oral BID  . sodium chloride  3 mL Intravenous Q12H   Continuous Infusions:  Antibiotics Given (last 72 hours)    None      Principal Problem:   Acute diastolic heart failure, NYHA class 1 (HCC)  Active Problems:   Myoclonic jerking   Dyslipidemia   Biventricular implantable cardioverter-defibrillator medtronic    History of ischemic cardiomyopathy   Dementia   Pulmonary hypertension (HCC)   Aortic stenosis   Pleural effusion   RHF (right heart failure) (HCC)   Diabetes mellitus, stable (HCC)   Acute diastolic heart failure (HCC)   Palliative care encounter   Dysphagia   Coronary artery disease due to lipid rich plaque    Time spent: 25 min    Skyra Crichlow [facility]  [facility] Pager [phone] If 7PM-7AM, please contact night-coverage at [email or web address], password [facility] 04/24/2015, 11:43 AM  LOS: 1 day

## 2015-04-25 LAB — GLUCOSE, CAPILLARY
GLUCOSE-CAPILLARY: 202 mg/dL — AB (ref 65–99)
GLUCOSE-CAPILLARY: 86 mg/dL (ref 65–99)
GLUCOSE-CAPILLARY: 89 mg/dL (ref 65–99)
GLUCOSE-CAPILLARY: 242 mg/dL — AB (ref 65–99)
Glucose-Capillary: 164 mg/dL — ABNORMAL HIGH (ref 65–99)

## 2015-04-25 MED ORDER — METOPROLOL TARTRATE 25 MG PO TABS: 12.5000 mg | ORAL_TABLET | Freq: Two times a day (BID) | ORAL | Status: DC

## 2015-04-25 MED ORDER — FUROSEMIDE 20 MG PO TABS: 20.0000 mg | ORAL_TABLET | Freq: Every day | ORAL | Status: DC

## 2015-04-25 NOTE — Discharge Summary (Signed)
Physician Discharge Summary  [facility] ZOX:[id number] DOB: 01-01-1925 DOA: 04/20/2015  PCP: No PCP Per Patient  Admit date: 04/20/2015 Discharge date: 04/25/2015  Time spent: 35 minutes  Recommendations for Outpatient Follow-up:  1. Home with hospice 2. DYS diet 3. Aspiration precuations   Discharge Diagnoses:  Principal Problem:   Acute diastolic heart failure, NYHA class 1 (HCC) Active Problems:   Myoclonic jerking   Dyslipidemia   Biventricular implantable cardioverter-defibrillator medtronic    History of ischemic cardiomyopathy   Dementia   Pulmonary hypertension (HCC)   Aortic stenosis   Pleural effusion   RHF (right heart failure) (HCC)   Diabetes mellitus, stable (HCC)   Acute diastolic heart failure (HCC)   Palliative care encounter   Dysphagia   Coronary artery disease due to lipid rich plaque   Discharge Condition: stable  Diet recommendation: DYS 1 diet with thin liquids  Filed Weights   04/23/15 0504 04/24/15 0536 04/25/15 0426  Weight: 59.5 kg (131 lb 2.8 oz) 58 kg (127 lb 13.9 oz) 58.4 kg (128 lb 12 oz)    History of present illness:  This is an 82 year old male patient with known history of aortic stenosis, history of ischemic cardiomyopathy now with preserved LV function based on most recent echo this past admission, significant pulmonary hypertension and associated diastolic heart failure. Because of previous low EF an ICD had been implanted, he also has dyslipidemia and dementia, as well as diabetes. He was discharged on 116 after an admission for syncope and collapse in setting of bradycardia, hypoglycemia and hypotension presumably related to overdiuresis and dehydration in setting of significant aortic stenosis. At discharge his Januvia and glipizide were discontinued as well as his Lopressor, lisinopril and Lasix. Recommendations were to follow the patient closely to determine when to resume medications primarily diuretic. Patient was brought back  to the ER today and her shortness of breath and gurgling in his throat. He was trying to utilize Mucinex to see this would help. He reported productive cough of clear sputum. He denies any fevers chills or abdominal symptoms. During the last admission patient was found to have myoclonic jerking versus tremors and was started on low-dose Depakote.  Hospital Course:  Acute diastolic heart failure, NYHA class 1 plus RHF /pulmonary hypertension/aortic stenosis x-ray evidence of edema as well as pleural effusions, flu Negative , abnormal troponin, peak 0.79, trending down Continue Lasix 40 mg PO daily , not sure if the patient will be able to continue this at home given swallowing issues, lasix 20 mg at discharge History of recent hypotensive syncope 2/2 anti HTN meds and diuretic Cont low dose metoprolol 12.5 mg twice a day monitoring for recurrent bradycardia Continue to hold lisinopril in the setting of diuresis and increasing creatinine 2-D echo 04/12/15, EF 50-55% with moderate aortic stenosis PT OT evaluation recommend SNF but family still deciding home with hospice vs SNF  Dysphagia Noted to have Severe pharyngeal phase dysphagia with decline  Speech therapy recommends NPO- Nephew would like to start comfort diet, despite the risk of aspiration  Acute on CK D stage III, creatinine worsening with lasix , reduce to 20 mg at DC    History of ischemic cardiomyopathy,LVEF 50-55% Continue metoprolol, s/p BiV -ICD placement, 100% paced -Also with moderate TR, moderate to severe pulmonary hypertension and moderate dilatation of right ventricle with reduced systolic function   Diabetes mellitus-stable  -allow blood sugars to be < 200   Pleural effusion -Secondary to diastolic heart failure with right heart  failure -Careful diuresis as above   Myoclonic jerking -Continue Depakote, and psychiatry consultation during last admission 1/15 for confusion, deemed not to have capacity to make  any decisions for himself  Hypokalemia-repleted    Biventricular implantable cardioverter-defibrillator medtronic followed by Dr. Graciela Husbands   Dyslipidemia -stable    Dementia- Home with hospice   Procedures:    Consultations:  Palliative care  Discharge Exam: Filed Vitals:   04/25/15 0800 04/25/15 1100  BP: 94/65 109/59  Pulse: 84 79  Temp: 97.3 F (36.3 C) 98.2 F (36.8 C)  Resp: 16 18    General: awake, NAD   Discharge Instructions   Discharge Instructions    Discharge instructions    Complete by:  As directed   Hospice to follow DYS 1 diet thin liquids     Increase activity slowly    Complete by:  As directed           Current Discharge Medication List    START taking these medications   Details  furosemide (LASIX) 20 MG tablet Take 1 tablet (20 mg total) by mouth daily. Qty: 30 tablet, Refills: 0    metoprolol tartrate (LOPRESSOR) 25 MG tablet Take 0.5 tablets (12.5 mg total) by mouth 2 (two) times daily. Qty: 60 tablet, Refills: 0      CONTINUE these medications which have NOT CHANGED   Details  aspirin 81 MG tablet Take 81 mg by mouth daily with breakfast.     divalproex (DEPAKOTE SPRINKLE) 125 MG capsule Take 2 capsules (250 mg total) by mouth every 8 (eight) hours.    fenofibrate (TRICOR) 145 MG tablet Take 145 mg by mouth at bedtime.        No Known Allergies Follow-up Information    Follow up with Janetta Hora, PA-C On 05/02/2015.   Specialties:  Cardiology, Radiology   Why:  10:00 AM   Contact information:   [address] [place] [address] [phone]       Follow up with Sherryl Manges, MD On 04/26/2015.   Specialty:  Cardiology   Why:  10:15 for defibulator check.     Contact information:   [address] [place] [address] [phone]        The results of significant diagnostics from this hospitalization (including imaging, microbiology, ancillary and laboratory) are  listed below for reference.    Significant Diagnostic Studies: Dg Chest 2 View  04/21/2015  CLINICAL DATA:  Acute diastolic heart failure EXAM: CHEST  2 VIEW COMPARISON:  04/20/2015 FINDINGS: Cardiomegaly again noted. Three leads cardiac pacemaker is unchanged in position. Persistent mild interstitial prominence bilateral probable mild interstitial edema. Again noted small bilateral pleural effusion with bilateral basilar atelectasis. Atherosclerotic calcifications of thoracic aorta. Osteopenia and degenerative changes thoracolumbar spine. Stable compression deformity upper lumbar spine. IMPRESSION: Three leads cardiac pacemaker is unchanged in position. Persistent mild interstitial prominence bilateral probable mild interstitial edema. Again noted small bilateral pleural effusion with bilateral basilar atelectasis. Electronically Signed   By: Natasha Mead M.D.   On: 04/21/2015 09:22   Dg Chest 2 View  04/20/2015  CLINICAL DATA:  82 year old male with wheezing, shortness of breath chest EXAM: CHEST  2 VIEW COMPARISON:  Prior chest x-ray 04/13/2015 FINDINGS: Stable position of left subclavian approach biventricular cardiac rhythm maintenance device. The leads project over the right atrium, right ventricle and overlying the left ventricle. Cardiac and mediastinal contours are unchanged. Atherosclerotic calcifications present in the transverse aorta. Patient is  status post median sternotomy. Slightly increased pulmonary vascular congestion without overt edema. No pneumothorax. Small bilateral pleural effusions. Compression deformity of L1. No acute osseous abnormality. IMPRESSION: 1. Slightly increased pulmonary vascular congestion bordering on mild edema. 2. Small bilateral pleural effusions. 3. Stable cardiomegaly. 4. Age indeterminate L1 compression fracture.  Favor remote. Electronically Signed   By: Malachy Moan M.D.   On: 04/20/2015 13:48   Ct Soft Tissue Neck W Contrast  04/20/2015  CLINICAL DATA:   82 year old diabetic male with dementia coughing or choking to the large secretions. Initial encounter. EXAM: CT NECK WITH CONTRAST TECHNIQUE: Multidetector CT imaging of the neck was performed using the standard protocol following the bolus administration of intravenous contrast. CONTRAST:  75mL OMNIPAQUE IOHEXOL 300 MG/ML  SOLN COMPARISON:  None. FINDINGS: Pharynx and larynx: Negative. Salivary glands: Negative. Thyroid: Negative. Lymph nodes: No adenopathy. Vascular: Significant atherosclerotic changes including bilateral carotid bifurcation calcification with 62% diameter stenosis proximal right internal carotid artery and 64% diameter stenosis proximal left internal carotid artery. Moderate to marked narrowing right vertebral artery and mild to moderate narrowing left vertebral artery at the level of foramen magnum. Limited intracranial: Atrophy and small vessel disease changes. Visualized orbits: Limited evaluation and unremarkable. Mastoids and visualized paranasal sinuses: Minimal polypoid opacification inferior right maxillary sinus. Skeleton: Degenerative changes cervical spine most notable C3-4 and C5-6 with fusion C4-5. Remote anterior wedge compression deformity T2 with 50% loss height anteriorly. Upper chest: Small to slightly moderate-size left-sided and small right-sided pleural effusion with adjacent mild atelectasis. Pacemaker in place. IMPRESSION: Exam limited by motion and dental artifact without pharyngeal mass noted. Bilateral carotid bifurcation calcification with 62% diameter stenosis proximal right internal carotid artery and 64% diameter stenosis proximal left internal carotid artery. Moderate to marked narrowing right vertebral artery and mild to moderate narrowing left vertebral artery at the level of foramen magnum. Degenerative changes cervical spine most notable C3-4 and C5-6 with fusion C4-5. Remote anterior wedge compression deformity T2 with 50% loss height anteriorly. Small to  slightly moderate-size left-sided and small right-sided pleural effusion with adjacent mild atelectasis. Electronically Signed   By: Lacy Duverney M.D.   On: 04/20/2015 15:20   Dg Chest Port 1 View  04/13/2015  CLINICAL DATA:  Pneumonia.  Short of breath. EXAM: PORTABLE CHEST 1 VIEW COMPARISON:  04/11/2015 FINDINGS: Heart remains mildly enlarged. Left subclavian AICD device is stable. Sternal wires are stable. Lungs under aerated and grossly clear. No pneumothorax. IMPRESSION: No active disease. Electronically Signed   By: Jolaine Click M.D.   On: 04/13/2015 10:21   Dg Chest Port 1 View  04/11/2015  CLINICAL DATA:  82 year old male with hypotension EXAM: PORTABLE CHEST 1 VIEW COMPARISON:  None. FINDINGS: Single-view of the chest does not demonstrate a focal consolidation. There is no pleural effusion or pneumothorax. Mild cardiomegaly. Left pectoral AICD device. Median sternotomy wires and CABG vascular clips. The osseous structures are grossly unremarkable with IMPRESSION: No acute cardiopulmonary process. Electronically Signed   By: Elgie Collard M.D.   On: 04/11/2015 01:36   Dg Swallowing Func-speech Pathology  04/22/2015  Objective Swallowing Evaluation:   MBS Patient Details Name: Garwood Wentzell MRN: [id number] Date of Birth: 1924/06/20 Today's Date: 04/22/2015 Time: SLP Start Time (ACUTE ONLY): 0818-SLP Stop Time (ACUTE ONLY): 0837 SLP Time Calculation (min) (ACUTE ONLY): 19 min Past Medical History: Past Medical History Diagnosis Date . Coronary artery disease  . Dementia  . Diabetes mellitus without complication (HCC)  . Hyperlipidemia  . Ischemic cardiomyopathy 01/05/2015 .  Biventricular implantable cardioverter-defibrillator medtronic  01/05/2015 Past Surgical History: Past Surgical History Procedure Laterality Date . Pacemaker insertion   . Coronary artery bypass graft   HPI: Pt is an 82 y.o. male with PMH of aortic stenosis, history of ischemic cardiomyopathy, significant pulmonary hypertension  and associated diastolic heart failure, dyslipidemia and dementia and diabetes. Per chart, pt was discharged on 1/16 after an admission for syncope and collapse in setting of bradycardia, hypoglycemia and hypotension. Admitted 1/21 for shortness of breath and gurgling in throat. CXR 1/22 showed bilateral pleural effusions with bilateral basilar atelectasis. Pt had MBS 01/06/15 which showed mild oral, moderate-severe pharyngeal dysphagia with suspected esophageal component- premature spill with large amount of pharyngeal penetration and poor sensation, also poor laryngeal elevation, tongue base retraction, and pharyngeal contraction. Dysphagia 3/ thin liquids were recommended. MBS recommended given history. No Data Recorded Assessment / Plan / Recommendation CHL IP CLINICAL IMPRESSIONS 04/22/2015 Therapy Diagnosis Severe pharyngeal phase dysphagia;Moderate pharyngeal phase dysphagia;Mild oral phase dysphagia Clinical Impression Pt's swallow function has deteriorated from MBS in 12/2014. Tongue base retraction, laryngeal elevation significantly decreased resulting in mod-max vallecular and pyriform sinus residue. Therapeutic strategies were unsuccessful to decrease pharyngeal residue to a safe amount and pt silently penetrated and aspirated in between and during swallows. Thin and nectar thick barium yielded similar volumes of pharyngeal residue. Brief esophageal scan did not reveal overt abnormalities (MBS does not diagnose below level of UES). Pt has chronic dysphagia and currently is at high risk with all consistencies. Results discussed with Dr. Susie Cassette who stated she will discuss wishes for nutrition with family. Will follow up for any further education.       Impact on safety and function Severe aspiration risk   CHL IP TREATMENT RECOMMENDATION 01/06/2015 Treatment Recommendations Therapy as outlined in treatment plan below   Prognosis 01/06/2015 Prognosis for Safe Diet Advancement Good Barriers to Reach Goals  Cognitive deficits;Time post onset Barriers/Prognosis Comment -- CHL IP DIET RECOMMENDATION 04/22/2015 SLP Diet Recommendations NPO Liquid Administration via -- Medication Administration -- Compensations -- Postural Changes --   CHL IP OTHER RECOMMENDATIONS 04/22/2015 Recommended Consults -- Oral Care Recommendations Oral care QID Other Recommendations --   CHL IP FOLLOW UP RECOMMENDATIONS 04/22/2015 Follow up Recommendations None   CHL IP FREQUENCY AND DURATION 01/06/2015 Speech Therapy Frequency (ACUTE ONLY) min 2x/week Treatment Duration 2 weeks      CHL IP ORAL PHASE 04/22/2015 Oral Phase Impaired Oral - Pudding Teaspoon -- Oral - Pudding Cup -- Oral - Honey Teaspoon -- Oral - Honey Cup -- Oral - Nectar Teaspoon -- Oral - Nectar Cup -- Oral - Nectar Straw -- Oral - Thin Teaspoon -- Oral - Thin Cup -- Oral - Thin Straw -- Oral - Puree -- Oral - Mech Soft -- Oral - Regular Delayed oral transit;Weak lingual manipulation Oral - Multi-Consistency -- Oral - Pill -- Oral Phase - Comment --  CHL IP PHARYNGEAL PHASE 04/22/2015 Pharyngeal Phase Impaired Pharyngeal- Pudding Teaspoon -- Pharyngeal -- Pharyngeal- Pudding Cup -- Pharyngeal -- Pharyngeal- Honey Teaspoon -- Pharyngeal -- Pharyngeal- Honey Cup -- Pharyngeal -- Pharyngeal- Nectar Teaspoon -- Pharyngeal -- Pharyngeal- Nectar Cup Pharyngeal residue - valleculae;Pharyngeal residue - pyriform;Reduced tongue base retraction;Reduced laryngeal elevation;Penetration/Aspiration during swallow Pharyngeal Material enters airway, CONTACTS cords and not ejected out Pharyngeal- Nectar Straw -- Pharyngeal -- Pharyngeal- Thin Teaspoon Delayed swallow initiation-pyriform sinuses;Pharyngeal residue - valleculae;Pharyngeal residue - pyriform;Reduced tongue base retraction;Reduced laryngeal elevation Pharyngeal -- Pharyngeal- Thin Cup Delayed swallow initiation-pyriform sinuses;Pharyngeal residue - valleculae;Pharyngeal residue -  pyriform;Reduced tongue base retraction;Reduced laryngeal  elevation Pharyngeal -- Pharyngeal- Thin Straw -- Pharyngeal -- Pharyngeal- Puree Penetration/Aspiration during swallow;Pharyngeal residue - valleculae;Pharyngeal residue - pyriform;Reduced laryngeal elevation;Reduced tongue base retraction Pharyngeal Material enters airway, passes BELOW cords then ejected out;Material enters airway, remains ABOVE vocal cords and not ejected out Pharyngeal- Mechanical Soft -- Pharyngeal -- Pharyngeal- Regular Pharyngeal residue - valleculae;Pharyngeal residue - pyriform;Reduced tongue base retraction;Reduced laryngeal elevation Pharyngeal -- Pharyngeal- Multi-consistency -- Pharyngeal -- Pharyngeal- Pill -- Pharyngeal -- Pharyngeal Comment --  CHL IP CERVICAL ESOPHAGEAL PHASE 04/22/2015 Cervical Esophageal Phase WFL Pudding Teaspoon -- Pudding Cup -- Honey Teaspoon -- Honey Cup -- Nectar Teaspoon -- Nectar Cup -- Nectar Straw -- Thin Teaspoon -- Thin Cup -- Thin Straw -- Puree -- Mechanical Soft -- Regular -- Multi-consistency -- Pill -- Cervical Esophageal Comment -- CHL IP GO 04/22/2015 Functional Assessment Tool Used skilled clinical judgement Functional Limitations Swallowing Swallow Current Status (Z6109) CL Swallow Goal Status (U0454) CK Swallow Discharge Status (U9811) (None) Motor Speech Current Status (B1478) (None) Motor Speech Goal Status (G9562) (None) Motor Speech Goal Status (Z3086) (None) Spoken Language Comprehension Current Status (V7846) (None) Spoken Language Comprehension Goal Status (N6295) (None) Spoken Language Comprehension Discharge Status (M8413) (None) Spoken Language Expression Current Status (K4401) (None) Spoken Language Expression Goal Status (U2725) (None) Spoken Language Expression Discharge Status [phone] (None) Attention Current Status (I3474) (None) Attention Goal Status (Q5956) (None) Attention Discharge Status (L8756) (None) Memory Current Status (E3329) (None) Memory Goal Status (J1884) (None) Memory Discharge Status (Z6606) (None) Voice Current  Status (T0160) (None) Voice Goal Status (F0932) (None) Voice Discharge Status (T5573) (None) Other Speech-Language Pathology Functional Limitation [phone]) (None) Other Speech-Language Pathology Functional Limitation Goal Status (K2706) (None) Other Speech-Language Pathology Functional Limitation Discharge Status [phone]) (None) Royce Macadamia 04/22/2015, 10:32 AM Breck Coons Lonell Face.Ed CCC-SLP Pager [phone]               Microbiology: Recent Results (from the past 240 hour(s))  C difficile quick scan w PCR reflex     Status: None   Collection Time: 04/22/15  5:56 PM  Result Value Ref Range Status   C Diff antigen NEGATIVE NEGATIVE Final   C Diff toxin NEGATIVE NEGATIVE Final   C Diff interpretation Negative for toxigenic C. difficile  Final     Labs: Basic Metabolic Panel:  Recent Labs Lab 04/20/15 1302 04/21/15 0619 04/22/15 0506 04/23/15 0726 04/24/15 0615  NA 145 142 145 147* 148*  K 4.3 5.2* 3.3* 3.5 3.9  CL 104 101 101 105 96*  CO2  --  29 32 31 32  GLUCOSE 173* 132* 102* 127* 124*  BUN 31* 32*  CREATININE 0.90 1.29* 1.31* 1.53* 1.51*  CALCIUM  --  9.1 8.9 8.6* 8.6*   Liver Function Tests:  Recent Labs Lab 04/22/15 0506 04/23/15 0726 04/24/15 0615  AST 43* 46* 67*  ALT ALKPHOS 43 42 44  BILITOT 1.0 1.5* 1.5*  PROT 5.4* 5.7* 6.0*  ALBUMIN 2.4* 2.4* 2.6*   No results for input(s): LIPASE, AMYLASE in the last 168 hours. No results for input(s): AMMONIA in the last 168 hours. CBC:  Recent Labs Lab 04/20/15 1254 04/20/15 1302 04/22/15 0506 04/23/15 0726 04/24/15 0615  WBC 6.9  --  7.9 5.4 5.1  NEUTROABS 5.0  --   --   --   --   HGB 11.6* 13.3 11.6* 11.5* 12.2*  HCT 35.5* 39.0 35.4* 35.3* 38.3*  MCV 97.5  --  96.5 97.8 98.0  PLT 164  --  158 153 164   Cardiac Enzymes:  Recent Labs Lab 04/20/15 1946 04/21/15 0037 04/21/15 0619 04/21/15 1120  TROPONINI 0.26* 0.79* 0.63* 0.45*   BNP: BNP (last 3 results)  Recent Labs   04/20/15 1254  BNP 303.0*    ProBNP (last 3 results) No results for input(s): PROBNP in the last 8760 hours.  CBG:  Recent Labs Lab 04/24/15 1956 04/25/15 0124 04/25/15 0426 04/25/15 0645 04/25/15 1111  GLUCAP 112* 202* 86 89 164*       Signed:  Jaskiran Pata U Derril Franek DO  [facility] 04/25/2015, 11:57 AM

## 2015-04-25 NOTE — Progress Notes (Signed)
Received referral  From Helmut Muster NP with Hospice to Please help arrange home hospice with [facility]. Contact nephew, [name], for details. Also will need transportation home after 3:30pm  Referral placed as requested, TCT Bambi with [facility] / clinical information faxed as requested. Lupita Leash Soc Worker made aware of transportation needs. Abelino Derrick [facility] [phone]

## 2015-04-25 NOTE — Progress Notes (Signed)
Called nephew to inform him of the ambulance transfer's departure to their home.  Printed the AVS, placed in envelope, and requested that ambulance technician hand deliver to nephew.  Asked nephew to call me should he have any additional questions r/t discharge instructions.  Pt in no acute distress.  IV removed.  CCMD notified.

## 2015-04-25 NOTE — Progress Notes (Signed)
CSW discussed patient with Dr. Benjamine Mola who indicated that nephew has elected to take patient home with [facility].  Unit RNCM arranged services with [facility] and CSW arrange for ambulance transport home. Patient noted to be very quiet but stated he was glad to be going.  CSW did not speak with nephew but discussed with unit RN who indicated that she had contacted nephew and he was aware of return home.  No further CSW needs identified. CSW signing off.  Lorri Frederick. Jaci Lazier, [place] [phone]

## 2015-04-26 ENCOUNTER — Encounter: Payer: Medicare Other | Admitting: Internal Medicine

## 2015-04-30 NOTE — Progress Notes (Signed)
This encounter was created in error - please disregard.

## 2015-05-02 ENCOUNTER — Encounter: Payer: Self-pay | Admitting: Physician Assistant

## 2015-05-04 ENCOUNTER — Emergency Department (HOSPITAL_COMMUNITY): Payer: Medicare Other

## 2015-05-04 ENCOUNTER — Inpatient Hospital Stay (HOSPITAL_COMMUNITY)
Admission: EM | Admit: 2015-05-04 | Discharge: 2015-05-09 | DRG: 689 | Disposition: A | Discharge status: Skilled Nursing Facility | Payer: Medicare Other | Attending: Internal Medicine | Admitting: Internal Medicine

## 2015-05-04 ENCOUNTER — Encounter (HOSPITAL_COMMUNITY): Payer: Self-pay | Admitting: *Deleted

## 2015-05-04 DIAGNOSIS — Z95810 Presence of automatic (implantable) cardiac defibrillator: Secondary | ICD-10-CM

## 2015-05-04 DIAGNOSIS — Z87891 Personal history of nicotine dependence: Secondary | ICD-10-CM

## 2015-05-04 DIAGNOSIS — I5032 Chronic diastolic (congestive) heart failure: Secondary | ICD-10-CM | POA: Diagnosis present

## 2015-05-04 DIAGNOSIS — N183 Chronic kidney disease, stage 3 (moderate): Secondary | ICD-10-CM | POA: Diagnosis present

## 2015-05-04 DIAGNOSIS — Z8249 Family history of ischemic heart disease and other diseases of the circulatory system: Secondary | ICD-10-CM

## 2015-05-04 DIAGNOSIS — E1165 Type 2 diabetes mellitus with hyperglycemia: Secondary | ICD-10-CM | POA: Diagnosis present

## 2015-05-04 DIAGNOSIS — R531 Weakness: Secondary | ICD-10-CM | POA: Diagnosis not present

## 2015-05-04 DIAGNOSIS — N289 Disorder of kidney and ureter, unspecified: Secondary | ICD-10-CM

## 2015-05-04 DIAGNOSIS — N400 Enlarged prostate without lower urinary tract symptoms: Secondary | ICD-10-CM | POA: Diagnosis present

## 2015-05-04 DIAGNOSIS — Z515 Encounter for palliative care: Secondary | ICD-10-CM | POA: Diagnosis present

## 2015-05-04 DIAGNOSIS — I350 Nonrheumatic aortic (valve) stenosis: Secondary | ICD-10-CM | POA: Diagnosis present

## 2015-05-04 DIAGNOSIS — G9341 Metabolic encephalopathy: Secondary | ICD-10-CM | POA: Diagnosis present

## 2015-05-04 DIAGNOSIS — I255 Ischemic cardiomyopathy: Secondary | ICD-10-CM | POA: Diagnosis present

## 2015-05-04 DIAGNOSIS — I129 Hypertensive chronic kidney disease with stage 1 through stage 4 chronic kidney disease, or unspecified chronic kidney disease: Secondary | ICD-10-CM | POA: Diagnosis present

## 2015-05-04 DIAGNOSIS — N390 Urinary tract infection, site not specified: Principal | ICD-10-CM | POA: Diagnosis present

## 2015-05-04 DIAGNOSIS — I272 Other secondary pulmonary hypertension: Secondary | ICD-10-CM | POA: Diagnosis present

## 2015-05-04 DIAGNOSIS — F0390 Unspecified dementia without behavioral disturbance: Secondary | ICD-10-CM | POA: Diagnosis present

## 2015-05-04 DIAGNOSIS — E1122 Type 2 diabetes mellitus with diabetic chronic kidney disease: Secondary | ICD-10-CM

## 2015-05-04 DIAGNOSIS — E11649 Type 2 diabetes mellitus with hypoglycemia without coma: Secondary | ICD-10-CM | POA: Diagnosis not present

## 2015-05-04 DIAGNOSIS — Z8679 Personal history of other diseases of the circulatory system: Secondary | ICD-10-CM

## 2015-05-04 DIAGNOSIS — I2583 Coronary atherosclerosis due to lipid rich plaque: Secondary | ICD-10-CM | POA: Diagnosis present

## 2015-05-04 DIAGNOSIS — E785 Hyperlipidemia, unspecified: Secondary | ICD-10-CM | POA: Diagnosis present

## 2015-05-04 DIAGNOSIS — G253 Myoclonus: Secondary | ICD-10-CM | POA: Diagnosis present

## 2015-05-04 DIAGNOSIS — N189 Chronic kidney disease, unspecified: Secondary | ICD-10-CM

## 2015-05-04 DIAGNOSIS — E860 Dehydration: Secondary | ICD-10-CM | POA: Diagnosis present

## 2015-05-04 DIAGNOSIS — Z951 Presence of aortocoronary bypass graft: Secondary | ICD-10-CM

## 2015-05-04 DIAGNOSIS — Z66 Do not resuscitate: Secondary | ICD-10-CM | POA: Diagnosis present

## 2015-05-04 DIAGNOSIS — E119 Type 2 diabetes mellitus without complications: Secondary | ICD-10-CM

## 2015-05-04 DIAGNOSIS — Z993 Dependence on wheelchair: Secondary | ICD-10-CM

## 2015-05-04 DIAGNOSIS — F0391 Unspecified dementia with behavioral disturbance: Secondary | ICD-10-CM

## 2015-05-04 DIAGNOSIS — N179 Acute kidney failure, unspecified: Secondary | ICD-10-CM | POA: Diagnosis present

## 2015-05-04 DIAGNOSIS — Z7401 Bed confinement status: Secondary | ICD-10-CM

## 2015-05-04 DIAGNOSIS — I2510 Atherosclerotic heart disease of native coronary artery without angina pectoris: Secondary | ICD-10-CM | POA: Diagnosis present

## 2015-05-04 LAB — COMPREHENSIVE METABOLIC PANEL
ALT: 11 U/L — ABNORMAL LOW (ref 17–63)
AST: 24 U/L (ref 15–41)
Albumin: 2.7 g/dL — ABNORMAL LOW (ref 3.5–5.0)
Alkaline Phosphatase: 39 U/L (ref 38–126)
Anion gap: 12 (ref 5–15)
BUN: 60 mg/dL — ABNORMAL HIGH (ref 6–20)
CHLORIDE: 93 mmol/L — AB (ref 101–111)
CO2: 28 mmol/L (ref 22–32)
CREATININE: 2.37 mg/dL — AB (ref 0.61–1.24)
Calcium: 8.8 mg/dL — ABNORMAL LOW (ref 8.9–10.3)
GFR, EST AFRICAN AMERICAN: 27 mL/min — AB (ref >60)
GFR, EST NON AFRICAN AMERICAN: 23 mL/min — AB (ref >60)
Glucose, Bld: 202 mg/dL — ABNORMAL HIGH (ref 65–99)
POTASSIUM: 4.5 mmol/L (ref 3.5–5.1)
Sodium: 133 mmol/L — ABNORMAL LOW (ref 135–145)
TOTAL PROTEIN: 6.4 g/dL — AB (ref 6.5–8.1)
Total Bilirubin: 0.8 mg/dL (ref 0.3–1.2)

## 2015-05-04 LAB — URINALYSIS, ROUTINE W REFLEX MICROSCOPIC
Bilirubin Urine: NEGATIVE
Glucose, UA: NEGATIVE mg/dL
Ketones, ur: NEGATIVE mg/dL
NITRITE: NEGATIVE
PROTEIN: 100 mg/dL — AB
SPECIFIC GRAVITY, URINE: 1.012 (ref 1.005–1.030)
pH: 6 (ref 5.0–8.0)

## 2015-05-04 LAB — CBC WITH DIFFERENTIAL/PLATELET
BASOS ABS: 0 10*3/uL (ref 0.0–0.1)
Basophils Relative: 0 %
EOS ABS: 0 10*3/uL (ref 0.0–0.7)
Eosinophils Relative: 0 %
HCT: 38 % — ABNORMAL LOW (ref 39.0–52.0)
HEMOGLOBIN: 12.4 g/dL — AB (ref 13.0–17.0)
LYMPHS PCT: 19 %
Lymphs Abs: 1.4 10*3/uL (ref 0.7–4.0)
MCH: 31.2 pg (ref 26.0–34.0)
MCHC: 32.6 g/dL (ref 30.0–36.0)
MCV: 95.5 fL (ref 78.0–100.0)
Monocytes Absolute: 1.5 10*3/uL — ABNORMAL HIGH (ref 0.1–1.0)
Monocytes Relative: 20 %
NEUTROS PCT: 61 %
Neutro Abs: 4.5 10*3/uL (ref 1.7–7.7)
Platelets: 249 10*3/uL (ref 150–400)
RBC: 3.98 MIL/uL — AB (ref 4.22–5.81)
RDW: 13.6 % (ref 11.5–15.5)
WBC: 7.4 10*3/uL (ref 4.0–10.5)

## 2015-05-04 LAB — URINE MICROSCOPIC-ADD ON

## 2015-05-04 LAB — TROPONIN I: TROPONIN I: 0.06 ng/mL — AB (ref <0.031)

## 2015-05-04 NOTE — ED Notes (Signed)
X-ray at bedside

## 2015-05-04 NOTE — ED Notes (Signed)
UNABLE TO OBTAIN EKG D/T PT UNCONTROLLABLY SHAKING RN JAKE AND EDP PICKERING AWARE

## 2015-05-04 NOTE — ED Notes (Signed)
Hospice called. They state that pt shakes at baseline. Additionally, they report that depakote that pt usually takes has been held since Tuesday. They also state that pt has been unable to sleep at night for the past few days and is becoming disruptive at home during the night.  Hospice call back [phone]

## 2015-05-04 NOTE — ED Notes (Signed)
Bed: WU98 Expected date: 05/04/15 Expected time: 6:31 PM Means of arrival: Ambulance Comments: Ca pt

## 2015-05-04 NOTE — ED Notes (Signed)
Patient is from home with family and hospice care. The patient was seen by his hospice nurse today and according to EMS patient is at his baseline but family wanted him sent out. The nature of the call was unclear. The patient has been weak for some time and all medications have been stopped. The patient's code status is unknown. Author attempted to contact family to obtain more information, but was unsuccessful. Patient responds appropriately to questions asked.

## 2015-05-04 NOTE — ED Notes (Signed)
Nurse drawing labs. 

## 2015-05-04 NOTE — ED Notes (Signed)
Patient's nephew called and states that the patient was complaining of shaking which is consistent with the patient's history of myoclonic jerking. Patient is being treated for UTI. He just started the antibiotics today and his blood sugars been running high because he is no longer on medications for diabetes. The nephew states that medication were not stopped due to hospice but only altered due to patient's dysphagia. Patient is a DNR.

## 2015-05-04 NOTE — ED Provider Notes (Signed)
CSN: [id number]     Arrival date & time 05/04/15  1840 History   First MD Initiated Contact with Patient 05/04/15 1848     Chief Complaint  Patient presents with  . Weakness    Level  5 caveat due to dementia Patient is a 82 y.o. male presenting with weakness.  Weakness   patient was reportedly brought in from home by ambulance. Family reportedly wanted patient to be seen. Reported more week. He has hospice at home but is is not on comfort care yet. Patient cannot tell me why he is here. Recent admission to the hospital. I talked with [facility] hospitalist and reportedly has been more weak. Has been on amoxicillin for UTI. Mild hypoxia upon arrival. The hospice nurse thought that the family has not been giving him his medication. The nurse stated that they've been trying to get medicines for him to sleep. Reportedly has not slept well the last 3 days. Her opinion was that family may have needed to sleep and that they were hoping for placement.  Past Medical History  Diagnosis Date  . Coronary artery disease   . Dementia   . Diabetes mellitus without complication (HCC)   . Hyperlipidemia   . Ischemic cardiomyopathy 01/05/2015  . Biventricular implantable cardioverter-defibrillator medtronic  01/05/2015   Past Surgical History  Procedure Laterality Date  . Pacemaker insertion    . Coronary artery bypass graft     Family History  Problem Relation Age of Onset  . Hypertension Other    Social History  Substance Use Topics  . Smoking status: Former Smoker    Quit date: 03/30/1964  . Smokeless tobacco: None  . Alcohol Use: No    Review of Systems  Unable to perform ROS: Dementia  Neurological: Positive for weakness.      Allergies  Review of patient's allergies indicates no known allergies.  Home Medications   Prior to Admission medications   Medication Sig Start Date End Date Taking? Authorizing Provider  aspirin 81 MG tablet Take 81 mg by mouth daily with breakfast. Reported  on 05/04/2015    Historical Provider, MD  divalproex (DEPAKOTE SPRINKLE) 125 MG capsule Take 2 capsules (250 mg total) by mouth every 8 (eight) hours. Patient not taking: Reported on 05/04/2015 01/07/15   [name], MD  fenofibrate (TRICOR) 145 MG tablet Take 145 mg by mouth at bedtime. Reported on 05/04/2015    Historical Provider, MD  furosemide (LASIX) 20 MG tablet Take 1 tablet (20 mg total) by mouth daily. Patient not taking: Reported on 05/04/2015 04/25/15   Joseph Art, DO  metoprolol tartrate (LOPRESSOR) 25 MG tablet Take 0.5 tablets (12.5 mg total) by mouth 2 (two) times daily. Patient not taking: Reported on 05/04/2015 04/25/15   Selinda Orion Vann, DO   BP 250/160 mmHg  Pulse 70  Temp(Src) 98.4 F (36.9 C) (Oral)  Resp 19  SpO2 100% Physical Exam  Constitutional: He appears well-developed.  HENT:  Head: Atraumatic.  Mucus membranes are dry.  Cardiovascular: Normal rate.   Pulmonary/Chest: Effort normal.  Abdominal: Soft. There is no tenderness.  Neurological: He is alert.  Tremulous.  Skin: Skin is warm.    ED Course  Procedures (including critical care time) Labs Review Labs Reviewed  COMPREHENSIVE METABOLIC PANEL - Abnormal; Notable for the following:    Sodium 133 (*)    Chloride 93 (*)    Glucose, Bld 202 (*)    BUN 60 (*)    Creatinine, Ser  2.37 (*)    Calcium 8.8 (*)    Total Protein 6.4 (*)    Albumin 2.7 (*)    ALT 11 (*)    GFR calc non Af Amer 23 (*)    GFR calc Af Amer 27 (*)    All other components within normal limits  CBC WITH DIFFERENTIAL/PLATELET - Abnormal; Notable for the following:    RBC 3.98 (*)    Hemoglobin 12.4 (*)    HCT 38.0 (*)    Monocytes Absolute 1.5 (*)    All other components within normal limits  URINALYSIS, ROUTINE W REFLEX MICROSCOPIC (NOT AT [name]) - Abnormal; Notable for the following:    Color, Urine AMBER (*)    APPearance TURBID (*)    Hgb urine dipstick MODERATE (*)    Protein, ur 100 (*)    Leukocytes, UA LARGE (*)     All other components within normal limits  TROPONIN I - Abnormal; Notable for the following:    Troponin I 0.06 (*)    All other components within normal limits  URINE MICROSCOPIC-ADD ON - Abnormal; Notable for the following:    Squamous Epithelial / LPF FIELD OBSCURED BY WBC'S (*)    Bacteria, UA FIELD OBSCURED BY WBC'S (*)    All other components within normal limits  URINE CULTURE  MAGNESIUM  PHOSPHORUS  TSH  COMPREHENSIVE METABOLIC PANEL  CBC    Imaging Review Dg Chest Portable 1 View  05/04/2015  CLINICAL DATA:  Short of breath EXAM: PORTABLE CHEST 1 VIEW COMPARISON:  04/21/2015 FINDINGS: AICD remains in good position. Negative for heart failure. Negative for pneumonia or effusion. Lungs are clear with improved aeration since prior study IMPRESSION: No active disease. Electronically Signed   By: Marlan Palau M.D.   On: 05/04/2015 19:45   I have personally reviewed and evaluated these images and lab results as part of my medical decision-making.   EKG Interpretation None      MDM   Final diagnoses:  Lower urinary tract infection  Renal insufficiency    Patient with worsening fatigue. Has urinary tract infection. Is on hospice. Discuss with hospice nurse. Have not been able to talk to family members. Creatinine is increased. Will admit to internal medicine.    Benjiman Core, MD 05/05/15 0030

## 2015-05-04 NOTE — H&P (Signed)
PCP: none   Referring provider [place]   Chief Complaint: Fatigue, chills, confusion  HPI: Christopher Waller is a 82 y.o. male   has a past medical history of Coronary artery disease; Dementia; Diabetes mellitus without complication (HCC); Hyperlipidemia; Ischemic cardiomyopathy (01/05/2015); and Biventricular implantable cardioverter-defibrillator medtronic  (01/05/2015).   Presented with fatigue. Notes state that he was evaluated by hospice nurse today was felt to be at his baseline but family wanted him to be 'sent out'. Patient's nephew  called in and reported that he was disturbed by patient's jerking which she has a history of myoclonic jerking and supposed to be on Depakote. Apparently Depakote has been held for the past few days for unclear reason.  Family reporting the patient's been recently started on treatment for urinary tract infection with amoxicillin but only had one dose and he was still having significant chills. His blood sugar has been elevated up to 350 at home secondary to patient being no longer on medications family was concerned that she's medications were discontinued this to be some medicine distended regarding goals of care but per notes patient family confirmed that he is DO NOT RESUSCITATE ok to give IV antibiotics and IV fluids but no aggressive interventions. Family did state that   the patient have had poor sleep for the past 3 days and they will attempting to obtain some medications to help him sleep. Family did report to the nursing staff that patient has been disruptive during the night. He has been having chills and calling out repeatedly.   IN ER: Creatinine noted to be up from baseline of 1.5 at 2.37 troponin elevated 0.06 but this is down from prior 22nd of January troponin was 0.45. UA showing too numerous to count white blood cell count urine culture sent Assets are showing no evidence of pneumonia  Regarding pertinent past history: Patient has been  recently admitted with acute diastolic failure and discharge home with hospice. He has known history of aortic stenosis and ischemic cardiomyopathy although his most recent echo gram and 13 for January showed EF of 50-55 percent. Of note the admission just prior to that was secondary to overdiuresis and syncope. A shin has history of myoclonic jerking for which she is on Depakote. During his hospital hospitalization patient was evaluated by PT and OT which recommended nursing home placement but family (nephew) at that time declined and took patient home with hospice. Patient has history of severe dysphagia with current on comfort feedings. He has history of diabetes but this been well controlled  Hospitalist was called for admission for UTI failure outpatient treatment  Review of Systems:    Pertinent positives include: Fatigue and insomnia tremor weight loss   Constitutional:  No weight loss, night sweats, Fevers, chills,   HEENT:  No headaches, Difficulty swallowing,Tooth/dental problems,Sore throat,  No sneezing, itching, ear ache, nasal congestion, post nasal drip,  Cardio-vascular:  No chest pain, Orthopnea, PND, anasarca, dizziness, palpitations.no Bilateral lower extremity swelling  GI:  No heartburn, indigestion, abdominal pain, nausea, vomiting, diarrhea, change in bowel habits, loss of appetite, melena, blood in stool, hematemesis Resp:  no shortness of breath at rest. No dyspnea on exertion, No excess mucus, no productive cough, No non-productive cough, No coughing up of blood.No change in color of mucus.No wheezing. Skin:  no rash or lesions. No jaundice GU:  no dysuria, change in color of urine, no urgency or frequency. No straining to urinate.  No flank pain.  Musculoskeletal:  No joint  pain or no joint swelling. No decreased range of motion. No back pain.  Psych:  No change in mood or affect. No depression or anxiety. No memory loss.  Neuro: no localizing neurological  complaints, no tingling, no weakness, no double vision, no gait abnormality, no slurred speech, no confusion  Otherwise ROS are negative except for above, 10 systems were reviewed  Past Medical History: Past Medical History  Diagnosis Date  . Coronary artery disease   . Dementia   . Diabetes mellitus without complication (HCC)   . Hyperlipidemia   . Ischemic cardiomyopathy 01/05/2015  . Biventricular implantable cardioverter-defibrillator medtronic  01/05/2015   Past Surgical History  Procedure Laterality Date  . Pacemaker insertion    . Coronary artery bypass graft       Medications: Prior to Admission medications   Medication Sig Start Date End Date Taking? Authorizing Provider  aspirin 81 MG tablet Take 81 mg by mouth daily with breakfast. Reported on 05/04/2015    Historical Provider, MD  divalproex (DEPAKOTE SPRINKLE) 125 MG capsule Take 2 capsules (250 mg total) by mouth every 8 (eight) hours. Patient not taking: Reported on 05/04/2015 01/07/15   [name], MD  fenofibrate (TRICOR) 145 MG tablet Take 145 mg by mouth at bedtime. Reported on 05/04/2015    Historical Provider, MD  furosemide (LASIX) 20 MG tablet Take 1 tablet (20 mg total) by mouth daily. Patient not taking: Reported on 05/04/2015 04/25/15   Joseph Art, DO  metoprolol tartrate (LOPRESSOR) 25 MG tablet Take 0.5 tablets (12.5 mg total) by mouth 2 (two) times daily. Patient not taking: Reported on 05/04/2015 04/25/15   Joseph Art, DO    Allergies:  No Known Allergies  Social History:  Ambulatory  wheelchair bound or bed bound Lives at home  With family     reports that he quit smoking about 51 years ago. He does not have any smokeless tobacco history on file. He reports that he does not drink alcohol.     Family History: family history includes Hypertension in his other.    Physical Exam: Patient Vitals for the past 24 hrs:  BP Temp Temp src Pulse Resp SpO2  05/04/15 1854 - - - - - 100 %    05/04/15 1850 142/82 mmHg 98.4 F (36.9 C) Oral 93 18 (!) 84 %    1. General:  in No Acute distress 2. Psychological: Alert and   Oriented to self not situation 3. Head/ENT:     Dry Mucous Membranes                          Head Non traumatic, neck supple                            Poor Dentition 4. SKIN:  decreased Skin turgor,  Skin clean Dry and intact no rash 5. Heart: Regular rate and rhythm systolic Murmur present Rub or gallop 6. Lungs: Coarse breath sounds no wheezes or crackles   7. Abdomen: Soft, non-tender, Non distended 8. Lower extremities: no clubbing, cyanosis, or edema 9. Neurologically Grossly intact, moving all 4 extremities equally, myoclonic jerks present 10. MSK: Normal range of motion  body mass index is unknown because there is no weight on file.   Labs on Admission:   Results for orders placed or performed during the hospital encounter of 05/04/15 (from the past 24 hour(s))  Urinalysis,  Routine w reflex microscopic     Status: Abnormal   Collection Time: 05/04/15  7:59 PM  Result Value Ref Range   Color, Urine AMBER (A) YELLOW   APPearance TURBID (A) CLEAR   Specific Gravity, Urine 1.012 1.005 - 1.030   pH 6.0 5.0 - 8.0   Glucose, UA NEGATIVE NEGATIVE mg/dL   Hgb urine dipstick MODERATE (A) NEGATIVE   Bilirubin Urine NEGATIVE NEGATIVE   Ketones, ur NEGATIVE NEGATIVE mg/dL   Protein, ur 161 (A) NEGATIVE mg/dL   Nitrite NEGATIVE NEGATIVE   Leukocytes, UA LARGE (A) NEGATIVE  Urine microscopic-add on     Status: Abnormal   Collection Time: 05/04/15  7:59 PM  Result Value Ref Range   Squamous Epithelial / LPF FIELD OBSCURED BY WBC'S (A) NONE SEEN   WBC, UA TOO NUMEROUS TO COUNT 0 - 5 WBC/hpf   RBC / HPF FIELD OBSCURED BY WBC'S 0 - 5 RBC/hpf   Bacteria, UA FIELD OBSCURED BY WBC'S (A) NONE SEEN  Comprehensive metabolic panel     Status: Abnormal   Collection Time: 05/04/15  8:18 PM  Result Value Ref Range   Sodium 133 (L) 135 - 145 mmol/L   Potassium  4.5 3.5 - 5.1 mmol/L   Chloride 93 (L) 101 - 111 mmol/L   CO2 28 22 - 32 mmol/L   Glucose, Bld 202 (H) 65 - 99 mg/dL   BUN 60 (H) 6 - 20 mg/dL   Creatinine, Ser 0.96 (H) 0.61 - 1.24 mg/dL   Calcium 8.8 (L) 8.9 - 10.3 mg/dL   Total Protein 6.4 (L) 6.5 - 8.1 g/dL   Albumin 2.7 (L) 3.5 - 5.0 g/dL   AST 24 15 - 41 U/L   ALT 11 (L) 17 - 63 U/L   Alkaline Phosphatase 39 38 - 126 U/L   Total Bilirubin 0.8 0.3 - 1.2 mg/dL   GFR calc non Af Amer 23 (L) >60 mL/min   GFR calc Af Amer 27 (L) >60 mL/min   Anion gap 12 5 - 15  CBC with Differential     Status: Abnormal   Collection Time: 05/04/15  8:18 PM  Result Value Ref Range   WBC 7.4 4.0 - 10.5 K/uL   RBC 3.98 (L) 4.22 - 5.81 MIL/uL   Hemoglobin 12.4 (L) 13.0 - 17.0 g/dL   HCT 04.5 (L) 40.9 - 81.1 %   MCV 95.5 78.0 - 100.0 fL   MCH 31.2 26.0 - 34.0 pg   MCHC 32.6 30.0 - 36.0 g/dL   RDW 91.4 78.2 - 95.6 %   Platelets 249 150 - 400 K/uL   Neutrophils Relative % 61 %   Neutro Abs 4.5 1.7 - 7.7 K/uL   Lymphocytes Relative 19 %   Lymphs Abs 1.4 0.7 - 4.0 K/uL   Monocytes Relative 20 %   Monocytes Absolute 1.5 (H) 0.1 - 1.0 K/uL   Eosinophils Relative 0 %   Eosinophils Absolute 0.0 0.0 - 0.7 K/uL   Basophils Relative 0 %   Basophils Absolute 0.0 0.0 - 0.1 K/uL  Troponin I     Status: Abnormal   Collection Time: 05/04/15  8:18 PM  Result Value Ref Range   Troponin I 0.06 (H) <0.031 ng/mL    UA evidence of   Elevated WBC  Lab Results  Component Value Date   HGBA1C 8.2* 01/04/2015    Estimated Creatinine Clearance: 17.8 mL/min (by C-G formula based on Cr of 2.37).  BNP (last 3 results) No  results for input(s): PROBNP in the last 8760 hours.  Other results:  I have pearsonaly reviewed this: ECG REPORT  Rate: 75  Rhythm: paced    There were no vitals filed for this visit.   Cultures:    Component Value Date/Time   SDES BLOOD LEFT ARM 04/11/2015 0050   SPECREQUEST AEROBIC BOTTLE ONLY 04/11/2015 0050   CULT NO  GROWTH 5 DAYS 04/11/2015 0050   REPTSTATUS 04/16/2015 FINAL 04/11/2015 0050     Radiological Exams on Admission: Dg Chest Portable 1 View  05/04/2015  CLINICAL DATA:  Short of breath EXAM: PORTABLE CHEST 1 VIEW COMPARISON:  04/21/2015 FINDINGS: AICD remains in good position. Negative for heart failure. Negative for pneumonia or effusion. Lungs are clear with improved aeration since prior study IMPRESSION: No active disease. Electronically Signed   By: Marlan Palau M.D.   On: 05/04/2015 19:45    Chart has been reviewed  Family not at  Bedside Spoke to nephew [name] phone [phone]    Assessment/Plan  82 year old gentleman history of coronary disease with ischemic cardiomyopathy, diabetes mellitus poorly controlled secondary to medications were discontinued due to history of dysphagia admitted with chills worsening confusion in the setting of UTI   Present on Admission:   poorly controlled diabetes with elevated blood sugar  - medications has been discontinued likely contributing to hyperglycemia, deceased be readdressed with family to discuss goals of care and if they would like to continue his diabetes medications discussed with family that adding additional medications may increase risk of choking as patient is currently on comfort died only  . Dementia chronic expect some degree of sundowning worsening confusion in the setting of urinary tract infection  . Acute on chronic renal failure (HCC) activity due to decreased by mouth intake for tonight we'll hold Lasix give a gentle fluids and monitor  . Myoclonic jerking -  restart Depakote  . UTI (lower urinary tract infection) treated Rocephin await results of urine culture . Coronary artery disease due to lipid rich plaque Arnetha Gula not endorsing any chest pain troponin likely elevated in the setting of straining. Patient not a candidate for aggressive interventions   accelerated hypertension in the setting of discontinuation of  home medications will make sure he gets Toprol otherwise permissive hypertension will give when necessary hydralazine if become severe  Prophylaxis: SCD    CODE STATUS:    DNR/DNI as per family   Disposition:  likely will need placement for rehabilitation family at this point still would like for patient to go home with hospice will need to discuss prior to discharge                             [phone] [facility] Other plan as per orders.  I have spent a total of 65 min on this admission extra time was spent to discuss case with the family    Traniyah Hallett 05/04/2015, 10:41 PM    [facility]  Pager [phone]   after 2 AM please page floor coverage PA If 7AM-7PM, please contact the day team taking care of the patient  [email or web address]  Password [email or web address]

## 2015-05-04 NOTE — ED Notes (Signed)
Hospitalist made aware

## 2015-05-05 DIAGNOSIS — E119 Type 2 diabetes mellitus without complications: Secondary | ICD-10-CM

## 2015-05-05 DIAGNOSIS — Z8679 Personal history of other diseases of the circulatory system: Secondary | ICD-10-CM

## 2015-05-05 LAB — COMPREHENSIVE METABOLIC PANEL
ALBUMIN: 2.3 g/dL — AB (ref 3.5–5.0)
ALK PHOS: 28 U/L — AB (ref 38–126)
ALT: 12 U/L — AB (ref 17–63)
AST: 22 U/L (ref 15–41)
Anion gap: 9 (ref 5–15)
BUN: 64 mg/dL — ABNORMAL HIGH (ref 6–20)
CO2: 30 mmol/L (ref 22–32)
CREATININE: 2.4 mg/dL — AB (ref 0.61–1.24)
Calcium: 8.4 mg/dL — ABNORMAL LOW (ref 8.9–10.3)
Chloride: 97 mmol/L — ABNORMAL LOW (ref 101–111)
GFR calc Af Amer: 26 mL/min — ABNORMAL LOW (ref >60)
GFR calc non Af Amer: 23 mL/min — ABNORMAL LOW (ref >60)
GLUCOSE: 113 mg/dL — AB (ref 65–99)
Potassium: 4.1 mmol/L (ref 3.5–5.1)
SODIUM: 136 mmol/L (ref 135–145)
TOTAL PROTEIN: 5.1 g/dL — AB (ref 6.5–8.1)
Total Bilirubin: 0.9 mg/dL (ref 0.3–1.2)

## 2015-05-05 LAB — CBC
HCT: 33.5 % — ABNORMAL LOW (ref 39.0–52.0)
HEMOGLOBIN: 10.8 g/dL — AB (ref 13.0–17.0)
MCH: 31.4 pg (ref 26.0–34.0)
MCHC: 32.2 g/dL (ref 30.0–36.0)
MCV: 97.4 fL (ref 78.0–100.0)
Platelets: 206 10*3/uL (ref 150–400)
RBC: 3.44 MIL/uL — ABNORMAL LOW (ref 4.22–5.81)
RDW: 13.7 % (ref 11.5–15.5)
WBC: 6.5 10*3/uL (ref 4.0–10.5)

## 2015-05-05 LAB — GLUCOSE, CAPILLARY
GLUCOSE-CAPILLARY: 60 mg/dL — AB (ref 65–99)
GLUCOSE-CAPILLARY: 71 mg/dL (ref 65–99)
Glucose-Capillary: 81 mg/dL (ref 65–99)
Glucose-Capillary: 81 mg/dL (ref 65–99)
Glucose-Capillary: 116 mg/dL — ABNORMAL HIGH (ref 65–99)
Glucose-Capillary: 61 mg/dL — ABNORMAL LOW (ref 65–99)
Glucose-Capillary: 153 mg/dL — ABNORMAL HIGH (ref 65–99)

## 2015-05-05 LAB — PHOSPHORUS: Phosphorus: 3.3 mg/dL (ref 2.5–4.6)

## 2015-05-05 LAB — MAGNESIUM: Magnesium: 2.2 mg/dL (ref 1.7–2.4)

## 2015-05-05 LAB — TSH: TSH: 3.591 u[IU]/mL (ref 0.350–4.500)

## 2015-05-05 MED ORDER — CETYLPYRIDINIUM CHLORIDE 0.05 % MT LIQD
7.0000 mL | Freq: Two times a day (BID) | OROMUCOSAL | Status: DC
  Administered 2015-05-05 – 2015-05-09 (×6): 7 mL via OROMUCOSAL

## 2015-05-05 MED ORDER — DEXTROSE 5 % IV SOLN
1.0000 g | Freq: Every day | INTRAVENOUS | Status: DC
  Administered 2015-05-05 – 2015-05-08 (×5): 1 g via INTRAVENOUS
  Filled 2015-05-05 (×6): qty 10

## 2015-05-05 MED ORDER — ACETAMINOPHEN 650 MG RE SUPP: 650.0000 mg | Freq: Four times a day (QID) | RECTAL | Status: DC | PRN

## 2015-05-05 MED ORDER — ENSURE ENLIVE PO LIQD
237.0000 mL | Freq: Two times a day (BID) | ORAL | Status: DC
  Administered 2015-05-05 – 2015-05-07 (×2): 237 mL via ORAL

## 2015-05-05 MED ORDER — INSULIN ASPART 100 UNIT/ML ~~LOC~~ SOLN: 0.0000 [IU] | Freq: Every day | SUBCUTANEOUS | Status: DC

## 2015-05-05 MED ORDER — SODIUM CHLORIDE 0.9 % IV SOLN: INTRAVENOUS | Status: DC

## 2015-05-05 MED ORDER — DIVALPROEX SODIUM 125 MG PO CSDR
250.0000 mg | DELAYED_RELEASE_CAPSULE | Freq: Three times a day (TID) | ORAL | Status: DC
  Administered 2015-05-05 – 2015-05-09 (×14): 250 mg via ORAL
  Filled 2015-05-05 (×17): qty 2

## 2015-05-05 MED ORDER — CETYLPYRIDINIUM CHLORIDE 0.05 % MT LIQD
7.0000 mL | Freq: Two times a day (BID) | OROMUCOSAL | Status: DC
  Administered 2015-05-05 – 2015-05-09 (×5): 7 mL via OROMUCOSAL

## 2015-05-05 MED ORDER — SODIUM CHLORIDE 0.9 % IV SOLN
INTRAVENOUS | Status: AC
  Administered 2015-05-05: via INTRAVENOUS

## 2015-05-05 MED ORDER — ASPIRIN EC 81 MG PO TBEC
81.0000 mg | DELAYED_RELEASE_TABLET | Freq: Every day | ORAL | Status: DC
  Administered 2015-05-05 – 2015-05-09 (×5): 81 mg via ORAL
  Filled 2015-05-05 (×5): qty 1

## 2015-05-05 MED ORDER — HYDROCODONE-ACETAMINOPHEN 5-325 MG PO TABS
1.0000 | ORAL_TABLET | ORAL | Status: DC | PRN
  Administered 2015-05-05 – 2015-05-09 (×9): 1 via ORAL
  Filled 2015-05-05 (×9): qty 1

## 2015-05-05 MED ORDER — ACETAMINOPHEN 325 MG PO TABS: 650.0000 mg | ORAL_TABLET | Freq: Four times a day (QID) | ORAL | Status: DC | PRN

## 2015-05-05 MED ORDER — SODIUM CHLORIDE 0.9 % IV BOLUS (SEPSIS)
250.0000 mL | Freq: Once | INTRAVENOUS | Status: AC
  Administered 2015-05-06: 250 mL via INTRAVENOUS

## 2015-05-05 MED ORDER — ONDANSETRON HCL 4 MG/2ML IJ SOLN: 4.0000 mg | Freq: Four times a day (QID) | INTRAMUSCULAR | Status: DC | PRN

## 2015-05-05 MED ORDER — INSULIN ASPART 100 UNIT/ML ~~LOC~~ SOLN
0.0000 [IU] | Freq: Three times a day (TID) | SUBCUTANEOUS | Status: DC
  Administered 2015-05-06: 1 [IU] via SUBCUTANEOUS

## 2015-05-05 MED ORDER — METOPROLOL TARTRATE 25 MG PO TABS
12.5000 mg | ORAL_TABLET | Freq: Two times a day (BID) | ORAL | Status: DC
  Administered 2015-05-05 – 2015-05-09 (×10): 12.5 mg via ORAL
  Filled 2015-05-05 (×10): qty 1

## 2015-05-05 MED ORDER — ONDANSETRON HCL 4 MG PO TABS: 4.0000 mg | ORAL_TABLET | Freq: Four times a day (QID) | ORAL | Status: DC | PRN

## 2015-05-05 NOTE — Progress Notes (Signed)
Palliative Medicine Team consult was received.   I stopped by to see Christopher Waller. Remains confused but denies acute complaints. He told me that his nephew, Christopher Waller, is the important person to be involved in any conversations regarding his care moving forward.  I called and spoke with Christopher Waller who reports that patient has been home with hospice support for the past couple weeks. He reports a lot of difficulty the past couple days and that his uncle has been saying "I'm going to die." He has also been very confused and has not been sleeping.  Christopher Waller reports that he will be able to come to the hospital for a family meeting tomorrow and he will call in the morning to set up time.    If there are urgent needs or questions please call [phone]. Thank you for consulting out team to assist with this patients care.  Romie Minus, MD [facility] Palliative Medicine Team [phone]

## 2015-05-05 NOTE — Progress Notes (Signed)
Patient Demographics  Christopher Waller, is a 82 y.o. male, DOB - Oct 11, 1924, [id number]  Admit date - 05/04/2015   Admitting Physician Therisa Doyne, MD  Outpatient Primary MD for the patient is No PCP Per Patient  LOS -    Chief Complaint  Patient presents with  . Weakness       Admission HPI/Brief narrative: 82 year old malewith  history of aortic stenosis,  ischemic cardiomyopathy , pulmonary hypertension and associated diastolic heart failure, dyslipidemia ,dementia, and diabetes , agent at home with home hospice , presents with fever, confusion, and jerking movements , workup significant for UTI , acute on chronic renal failure , has not been taking his Depakote for myoclonic jerks . Subjective:   Christopher Waller today has, No headache, No chest pain, No abdominal pain - No Nausea, No Cough - SOB.   Assessment & Plan    Active Problems:   Myoclonic jerking   History of ischemic cardiomyopathy   Dementia   Diabetes mellitus, stable (HCC)   Coronary artery disease due to lipid rich plaque   Acute on chronic renal failure (HCC)   UTI (lower urinary tract infection)   acute encephalopathy  - This is in the setting of dementia, with metabolic encephalopathy secondary to UTI /delirium - No focal neurological deficits    diabetes mellitus -  continue with insulin sliding scale   UTI - Continue with Rocephin, follow on urine culture.   acute on chronic renal failure stage III  - Continue to dehydration and volume depletion, continue with IV fluids , monitor closely given his history of CHF .    myoclonic jerks - Resume Depakote  History of ischemic cardiac myopathy  - LVEF 50-55% - Continue metoprolol, s/p BiV -ICD placement, 100% paced - Also with moderate TR, moderate to severe pulmonary hypertension and moderate dilatation of right ventricle with reduced systolic  function  Dementia - Home with hospice   Code Status: DO NOT RESUSCITATE   Family Communication: None at bedside   Disposition Plan: pending further workup   Procedures  None   Consults   Palliative medicine    Medications  Scheduled Meds: . antiseptic oral rinse  7 mL Mouth Rinse BID  . antiseptic oral rinse  7 mL Mouth Rinse BID  . aspirin EC  81 mg Oral Q breakfast  . cefTRIAXone (ROCEPHIN) IVPB 1 gram/50 mL D5W  1 g Intravenous QHS  . divalproex  250 mg Oral 3 times per day  . feeding supplement (ENSURE ENLIVE)  237 mL Oral BID BM  . insulin aspart  0-5 Units Subcutaneous QHS  . insulin aspart  0-9 Units Subcutaneous TID WC  . metoprolol tartrate  12.5 mg Oral BID   Continuous Infusions: . sodium chloride     PRN Meds:.acetaminophen **OR** acetaminophen, HYDROcodone-acetaminophen, ondansetron **OR** ondansetron (ZOFRAN) IV  DVT Prophylaxis  SCDs   Lab Results  Component Value Date   PLT 206 05/05/2015    Antibiotics    Anti-infectives    Start     Dose/Rate Route Frequency Ordered Stop   05/05/15 0015  cefTRIAXone (ROCEPHIN) 1 g in dextrose 5 % 50 mL IVPB     1 g 100 mL/hr over 30 Minutes Intravenous Daily at bedtime  05/05/15 0007            Objective:   Filed Vitals:   05/04/15 2300 05/04/15 2320 05/05/15 0005 05/05/15 0538  BP: 250/160 150/48  140/80  Pulse: 70 72  78  Temp:  98.3 F (36.8 C)  98.8 F (37.1 C)  TempSrc:  Axillary  Axillary  Resp: 19 20  18   Weight:   58.514 kg (129 lb)   SpO2: 100% 96%  100%    Wt Readings from Last 3 Encounters:  05/05/15 58.514 kg (129 lb)  04/25/15 58.4 kg (128 lb 12 oz)  04/14/15 67 kg (147 lb 11.3 oz)     Intake/Output Summary (Last 24 hours) at 05/05/15 1456 Last data filed at 05/05/15 0545  Gross per 24 hour  Intake  293.8 ml  Output   2400 ml  Net -2106.2 ml     Physical Exam  Awake Alert. Supple Neck,No JVD. Symmetrical Chest wall movement, Good air movement bilaterally,no  wheezing  RRR,No Gallops,Rubs , +SEM +ve B.Sounds, Abd Soft, No tenderness, No rebound - guarding or rigidity. No Cyanosis, Clubbing or edema,    Data Review   Micro Results No results found for this or any previous visit (from the past 240 hour(s)).  Radiology Reports Dg Chest 2 View  04/21/2015  CLINICAL DATA:  Acute diastolic heart failure EXAM: CHEST  2 VIEW COMPARISON:  04/20/2015 FINDINGS: Cardiomegaly again noted. Three leads cardiac pacemaker is unchanged in position. Persistent mild interstitial prominence bilateral probable mild interstitial edema. Again noted small bilateral pleural effusion with bilateral basilar atelectasis. Atherosclerotic calcifications of thoracic aorta. Osteopenia and degenerative changes thoracolumbar spine. Stable compression deformity upper lumbar spine. IMPRESSION: Three leads cardiac pacemaker is unchanged in position. Persistent mild interstitial prominence bilateral probable mild interstitial edema. Again noted small bilateral pleural effusion with bilateral basilar atelectasis. Electronically Signed   By: Natasha Mead M.D.   On: 04/21/2015 09:22   Dg Chest 2 View  04/20/2015  CLINICAL DATA:  82 year old male with wheezing, shortness of breath chest EXAM: CHEST  2 VIEW COMPARISON:  Prior chest x-ray 04/13/2015 FINDINGS: Stable position of left subclavian approach biventricular cardiac rhythm maintenance device. The leads project over the right atrium, right ventricle and overlying the left ventricle. Cardiac and mediastinal contours are unchanged. Atherosclerotic calcifications present in the transverse aorta. Patient is status post median sternotomy. Slightly increased pulmonary vascular congestion without overt edema. No pneumothorax. Small bilateral pleural effusions. Compression deformity of L1. No acute osseous abnormality. IMPRESSION: 1. Slightly increased pulmonary vascular congestion bordering on mild edema. 2. Small bilateral pleural effusions. 3.  Stable cardiomegaly. 4. Age indeterminate L1 compression fracture.  Favor remote. Electronically Signed   By: Malachy Moan M.D.   On: 04/20/2015 13:48   Ct Soft Tissue Neck W Contrast  04/20/2015  CLINICAL DATA:  82 year old diabetic male with dementia coughing or choking to the large secretions. Initial encounter. EXAM: CT NECK WITH CONTRAST TECHNIQUE: Multidetector CT imaging of the neck was performed using the standard protocol following the bolus administration of intravenous contrast. CONTRAST:  75mL OMNIPAQUE IOHEXOL 300 MG/ML  SOLN COMPARISON:  None. FINDINGS: Pharynx and larynx: Negative. Salivary glands: Negative. Thyroid: Negative. Lymph nodes: No adenopathy. Vascular: Significant atherosclerotic changes including bilateral carotid bifurcation calcification with 62% diameter stenosis proximal right internal carotid artery and 64% diameter stenosis proximal left internal carotid artery. Moderate to marked narrowing right vertebral artery and mild to moderate narrowing left vertebral artery at the level of foramen magnum. Limited intracranial:  Atrophy and small vessel disease changes. Visualized orbits: Limited evaluation and unremarkable. Mastoids and visualized paranasal sinuses: Minimal polypoid opacification inferior right maxillary sinus. Skeleton: Degenerative changes cervical spine most notable C3-4 and C5-6 with fusion C4-5. Remote anterior wedge compression deformity T2 with 50% loss height anteriorly. Upper chest: Small to slightly moderate-size left-sided and small right-sided pleural effusion with adjacent mild atelectasis. Pacemaker in place. IMPRESSION: Exam limited by motion and dental artifact without pharyngeal mass noted. Bilateral carotid bifurcation calcification with 62% diameter stenosis proximal right internal carotid artery and 64% diameter stenosis proximal left internal carotid artery. Moderate to marked narrowing right vertebral artery and mild to moderate narrowing left  vertebral artery at the level of foramen magnum. Degenerative changes cervical spine most notable C3-4 and C5-6 with fusion C4-5. Remote anterior wedge compression deformity T2 with 50% loss height anteriorly. Small to slightly moderate-size left-sided and small right-sided pleural effusion with adjacent mild atelectasis. Electronically Signed   By: Lacy Duverney M.D.   On: 04/20/2015 15:20   Dg Chest Portable 1 View  05/04/2015  CLINICAL DATA:  Short of breath EXAM: PORTABLE CHEST 1 VIEW COMPARISON:  04/21/2015 FINDINGS: AICD remains in good position. Negative for heart failure. Negative for pneumonia or effusion. Lungs are clear with improved aeration since prior study IMPRESSION: No active disease. Electronically Signed   By: Marlan Palau M.D.   On: 05/04/2015 19:45   Dg Chest Port 1 View  04/13/2015  CLINICAL DATA:  Pneumonia.  Short of breath. EXAM: PORTABLE CHEST 1 VIEW COMPARISON:  04/11/2015 FINDINGS: Heart remains mildly enlarged. Left subclavian AICD device is stable. Sternal wires are stable. Lungs under aerated and grossly clear. No pneumothorax. IMPRESSION: No active disease. Electronically Signed   By: Jolaine Click M.D.   On: 04/13/2015 10:21   Dg Chest Port 1 View  04/11/2015  CLINICAL DATA:  82 year old male with hypotension EXAM: PORTABLE CHEST 1 VIEW COMPARISON:  None. FINDINGS: Single-view of the chest does not demonstrate a focal consolidation. There is no pleural effusion or pneumothorax. Mild cardiomegaly. Left pectoral AICD device. Median sternotomy wires and CABG vascular clips. The osseous structures are grossly unremarkable with IMPRESSION: No acute cardiopulmonary process. Electronically Signed   By: Elgie Collard M.D.   On: 04/11/2015 01:36   Dg Swallowing Func-speech Pathology  04/22/2015  Objective Swallowing Evaluation:   MBS Patient Details Name: Christopher Waller MRN: [id number] Date of Birth: 1924-08-09 Today's Date: 04/22/2015 Time: SLP Start Time (ACUTE ONLY): 0818-SLP  Stop Time (ACUTE ONLY): 0837 SLP Time Calculation (min) (ACUTE ONLY): 19 min Past Medical History: Past Medical History Diagnosis Date . Coronary artery disease  . Dementia  . Diabetes mellitus without complication (HCC)  . Hyperlipidemia  . Ischemic cardiomyopathy 01/05/2015 . Biventricular implantable cardioverter-defibrillator medtronic  01/05/2015 Past Surgical History: Past Surgical History Procedure Laterality Date . Pacemaker insertion   . Coronary artery bypass graft   HPI: Pt is an 82 y.o. male with PMH of aortic stenosis, history of ischemic cardiomyopathy, significant pulmonary hypertension and associated diastolic heart failure, dyslipidemia and dementia and diabetes. Per chart, pt was discharged on 1/16 after an admission for syncope and collapse in setting of bradycardia, hypoglycemia and hypotension. Admitted 1/21 for shortness of breath and gurgling in throat. CXR 1/22 showed bilateral pleural effusions with bilateral basilar atelectasis. Pt had MBS 01/06/15 which showed mild oral, moderate-severe pharyngeal dysphagia with suspected esophageal component- premature spill with large amount of pharyngeal penetration and poor sensation, also poor laryngeal elevation, tongue base retraction,  and pharyngeal contraction. Dysphagia 3/ thin liquids were recommended. MBS recommended given history. No Data Recorded Assessment / Plan / Recommendation CHL IP CLINICAL IMPRESSIONS 04/22/2015 Therapy Diagnosis Severe pharyngeal phase dysphagia;Moderate pharyngeal phase dysphagia;Mild oral phase dysphagia Clinical Impression Pt's swallow function has deteriorated from MBS in 12/2014. Tongue base retraction, laryngeal elevation significantly decreased resulting in mod-max vallecular and pyriform sinus residue. Therapeutic strategies were unsuccessful to decrease pharyngeal residue to a safe amount and pt silently penetrated and aspirated in between and during swallows. Thin and nectar thick barium yielded similar volumes  of pharyngeal residue. Brief esophageal scan did not reveal overt abnormalities (MBS does not diagnose below level of UES). Pt has chronic dysphagia and currently is at high risk with all consistencies. Results discussed with Dr. Susie Cassette who stated she will discuss wishes for nutrition with family. Will follow up for any further education.       Impact on safety and function Severe aspiration risk   CHL IP TREATMENT RECOMMENDATION 01/06/2015 Treatment Recommendations Therapy as outlined in treatment plan below   Prognosis 01/06/2015 Prognosis for Safe Diet Advancement Good Barriers to Reach Goals Cognitive deficits;Time post onset Barriers/Prognosis Comment -- CHL IP DIET RECOMMENDATION 04/22/2015 SLP Diet Recommendations NPO Liquid Administration via -- Medication Administration -- Compensations -- Postural Changes --   CHL IP OTHER RECOMMENDATIONS 04/22/2015 Recommended Consults -- Oral Care Recommendations Oral care QID Other Recommendations --   CHL IP FOLLOW UP RECOMMENDATIONS 04/22/2015 Follow up Recommendations None   CHL IP FREQUENCY AND DURATION 01/06/2015 Speech Therapy Frequency (ACUTE ONLY) min 2x/week Treatment Duration 2 weeks      CHL IP ORAL PHASE 04/22/2015 Oral Phase Impaired Oral - Pudding Teaspoon -- Oral - Pudding Cup -- Oral - Honey Teaspoon -- Oral - Honey Cup -- Oral - Nectar Teaspoon -- Oral - Nectar Cup -- Oral - Nectar Straw -- Oral - Thin Teaspoon -- Oral - Thin Cup -- Oral - Thin Straw -- Oral - Puree -- Oral - Mech Soft -- Oral - Regular Delayed oral transit;Weak lingual manipulation Oral - Multi-Consistency -- Oral - Pill -- Oral Phase - Comment --  CHL IP PHARYNGEAL PHASE 04/22/2015 Pharyngeal Phase Impaired Pharyngeal- Pudding Teaspoon -- Pharyngeal -- Pharyngeal- Pudding Cup -- Pharyngeal -- Pharyngeal- Honey Teaspoon -- Pharyngeal -- Pharyngeal- Honey Cup -- Pharyngeal -- Pharyngeal- Nectar Teaspoon -- Pharyngeal -- Pharyngeal- Nectar Cup Pharyngeal residue - valleculae;Pharyngeal residue -  pyriform;Reduced tongue base retraction;Reduced laryngeal elevation;Penetration/Aspiration during swallow Pharyngeal Material enters airway, CONTACTS cords and not ejected out Pharyngeal- Nectar Straw -- Pharyngeal -- Pharyngeal- Thin Teaspoon Delayed swallow initiation-pyriform sinuses;Pharyngeal residue - valleculae;Pharyngeal residue - pyriform;Reduced tongue base retraction;Reduced laryngeal elevation Pharyngeal -- Pharyngeal- Thin Cup Delayed swallow initiation-pyriform sinuses;Pharyngeal residue - valleculae;Pharyngeal residue - pyriform;Reduced tongue base retraction;Reduced laryngeal elevation Pharyngeal -- Pharyngeal- Thin Straw -- Pharyngeal -- Pharyngeal- Puree Penetration/Aspiration during swallow;Pharyngeal residue - valleculae;Pharyngeal residue - pyriform;Reduced laryngeal elevation;Reduced tongue base retraction Pharyngeal Material enters airway, passes BELOW cords then ejected out;Material enters airway, remains ABOVE vocal cords and not ejected out Pharyngeal- Mechanical Soft -- Pharyngeal -- Pharyngeal- Regular Pharyngeal residue - valleculae;Pharyngeal residue - pyriform;Reduced tongue base retraction;Reduced laryngeal elevation Pharyngeal -- Pharyngeal- Multi-consistency -- Pharyngeal -- Pharyngeal- Pill -- Pharyngeal -- Pharyngeal Comment --  CHL IP CERVICAL ESOPHAGEAL PHASE 04/22/2015 Cervical Esophageal Phase WFL Pudding Teaspoon -- Pudding Cup -- Honey Teaspoon -- Honey Cup -- Nectar Teaspoon -- Nectar Cup -- Nectar Straw -- Thin Teaspoon -- Thin Cup -- Thin Straw -- Puree -- Mechanical Soft --  Regular -- Multi-consistency -- Pill -- Cervical Esophageal Comment -- CHL IP GO 04/22/2015 Functional Assessment Tool Used skilled clinical judgement Functional Limitations Swallowing Swallow Current Status (W0981) CL Swallow Goal Status (X9147) CK Swallow Discharge Status (W2956) (None) Motor Speech Current Status (O1308) (None) Motor Speech Goal Status (M5784) (None) Motor Speech Goal Status (O9629)  (None) Spoken Language Comprehension Current Status (B2841) (None) Spoken Language Comprehension Goal Status (L2440) (None) Spoken Language Comprehension Discharge Status [phone]) (None) Spoken Language Expression Current Status [phone]) (None) Spoken Language Expression Goal Status (Q0347) (None) Spoken Language Expression Discharge Status [phone]) (None) Attention Current Status (G3875) (None) Attention Goal Status (I4332) (None) Attention Discharge Status (R5188) (None) Memory Current Status (C1660) (None) Memory Goal Status (Y3016) (None) Memory Discharge Status (W1093) (None) Voice Current Status (A3557) (None) Voice Goal Status (D2202) (None) Voice Discharge Status (R4270) (None) Other Speech-Language Pathology Functional Limitation [phone]) (None) Other Speech-Language Pathology Functional Limitation Goal Status (E8315) (None) Other Speech-Language Pathology Functional Limitation Discharge Status [phone]) (None) Royce Macadamia 04/22/2015, 10:32 AM Breck Coons Lonell Face.Ed CCC-SLP Pager [phone]                CBC  Recent Labs Lab 05/04/15 2018 05/05/15 0433  WBC 7.4 6.5  HGB 12.4* 10.8*  HCT 38.0* 33.5*  PLT 249 206  MCV 95.5 97.4  MCH 31.2 31.4  MCHC 32.6 32.2  RDW 13.6 13.7  LYMPHSABS 1.4  --   MONOABS 1.5*  --   EOSABS 0.0  --   BASOSABS 0.0  --     Chemistries   Recent Labs Lab 05/04/15 2018 05/05/15 0433  NA 133* 136  K 4.5 4.1  CL 93* 97*  CO2 28 30  GLUCOSE 202* 113*  BUN 60* 64*  CREATININE 2.37* 2.40*  CALCIUM 8.8* 8.4*  MG  --  2.2  AST 24 22  ALT 11* 12*  ALKPHOS 39 28*  BILITOT 0.8 0.9   ------------------------------------------------------------------------------------------------------------------ estimated creatinine clearance is 17.6 mL/min (by C-G formula based on Cr of 2.4). ------------------------------------------------------------------------------------------------------------------ No results for input(s): HGBA1C in the last 72  hours. ------------------------------------------------------------------------------------------------------------------ No results for input(s): CHOL, HDL, LDLCALC, TRIG, CHOLHDL, LDLDIRECT in the last 72 hours. ------------------------------------------------------------------------------------------------------------------  Recent Labs  05/05/15 0433  TSH 3.591   ------------------------------------------------------------------------------------------------------------------ No results for input(s): VITAMINB12, FOLATE, FERRITIN, TIBC, IRON, RETICCTPCT in the last 72 hours.  Coagulation profile No results for input(s): INR, PROTIME in the last 168 hours.  No results for input(s): DDIMER in the last 72 hours.  Cardiac Enzymes  Recent Labs Lab 05/04/15 2018  TROPONINI 0.06*   ------------------------------------------------------------------------------------------------------------------ Invalid input(s): POCBNP     Time Spent in minutes   30 minutes   ELGERGAWY, DAWOOD M.D on 05/05/2015 at 2:56 PM  Between 7am to 7pm - Pager - [phone]  After 7pm go to [email or web address] - password [facility]  [facility]   Office  [phone]

## 2015-05-05 NOTE — Progress Notes (Signed)
Patient arrived on the unit at approximately 2339. He is alert but confused. Has noted jerky movements to upper extremities. Sacral dressing applied for protection. Unable to fully complete admission at this time as no family is present.

## 2015-05-05 NOTE — Progress Notes (Addendum)
Pt without urine output since last catheterization by previous RN. Bladder scan measured >199 ml. Pt had no urine output after bolus at 12 a.m. No distention assessed or discomfort mentioned by patient.

## 2015-05-06 DIAGNOSIS — N390 Urinary tract infection, site not specified: Secondary | ICD-10-CM | POA: Diagnosis present

## 2015-05-06 DIAGNOSIS — Z7401 Bed confinement status: Secondary | ICD-10-CM | POA: Diagnosis not present

## 2015-05-06 DIAGNOSIS — Z87891 Personal history of nicotine dependence: Secondary | ICD-10-CM | POA: Diagnosis not present

## 2015-05-06 DIAGNOSIS — I2583 Coronary atherosclerosis due to lipid rich plaque: Secondary | ICD-10-CM | POA: Diagnosis present

## 2015-05-06 DIAGNOSIS — E11649 Type 2 diabetes mellitus with hypoglycemia without coma: Secondary | ICD-10-CM | POA: Diagnosis not present

## 2015-05-06 DIAGNOSIS — Z95810 Presence of automatic (implantable) cardiac defibrillator: Secondary | ICD-10-CM | POA: Diagnosis not present

## 2015-05-06 DIAGNOSIS — Z66 Do not resuscitate: Secondary | ICD-10-CM | POA: Diagnosis present

## 2015-05-06 DIAGNOSIS — I350 Nonrheumatic aortic (valve) stenosis: Secondary | ICD-10-CM | POA: Diagnosis present

## 2015-05-06 DIAGNOSIS — R531 Weakness: Secondary | ICD-10-CM | POA: Diagnosis present

## 2015-05-06 DIAGNOSIS — Z515 Encounter for palliative care: Secondary | ICD-10-CM | POA: Diagnosis present

## 2015-05-06 DIAGNOSIS — N179 Acute kidney failure, unspecified: Secondary | ICD-10-CM | POA: Diagnosis present

## 2015-05-06 DIAGNOSIS — Z7189 Other specified counseling: Secondary | ICD-10-CM

## 2015-05-06 DIAGNOSIS — Z993 Dependence on wheelchair: Secondary | ICD-10-CM | POA: Diagnosis not present

## 2015-05-06 DIAGNOSIS — Z951 Presence of aortocoronary bypass graft: Secondary | ICD-10-CM | POA: Diagnosis not present

## 2015-05-06 DIAGNOSIS — N183 Chronic kidney disease, stage 3 (moderate): Secondary | ICD-10-CM | POA: Diagnosis present

## 2015-05-06 DIAGNOSIS — I5032 Chronic diastolic (congestive) heart failure: Secondary | ICD-10-CM | POA: Diagnosis present

## 2015-05-06 DIAGNOSIS — G253 Myoclonus: Secondary | ICD-10-CM | POA: Diagnosis present

## 2015-05-06 DIAGNOSIS — I129 Hypertensive chronic kidney disease with stage 1 through stage 4 chronic kidney disease, or unspecified chronic kidney disease: Secondary | ICD-10-CM | POA: Diagnosis present

## 2015-05-06 DIAGNOSIS — G9341 Metabolic encephalopathy: Secondary | ICD-10-CM | POA: Diagnosis present

## 2015-05-06 DIAGNOSIS — E860 Dehydration: Secondary | ICD-10-CM | POA: Diagnosis present

## 2015-05-06 DIAGNOSIS — N400 Enlarged prostate without lower urinary tract symptoms: Secondary | ICD-10-CM | POA: Diagnosis present

## 2015-05-06 DIAGNOSIS — E1165 Type 2 diabetes mellitus with hyperglycemia: Secondary | ICD-10-CM | POA: Diagnosis present

## 2015-05-06 DIAGNOSIS — Z8249 Family history of ischemic heart disease and other diseases of the circulatory system: Secondary | ICD-10-CM | POA: Diagnosis not present

## 2015-05-06 DIAGNOSIS — I272 Other secondary pulmonary hypertension: Secondary | ICD-10-CM | POA: Diagnosis present

## 2015-05-06 DIAGNOSIS — F0390 Unspecified dementia without behavioral disturbance: Secondary | ICD-10-CM | POA: Diagnosis present

## 2015-05-06 DIAGNOSIS — I2510 Atherosclerotic heart disease of native coronary artery without angina pectoris: Secondary | ICD-10-CM | POA: Diagnosis present

## 2015-05-06 DIAGNOSIS — E785 Hyperlipidemia, unspecified: Secondary | ICD-10-CM | POA: Diagnosis present

## 2015-05-06 DIAGNOSIS — I255 Ischemic cardiomyopathy: Secondary | ICD-10-CM | POA: Diagnosis present

## 2015-05-06 LAB — GLUCOSE, CAPILLARY
GLUCOSE-CAPILLARY: 45 mg/dL — AB (ref 65–99)
GLUCOSE-CAPILLARY: 84 mg/dL (ref 65–99)
GLUCOSE-CAPILLARY: 97 mg/dL (ref 65–99)
Glucose-Capillary: 96 mg/dL (ref 65–99)
Glucose-Capillary: 122 mg/dL — ABNORMAL HIGH (ref 65–99)
Glucose-Capillary: 46 mg/dL — ABNORMAL LOW (ref 65–99)

## 2015-05-06 LAB — BASIC METABOLIC PANEL
Anion gap: 8 (ref 5–15)
BUN: 60 mg/dL — AB (ref 6–20)
CHLORIDE: 99 mmol/L — AB (ref 101–111)
CO2: 27 mmol/L (ref 22–32)
CREATININE: 1.82 mg/dL — AB (ref 0.61–1.24)
Calcium: 7.8 mg/dL — ABNORMAL LOW (ref 8.9–10.3)
GFR calc Af Amer: 37 mL/min — ABNORMAL LOW (ref >60)
GFR calc non Af Amer: 32 mL/min — ABNORMAL LOW (ref >60)
Glucose, Bld: 95 mg/dL (ref 65–99)
Potassium: 4.2 mmol/L (ref 3.5–5.1)
SODIUM: 134 mmol/L — AB (ref 135–145)

## 2015-05-06 LAB — URINE CULTURE

## 2015-05-06 MED ORDER — DEXTROSE 50 % IV SOLN
INTRAVENOUS | Status: AC
  Filled 2015-05-06: qty 50

## 2015-05-06 MED ORDER — DEXTROSE 50 % IV SOLN
INTRAVENOUS | Status: AC
  Administered 2015-05-06: 25 g
  Filled 2015-05-06: qty 50

## 2015-05-06 MED ORDER — DEXTROSE-NACL 5-0.9 % IV SOLN
INTRAVENOUS | Status: DC
  Administered 2015-05-06 – 2015-05-07 (×2): 1000 mL via INTRAVENOUS
  Administered 2015-05-08: 09:00:00 via INTRAVENOUS

## 2015-05-06 MED ORDER — DEXTROSE 50 % IV SOLN
INTRAVENOUS | Status: AC
  Administered 2015-05-06: 50 mL
  Filled 2015-05-06: qty 50

## 2015-05-06 MED ORDER — TAMSULOSIN HCL 0.4 MG PO CAPS
0.4000 mg | ORAL_CAPSULE | Freq: Every day | ORAL | Status: DC
  Administered 2015-05-06 – 2015-05-08 (×3): 0.4 mg via ORAL
  Filled 2015-05-06 (×3): qty 1

## 2015-05-06 NOTE — Progress Notes (Signed)
Insertion of 60F coude cath placed. Yellow urine returned.  Christopher Waller A

## 2015-05-06 NOTE — Progress Notes (Signed)
Patient has no urine output since 12 am, baldder scan performed 3x this shift. Dr. Seth Bake. Ordered in and out cath,but unsuccessful. Md notified. Md told this nurse to insert caude cath. Urology nurse notified to insert cath. Endorsed to Cassell Clement RN.

## 2015-05-06 NOTE — Progress Notes (Signed)
   05/06/15 1200  Clinical Encounter Type  Visited With Patient  Visit Type Initial;Psychological support;Spiritual support  Referral From Chaplain  Consult/Referral To Chaplain  Spiritual Encounters  Spiritual Needs Emotional;Other (Comment) (Pastoral Support)  Stress Factors  Patient Stress Factors Not reviewed;Other (Comment) (Not able to review/Stated back pain)   Patient was lying in bed upon my arrival and yelling that he was in pain. Nurses came in to assist him.  Patient stated that he was having back pain. Patient wasn't completely coherent and did not seem to understand what a Chaplain is. I provided him with a neck pillow.  Please contact Spiritual Care for further assistance.    Chaplain Clint Bolder M.Div.

## 2015-05-06 NOTE — Progress Notes (Signed)
Dr. Seth Bake aware of results of bladder scan performed this am. Will continue to monitor.

## 2015-05-06 NOTE — Progress Notes (Signed)
Repositioned to Right,Bed alarm on

## 2015-05-06 NOTE — Progress Notes (Signed)
Patient Demographics  Christopher Waller, is a 82 y.o. male, DOB - 1925-02-14, NWG:[id number]  Admit date - 05/04/2015   Admitting Physician Therisa Doyne, MD  Outpatient Primary MD for the patient is No PCP Per Patient  LOS -    Chief Complaint  Patient presents with  . Weakness       Admission HPI/Brief narrative: 82 year old malewith  history of aortic stenosis,  ischemic cardiomyopathy , pulmonary hypertension and associated diastolic heart failure, dyslipidemia ,dementia, and diabetes , agent at home with home hospice , presents with fever, confusion, and jerking movements , workup significant for UTI , acute on chronic renal failure , has not been taking his Depakote for myoclonic jerks . Subjective:   [employer] today has, No headache, No chest pain, No abdominal pain - No Nausea, No Cough - SOB.   Assessment & Plan    Active Problems:   Myoclonic jerking   History of ischemic cardiomyopathy   Dementia   Diabetes mellitus, stable (HCC)   Coronary artery disease due to lipid rich plaque   Acute on chronic renal failure (HCC)   UTI (lower urinary tract infection)   acute encephalopathy  - This is in the setting of dementia, with metabolic encephalopathy secondary to UTI /delirium - No focal neurological deficits    diabetes mellitus -  On  insulin sliding scale, with an episode of hypoglycemia this a.m.Marland Kitchenwill continue sliding scale, and continue with CBG monitoring .   UTI - Continue with Rocephin, follow on urine culture. - Continue to monitor closely for urinary retention, required one time in and out yesterday   acute on chronic renal failure stage III  - Due to  dehydration and volume depletion, , Baseline creatinine 1.5, on admission 2.4, improving with IV fluids, today is 1.8  ,continue with IV fluids , monitor closely given his history of CHF .    myoclonic jerks -  Resume Depakote  History of ischemic cardiac myopathy  - LVEF 50-55% - Continue metoprolol, s/p BiV -ICD placement, 100% paced - Also with moderate TR, moderate to severe pulmonary hypertension and moderate dilatation of right ventricle with reduced systolic function  Dementia - Home with hospice   Code Status: DO NOT RESUSCITATE   Family Communication: None at bedside   Disposition Plan: back to home with hospice in 24 hours.   Procedures  None   Consults   Palliative medicine    Medications  Scheduled Meds: . antiseptic oral rinse  7 mL Mouth Rinse BID  . antiseptic oral rinse  7 mL Mouth Rinse BID  . aspirin EC  81 mg Oral Q breakfast  . cefTRIAXone (ROCEPHIN) IVPB 1 gram/50 mL D5W  1 g Intravenous QHS  . dextrose      . divalproex  250 mg Oral 3 times per day  . feeding supplement (ENSURE ENLIVE)  237 mL Oral BID BM  . insulin aspart  0-5 Units Subcutaneous QHS  . insulin aspart  0-9 Units Subcutaneous TID WC  . metoprolol tartrate  12.5 mg Oral BID   Continuous Infusions: . sodium chloride 75 mL/hr at 05/05/15 1500   PRN Meds:.acetaminophen **OR** acetaminophen, HYDROcodone-acetaminophen, ondansetron **OR** ondansetron (ZOFRAN) IV  DVT Prophylaxis  SCDs   Lab  Results  Component Value Date   PLT 206 05/05/2015    Antibiotics    Anti-infectives    Start     Dose/Rate Route Frequency Ordered Stop   05/05/15 0015  cefTRIAXone (ROCEPHIN) 1 g in dextrose 5 % 50 mL IVPB     1 g 100 mL/hr over 30 Minutes Intravenous Daily at bedtime 05/05/15 0007            Objective:   Filed Vitals:   05/05/15 2001 05/06/15 0419 05/06/15 1015 05/06/15 1357  BP: 103/75 139/99 119/94 134/86  Pulse: 74 75 78 93  Temp: 98 F (36.7 C) 98.3 F (36.8 C)  97.8 F (36.6 C)  TempSrc: Axillary Axillary  Axillary  Resp: 16 16  20   Weight:      SpO2: 92% 100%  97%    Wt Readings from Last 3 Encounters:  05/05/15 58.514 kg (129 lb)  04/25/15 58.4 kg (128 lb 12 oz)   04/14/15 67 kg (147 lb 11.3 oz)     Intake/Output Summary (Last 24 hours) at 05/06/15 1432 Last data filed at 05/06/15 0854  Gross per 24 hour  Intake    600 ml  Output    250 ml  Net    350 ml     Physical Exam  Awake Alert. Supple Neck,No JVD. Symmetrical Chest wall movement, Good air movement bilaterally,no wheezing  RRR,No Gallops,Rubs , +SEM +ve B.Sounds, Abd Soft, No tenderness, No rebound - guarding or rigidity. No Cyanosis, Clubbing or edema,    Data Review   Micro Results Recent Results (from the past 240 hour(s))  Urine culture     Status: None   Collection Time: 05/04/15  7:59 PM  Result Value Ref Range Status   Specimen Description URINE, CATHETERIZED  Final   Special Requests NONE  Final   Culture   Final    MULTIPLE SPECIES PRESENT, SUGGEST RECOLLECTION Performed at [facility]    Report Status 05/06/2015 FINAL  Final    Radiology Reports Dg Chest 2 View  04/21/2015  CLINICAL DATA:  Acute diastolic heart failure EXAM: CHEST  2 VIEW COMPARISON:  04/20/2015 FINDINGS: Cardiomegaly again noted. Three leads cardiac pacemaker is unchanged in position. Persistent mild interstitial prominence bilateral probable mild interstitial edema. Again noted small bilateral pleural effusion with bilateral basilar atelectasis. Atherosclerotic calcifications of thoracic aorta. Osteopenia and degenerative changes thoracolumbar spine. Stable compression deformity upper lumbar spine. IMPRESSION: Three leads cardiac pacemaker is unchanged in position. Persistent mild interstitial prominence bilateral probable mild interstitial edema. Again noted small bilateral pleural effusion with bilateral basilar atelectasis. Electronically Signed   By: Natasha Mead M.D.   On: 04/21/2015 09:22   Dg Chest 2 View  04/20/2015  CLINICAL DATA:  82 year old male with wheezing, shortness of breath chest EXAM: CHEST  2 VIEW COMPARISON:  Prior chest x-ray 04/13/2015 FINDINGS: Stable position of  left subclavian approach biventricular cardiac rhythm maintenance device. The leads project over the right atrium, right ventricle and overlying the left ventricle. Cardiac and mediastinal contours are unchanged. Atherosclerotic calcifications present in the transverse aorta. Patient is status post median sternotomy. Slightly increased pulmonary vascular congestion without overt edema. No pneumothorax. Small bilateral pleural effusions. Compression deformity of L1. No acute osseous abnormality. IMPRESSION: 1. Slightly increased pulmonary vascular congestion bordering on mild edema. 2. Small bilateral pleural effusions. 3. Stable cardiomegaly. 4. Age indeterminate L1 compression fracture.  Favor remote. Electronically Signed   By: Malachy Moan M.D.   On: 04/20/2015 13:48  Ct Soft Tissue Neck W Contrast  04/20/2015  CLINICAL DATA:  82 year old diabetic male with dementia coughing or choking to the large secretions. Initial encounter. EXAM: CT NECK WITH CONTRAST TECHNIQUE: Multidetector CT imaging of the neck was performed using the standard protocol following the bolus administration of intravenous contrast. CONTRAST:  75mL OMNIPAQUE IOHEXOL 300 MG/ML  SOLN COMPARISON:  None. FINDINGS: Pharynx and larynx: Negative. Salivary glands: Negative. Thyroid: Negative. Lymph nodes: No adenopathy. Vascular: Significant atherosclerotic changes including bilateral carotid bifurcation calcification with 62% diameter stenosis proximal right internal carotid artery and 64% diameter stenosis proximal left internal carotid artery. Moderate to marked narrowing right vertebral artery and mild to moderate narrowing left vertebral artery at the level of foramen magnum. Limited intracranial: Atrophy and small vessel disease changes. Visualized orbits: Limited evaluation and unremarkable. Mastoids and visualized paranasal sinuses: Minimal polypoid opacification inferior right maxillary sinus. Skeleton: Degenerative changes cervical  spine most notable C3-4 and C5-6 with fusion C4-5. Remote anterior wedge compression deformity T2 with 50% loss height anteriorly. Upper chest: Small to slightly moderate-size left-sided and small right-sided pleural effusion with adjacent mild atelectasis. Pacemaker in place. IMPRESSION: Exam limited by motion and dental artifact without pharyngeal mass noted. Bilateral carotid bifurcation calcification with 62% diameter stenosis proximal right internal carotid artery and 64% diameter stenosis proximal left internal carotid artery. Moderate to marked narrowing right vertebral artery and mild to moderate narrowing left vertebral artery at the level of foramen magnum. Degenerative changes cervical spine most notable C3-4 and C5-6 with fusion C4-5. Remote anterior wedge compression deformity T2 with 50% loss height anteriorly. Small to slightly moderate-size left-sided and small right-sided pleural effusion with adjacent mild atelectasis. Electronically Signed   By: Lacy Duverney M.D.   On: 04/20/2015 15:20   Dg Chest Portable 1 View  05/04/2015  CLINICAL DATA:  Short of breath EXAM: PORTABLE CHEST 1 VIEW COMPARISON:  04/21/2015 FINDINGS: AICD remains in good position. Negative for heart failure. Negative for pneumonia or effusion. Lungs are clear with improved aeration since prior study IMPRESSION: No active disease. Electronically Signed   By: Marlan Palau M.D.   On: 05/04/2015 19:45   Dg Chest Port 1 View  04/13/2015  CLINICAL DATA:  Pneumonia.  Short of breath. EXAM: PORTABLE CHEST 1 VIEW COMPARISON:  04/11/2015 FINDINGS: Heart remains mildly enlarged. Left subclavian AICD device is stable. Sternal wires are stable. Lungs under aerated and grossly clear. No pneumothorax. IMPRESSION: No active disease. Electronically Signed   By: Jolaine Click M.D.   On: 04/13/2015 10:21   Dg Chest Port 1 View  04/11/2015  CLINICAL DATA:  82 year old male with hypotension EXAM: PORTABLE CHEST 1 VIEW COMPARISON:  None.  FINDINGS: Single-view of the chest does not demonstrate a focal consolidation. There is no pleural effusion or pneumothorax. Mild cardiomegaly. Left pectoral AICD device. Median sternotomy wires and CABG vascular clips. The osseous structures are grossly unremarkable with IMPRESSION: No acute cardiopulmonary process. Electronically Signed   By: Elgie Collard M.D.   On: 04/11/2015 01:36   Dg Swallowing Func-speech Pathology  04/22/2015  Objective Swallowing Evaluation:   MBS Patient Details Name: Silvester Reierson MRN: [id number] Date of Birth: Oct 08, 1924 Today's Date: 04/22/2015 Time: SLP Start Time (ACUTE ONLY): 0818-SLP Stop Time (ACUTE ONLY): 0837 SLP Time Calculation (min) (ACUTE ONLY): 19 min Past Medical History: Past Medical History Diagnosis Date . Coronary artery disease  . Dementia  . Diabetes mellitus without complication (HCC)  . Hyperlipidemia  . Ischemic cardiomyopathy 01/05/2015 . Biventricular implantable cardioverter-defibrillator  medtronic  01/05/2015 Past Surgical History: Past Surgical History Procedure Laterality Date . Pacemaker insertion   . Coronary artery bypass graft   HPI: Pt is an 82 y.o. male with PMH of aortic stenosis, history of ischemic cardiomyopathy, significant pulmonary hypertension and associated diastolic heart failure, dyslipidemia and dementia and diabetes. Per chart, pt was discharged on 1/16 after an admission for syncope and collapse in setting of bradycardia, hypoglycemia and hypotension. Admitted 1/21 for shortness of breath and gurgling in throat. CXR 1/22 showed bilateral pleural effusions with bilateral basilar atelectasis. Pt had MBS 01/06/15 which showed mild oral, moderate-severe pharyngeal dysphagia with suspected esophageal component- premature spill with large amount of pharyngeal penetration and poor sensation, also poor laryngeal elevation, tongue base retraction, and pharyngeal contraction. Dysphagia 3/ thin liquids were recommended. MBS recommended given  history. No Data Recorded Assessment / Plan / Recommendation CHL IP CLINICAL IMPRESSIONS 04/22/2015 Therapy Diagnosis Severe pharyngeal phase dysphagia;Moderate pharyngeal phase dysphagia;Mild oral phase dysphagia Clinical Impression Pt's swallow function has deteriorated from MBS in 12/2014. Tongue base retraction, laryngeal elevation significantly decreased resulting in mod-max vallecular and pyriform sinus residue. Therapeutic strategies were unsuccessful to decrease pharyngeal residue to a safe amount and pt silently penetrated and aspirated in between and during swallows. Thin and nectar thick barium yielded similar volumes of pharyngeal residue. Brief esophageal scan did not reveal overt abnormalities (MBS does not diagnose below level of UES). Pt has chronic dysphagia and currently is at high risk with all consistencies. Results discussed with Dr. Susie Cassette who stated she will discuss wishes for nutrition with family. Will follow up for any further education.       Impact on safety and function Severe aspiration risk   CHL IP TREATMENT RECOMMENDATION 01/06/2015 Treatment Recommendations Therapy as outlined in treatment plan below   Prognosis 01/06/2015 Prognosis for Safe Diet Advancement Good Barriers to Reach Goals Cognitive deficits;Time post onset Barriers/Prognosis Comment -- CHL IP DIET RECOMMENDATION 04/22/2015 SLP Diet Recommendations NPO Liquid Administration via -- Medication Administration -- Compensations -- Postural Changes --   CHL IP OTHER RECOMMENDATIONS 04/22/2015 Recommended Consults -- Oral Care Recommendations Oral care QID Other Recommendations --   CHL IP FOLLOW UP RECOMMENDATIONS 04/22/2015 Follow up Recommendations None   CHL IP FREQUENCY AND DURATION 01/06/2015 Speech Therapy Frequency (ACUTE ONLY) min 2x/week Treatment Duration 2 weeks      CHL IP ORAL PHASE 04/22/2015 Oral Phase Impaired Oral - Pudding Teaspoon -- Oral - Pudding Cup -- Oral - Honey Teaspoon -- Oral - Honey Cup -- Oral - Nectar  Teaspoon -- Oral - Nectar Cup -- Oral - Nectar Straw -- Oral - Thin Teaspoon -- Oral - Thin Cup -- Oral - Thin Straw -- Oral - Puree -- Oral - Mech Soft -- Oral - Regular Delayed oral transit;Weak lingual manipulation Oral - Multi-Consistency -- Oral - Pill -- Oral Phase - Comment --  CHL IP PHARYNGEAL PHASE 04/22/2015 Pharyngeal Phase Impaired Pharyngeal- Pudding Teaspoon -- Pharyngeal -- Pharyngeal- Pudding Cup -- Pharyngeal -- Pharyngeal- Honey Teaspoon -- Pharyngeal -- Pharyngeal- Honey Cup -- Pharyngeal -- Pharyngeal- Nectar Teaspoon -- Pharyngeal -- Pharyngeal- Nectar Cup Pharyngeal residue - valleculae;Pharyngeal residue - pyriform;Reduced tongue base retraction;Reduced laryngeal elevation;Penetration/Aspiration during swallow Pharyngeal Material enters airway, CONTACTS cords and not ejected out Pharyngeal- Nectar Straw -- Pharyngeal -- Pharyngeal- Thin Teaspoon Delayed swallow initiation-pyriform sinuses;Pharyngeal residue - valleculae;Pharyngeal residue - pyriform;Reduced tongue base retraction;Reduced laryngeal elevation Pharyngeal -- Pharyngeal- Thin Cup Delayed swallow initiation-pyriform sinuses;Pharyngeal residue - valleculae;Pharyngeal residue - pyriform;Reduced tongue base  retraction;Reduced laryngeal elevation Pharyngeal -- Pharyngeal- Thin Straw -- Pharyngeal -- Pharyngeal- Puree Penetration/Aspiration during swallow;Pharyngeal residue - valleculae;Pharyngeal residue - pyriform;Reduced laryngeal elevation;Reduced tongue base retraction Pharyngeal Material enters airway, passes BELOW cords then ejected out;Material enters airway, remains ABOVE vocal cords and not ejected out Pharyngeal- Mechanical Soft -- Pharyngeal -- Pharyngeal- Regular Pharyngeal residue - valleculae;Pharyngeal residue - pyriform;Reduced tongue base retraction;Reduced laryngeal elevation Pharyngeal -- Pharyngeal- Multi-consistency -- Pharyngeal -- Pharyngeal- Pill -- Pharyngeal -- Pharyngeal Comment --  CHL IP CERVICAL ESOPHAGEAL  PHASE 04/22/2015 Cervical Esophageal Phase WFL Pudding Teaspoon -- Pudding Cup -- Honey Teaspoon -- Honey Cup -- Nectar Teaspoon -- Nectar Cup -- Nectar Straw -- Thin Teaspoon -- Thin Cup -- Thin Straw -- Puree -- Mechanical Soft -- Regular -- Multi-consistency -- Pill -- Cervical Esophageal Comment -- CHL IP GO 04/22/2015 Functional Assessment Tool Used skilled clinical judgement Functional Limitations Swallowing Swallow Current Status (Z6109) CL Swallow Goal Status (U0454) CK Swallow Discharge Status (U9811) (None) Motor Speech Current Status (B1478) (None) Motor Speech Goal Status (G9562) (None) Motor Speech Goal Status (Z3086) (None) Spoken Language Comprehension Current Status (V7846) (None) Spoken Language Comprehension Goal Status (N6295) (None) Spoken Language Comprehension Discharge Status (M8413) (None) Spoken Language Expression Current Status (K4401) (None) Spoken Language Expression Goal Status (U2725) (None) Spoken Language Expression Discharge Status [phone]) (None) Attention Current Status (I3474) (None) Attention Goal Status (Q5956) (None) Attention Discharge Status (L8756) (None) Memory Current Status (E3329) (None) Memory Goal Status (J1884) (None) Memory Discharge Status (Z6606) (None) Voice Current Status (T0160) (None) Voice Goal Status (F0932) (None) Voice Discharge Status (T5573) (None) Other Speech-Language Pathology Functional Limitation [phone]) (None) Other Speech-Language Pathology Functional Limitation Goal Status (K2706) (None) Other Speech-Language Pathology Functional Limitation Discharge Status [phone]) (None) Royce Macadamia 04/22/2015, 10:32 AM Breck Coons Lonell Face.Ed CCC-SLP Pager [phone]                CBC  Recent Labs Lab 05/04/15 2018 05/05/15 0433  WBC 7.4 6.5  HGB 12.4* 10.8*  HCT 38.0* 33.5*  PLT 249 206  MCV 95.5 97.4  MCH 31.2 31.4  MCHC 32.6 32.2  RDW 13.6 13.7  LYMPHSABS 1.4  --   MONOABS 1.5*  --   EOSABS 0.0  --   BASOSABS 0.0  --      Chemistries   Recent Labs Lab 05/04/15 2018 05/05/15 0433 05/06/15 0434  NA 133* 136 134*  K 4.5 4.1 4.2  CL 93* 97* 99*  CO2 28 30 27   GLUCOSE 202* 113* 95  BUN 60* 64* 60*  CREATININE 2.37* 2.40* 1.82*  CALCIUM 8.8* 8.4* 7.8*  MG  --  2.2  --   AST 24 22  --   ALT 11* 12*  --   ALKPHOS 39 28*  --   BILITOT 0.8 0.9  --    ------------------------------------------------------------------------------------------------------------------ estimated creatinine clearance is 23.2 mL/min (by C-G formula based on Cr of 1.82). ------------------------------------------------------------------------------------------------------------------ No results for input(s): HGBA1C in the last 72 hours. ------------------------------------------------------------------------------------------------------------------ No results for input(s): CHOL, HDL, LDLCALC, TRIG, CHOLHDL, LDLDIRECT in the last 72 hours. ------------------------------------------------------------------------------------------------------------------  Recent Labs  05/05/15 0433  TSH 3.591   ------------------------------------------------------------------------------------------------------------------ No results for input(s): VITAMINB12, FOLATE, FERRITIN, TIBC, IRON, RETICCTPCT in the last 72 hours.  Coagulation profile No results for input(s): INR, PROTIME in the last 168 hours.  No results for input(s): DDIMER in the last 72 hours.  Cardiac Enzymes  Recent Labs Lab 05/04/15 2018  TROPONINI 0.06*   ------------------------------------------------------------------------------------------------------------------ Invalid input(s): POCBNP  Time Spent in minutes   30 minutes   Merdis Snodgrass M.D on 05/06/2015 at 2:32 PM  Between 7am to 7pm - Pager - [phone]  After 7pm go to [email or web address] - password [email or web address]  [facility]   Office  [phone]

## 2015-05-06 NOTE — Progress Notes (Signed)
Bladder scan performed this am,noted >200 ml, patient denies bladder pressure. No distention noted. Will continue to monitor.

## 2015-05-06 NOTE — Progress Notes (Signed)
CBG up to ,MD aware. Will continue to monitor.

## 2015-05-06 NOTE — Progress Notes (Signed)
Bladder scan performed at this time,results was >200,no abdominal distention. Dr. Seth Bake notified.Will continue to monitor.

## 2015-05-06 NOTE — Progress Notes (Signed)
CRITICAL VALUE ALERT  Critical value received:  CBG 45  Date of notification:  05/06/15  Time of notification:  0827  Critical value read back:Yes.    Nurse who received alert:  Hulda Marin  MD notified (1st page):  Dr. Seth Bake  Time of first page:  [phone]  MD notified (2nd page):  Time of second page:  Responding MD:  Dr. Seth Bake  Time MD responded:  Dr. Verdie Shire

## 2015-05-07 ENCOUNTER — Encounter: Payer: Medicare Other | Admitting: Physician Assistant

## 2015-05-07 LAB — GLUCOSE, CAPILLARY
GLUCOSE-CAPILLARY: 93 mg/dL (ref 65–99)
GLUCOSE-CAPILLARY: 95 mg/dL (ref 65–99)
GLUCOSE-CAPILLARY: 178 mg/dL — AB (ref 65–99)
GLUCOSE-CAPILLARY: 264 mg/dL — AB (ref 65–99)
Glucose-Capillary: 100 mg/dL — ABNORMAL HIGH (ref 65–99)
Glucose-Capillary: 82 mg/dL (ref 65–99)
Glucose-Capillary: 85 mg/dL (ref 65–99)
Glucose-Capillary: 89 mg/dL (ref 65–99)

## 2015-05-07 LAB — BASIC METABOLIC PANEL
Anion gap: 9 (ref 5–15)
BUN: 49 mg/dL — AB (ref 6–20)
CHLORIDE: 102 mmol/L (ref 101–111)
CO2: 25 mmol/L (ref 22–32)
Calcium: 8 mg/dL — ABNORMAL LOW (ref 8.9–10.3)
Creatinine, Ser: 1.26 mg/dL — ABNORMAL HIGH (ref 0.61–1.24)
GFR calc non Af Amer: 49 mL/min — ABNORMAL LOW (ref >60)
GFR, EST AFRICAN AMERICAN: 57 mL/min — AB (ref >60)
Glucose, Bld: 118 mg/dL — ABNORMAL HIGH (ref 65–99)
POTASSIUM: 4.9 mmol/L (ref 3.5–5.1)
SODIUM: 136 mmol/L (ref 135–145)

## 2015-05-07 MED ORDER — POLYVINYL ALCOHOL 1.4 % OP SOLN
1.0000 [drp] | OPHTHALMIC | Status: DC | PRN
  Filled 2015-05-07: qty 15

## 2015-05-07 MED ORDER — ENSURE ENLIVE PO LIQD
237.0000 mL | Freq: Three times a day (TID) | ORAL | Status: DC
  Administered 2015-05-07 – 2015-05-09 (×7): 237 mL via ORAL

## 2015-05-07 MED ORDER — MEGESTROL ACETATE 400 MG/10ML PO SUSP
400.0000 mg | Freq: Two times a day (BID) | ORAL | Status: DC
  Administered 2015-05-07 – 2015-05-09 (×4): 400 mg via ORAL
  Filled 2015-05-07 (×5): qty 10

## 2015-05-07 NOTE — Progress Notes (Signed)
Initial Nutrition Assessment  DOCUMENTATION CODES:   Severe malnutrition in context of chronic illness  INTERVENTION:   -Continue Ensure Enlive po TID, each supplement provides 350 kcal and 20 grams of protein. -Recommend feeding assistance.  NUTRITION DIAGNOSIS:   Malnutrition related to chronic illness as evidenced by percent weight loss, moderate depletion of body fat, severe depletion of muscle mass.  GOAL:   Patient will meet greater than or equal to 90% of their needs  MONITOR:   PO intake, Supplement acceptance, Labs, Weight trends, Skin, GOC  REASON FOR ASSESSMENT:   Malnutrition Screening Tool   ASSESSMENT:   82 year old male withhistory of aortic stenosis, ischemic cardiomyopathy, pulmonary hypertension and associated diastolic heart failure, dyslipidemia, dementia, and diabetes, agent at home with home hospice, presents with fever, confusion, and jerking movements. Workup significant for UTI , acute on chronic renal failure.   Pt asleep at time of visit.  Nurse tech was in the room.  She stated that patient asks for food throughout the morning, but once food arrives he will not eat.  Nurse tech tried to feed pt some pork chop but he spit it out.  For breakfast, she reports that she coaxed him to eat about 30% of his tray.  She reports that he tends to eat the meats, if anything, off the tray.  Per chart review, patient has a meal completion of 0 - 30%.   Spoke with nurse who stated that patient has had very poor po's.  She reports the he did drink a little ensure, about half a bottle. States that GOC are yet to be determined.  Noted per chart review, Palliative following pt for GOC.  Was at home with hospice PTA.  Nutrition Focused Physical Exam conducted.  Findings include moderate fat depletion and severe muscle wasting of the upper extremities. Patient has had a significant weight loss of 12% over the past 6 weeks.   Meds reviewed.  Labs reviewed: elevated glucose  (178).  Diet Order:  DIET DYS 3 Room service appropriate?: Yes; Fluid consistency:: Thin  Skin:  Wound (see comment) (Stage II PU, R buttocks)  Last BM:  05/06/15  Height:   Ht Readings from Last 1 Encounters:  04/20/15  (1.702 m)   Weight:   Wt Readings from Last 1 Encounters:  05/05/15 129 lb (58.514 kg)   Ideal Body Weight:  67.3 kg  BMI:  Body mass index is 20.2 kg/(m^2).  Estimated Nutritional Needs:   Kcal:  1600-1800  Protein:  75-85 grams  Fluid:  1.6 - 1.8 L  EDUCATION NEEDS:   No education needs identified at this time  Doroteo Glassman, Dietetic Intern Pager: [phone]

## 2015-05-07 NOTE — Clinical Social Work Note (Signed)
Clinical Social Work Assessment  Patient Details  Name: Christopher Waller MRN: [id number] Date of Birth: 1924/09/06  Date of referral:  05/07/15               Reason for consult:  Facility Placement                Permission sought to share information with:  Family Supports Permission granted to share information::  Yes, Verbal Permission Granted  Name::     Christopher Waller  Agency::     Relationship::  nephew  Contact Information:  [phone]  Housing/Transportation Living arrangements for the past 2 months:  Single Family Home Source of Information:  Other (Comment Required) (patient's nephew) Patient Interpreter Needed:  None Criminal Activity/Legal Involvement Pertinent to Current Situation/Hospitalization:  No - Comment as needed Significant Relationships:  Other(Comment) (pt nephew) Lives with:  Relatives Do you feel safe going back to the place where you live?  No Need for family participation in patient care:  Yes (Comment)  Care giving concerns:  Pt admitted from home with pt nephew and his wife. Per report, pt had hospice at home, but pt nephew does not feel they can care for pt at home and that hospice has not worked out as he had anticipated. Pt nephew wanting to explore options. PT at this time recommending No PT follow up stating pt unable to participate in PT.    Social Worker assessment / plan:    Patient is an 82 year old male who lives with his nephew and his wife.Patient has some dementia and oriented to person only. CSW spoke to patient's nephew Christopher Waller [phone] to discuss disposition planning. PT recommendation states No PT follow up and pt unable to participate in PT. CSW discussed with pt nephew regarding disposition planning. CSW explained to pt nephew that PT evaluation states that pt needs No PT follow up as pt unable to participate. Pt nephew expressed that he disagrees with this and does not feel that today was a good day to evaluate pt. CSW explained  that CSW can request PT re-evaluation, but if pt is unable to participate in rehab then Medicare will likely not cover SNF placement. Pt nephew displayed frustration with this explanation and states that he "sees people at the end of their lives in facilities all of the time." CSW explained that long term patients at SNF are at [facility] under Medicaid or pt family is privately paying for the long term care. Pt nephew expressed that he feels that pt would be able to do something in rehab and CSW explained that CSW can send pt information to SNF to see if a SNF would be willing to accept pt to try rehab, but CSW cannot guarantee that if pt can get into rehab that Medicare will provide coverage for any length of time. Pt nephew request SNF search and PT re-evaluation. CSW also discussed with pt nephew that RNCM spoke with [facility] who stated there was respite SNF care available and CSW offered option of private pay at [facility] if Medicare will not cover. Pt nephew feels SNF should be covered by Medicare and that has been at rehab in the past and had speech therapy.  CSW completed FL2 and sent clinical information to [facility].   CSW notified MD that pt nephew requesting PT re-evaluation.   CSW to follow up with pt nephew tomorrow to continue to assist with pt discharge planning needs.   Employment status:  Retired Community education officer  information:  Medicare PT Recommendations:  No Follow Up (initial PT evaluation states No PT f/u as pt unable to participate) Information / Referral to community resources:  Skilled Nursing Facility  Patient/Family's Response to care:  Pt alert and oriented to person only. Pt nephew concerned about continuing to care for pt at home, but not receptive to CSW discussing that if pt goes to a SNF then Medicare will likely not cover for long as pt nephew feels pt is able to rehab.   Patient/Family's Understanding of and Emotional Response to Diagnosis, Current Treatment, and  Prognosis:  Pt nephew discusses pt medical needs, but frustrated with the information CSW discussed regarding insurance coverage for SNF.   Emotional Assessment Appearance:  Appears stated age Attitude/Demeanor/Rapport:  Unable to Assess Affect (typically observed):  Unable to Assess Orientation:  Oriented to Self Alcohol / Substance use:  Not Applicable Psych involvement (Current and /or in the community):  No (Comment)  Discharge Needs  Concerns to be addressed:  Discharge Planning Concerns Readmission within the last 30 days:  No Current discharge risk:  Lack of support system, Dependent with Mobility Barriers to Discharge:  Continued Medical Work up   Christopher Eva, LCSW 05/07/2015, 4:18 PM  [phone]

## 2015-05-07 NOTE — Clinical Social Work Placement (Signed)
   CLINICAL SOCIAL WORK PLACEMENT  NOTE  Date:  05/07/2015  Patient Details  Name: Christopher Waller MRN: [id number] Date of Birth: 01/01/1925  Clinical Social Work is seeking post-discharge placement for this patient at the Skilled  Nursing Facility level of care (*CSW will initial, date and re-position this form in  chart as items are completed):  Yes   Patient/family provided with [facility] Clinical Social Work Department's list of facilities offering this level of care within the geographic area requested by the patient (or if unable, by the patient's family).  Yes   Patient/family informed of their freedom to choose among providers that offer the needed level of care, that participate in Medicare, Medicaid or managed care program needed by the patient, have an available bed and are willing to accept the patient.  Yes   Patient/family informed of [facility]'s ownership interest in [facility] and [facility], as well as of the fact that they are under no obligation to receive care at these facilities.  PASRR submitted to EDS on       PASRR number received on       Existing PASRR number confirmed on 05/07/15     FL2 transmitted to all facilities in geographic area requested by pt/family on 05/07/15     FL2 transmitted to all facilities within larger geographic area on       Patient informed that his/her managed care company has contracts with or will negotiate with certain facilities, including the following:            Patient/family informed of bed offers received.  Patient chooses bed at       Physician recommends and patient chooses bed at      Patient to be transferred to   on  .  Patient to be transferred to facility by       Patient family notified on   of transfer.  Name of family member notified:        PHYSICIAN Please sign FL2, Please sign DNR     Additional Comment:    _______________________________________________ [name],  LCSW 05/07/2015, 4:40 PM

## 2015-05-07 NOTE — Progress Notes (Addendum)
Daily Progress Note   Patient Name: Christopher Waller       Date: 05/07/2015 DOB: May 17, 1924  Age: 82 y.o. MRN#: [id number] Attending Physician: Starleen Arms, MD Primary Care Physician: No PCP Per Patient Admit Date: 05/04/2015  Reason for Consultation/Follow-up: Disposition and Establishing goals of care  Subjective: Patient lying in bed.  No distress.  Confused but denies complaints.  I had received call that family was present and wanted to meet.  By the time that I had arrived to the room, his family had left.  I attempted to call but no answer.  Length of Stay: 1 day  Current Medications: Scheduled Meds:  . antiseptic oral rinse  7 mL Mouth Rinse BID  . antiseptic oral rinse  7 mL Mouth Rinse BID  . aspirin EC  81 mg Oral Q breakfast  . cefTRIAXone (ROCEPHIN) IVPB 1 gram/50 mL D5W  1 g Intravenous QHS  . divalproex  250 mg Oral 3 times per day  . feeding supplement (ENSURE ENLIVE)  237 mL Oral TID BM  . megestrol  400 mg Oral BID  . metoprolol tartrate  12.5 mg Oral BID  . tamsulosin  0.4 mg Oral QPC supper    Continuous Infusions: . dextrose 5 % and 0.9% NaCl 1,000 mL (05/07/15 1209)    PRN Meds: acetaminophen **OR** acetaminophen, HYDROcodone-acetaminophen, ondansetron **OR** ondansetron (ZOFRAN) IV, polyvinyl alcohol  Physical Exam: Physical Exam       General: Alert, awake, confused HEENT: No bruits, no goiter, no JVD Heart: Regular rate and rhythm. Murmur appreciated. Lungs: Good air movement, clear Abdomen: Soft, nontender, nondistended, positive bowel sounds.  Ext: No significant edema Skin: Warm and dry Neuro: Moves 4 extremities        Vital Signs: BP 104/86 mmHg  Pulse 70  Temp(Src) 98.1 F (36.7 C) (Oral)  Resp 16  Wt 58.514 kg (129 lb)  SpO2  100% SpO2: SpO2: 100 % O2 Device: O2 Device: Nasal Cannula O2 Flow Rate: O2 Flow Rate (L/min): 2 L/min  Intake/output summary:  Intake/Output Summary (Last 24 hours) at 05/07/15 2239 Last data filed at 05/07/15 1407  Gross per 24 hour  Intake   1122 ml  Output    650 ml  Net    472 ml   LBM: Last BM Date: 05/06/15 Baseline  Weight: Weight: 58.514 kg (129 lb) Most recent weight: Weight: 58.514 kg (129 lb)       Palliative Assessment/Data: Flowsheet Rows        Most Recent Value   Intake Tab    Referral Department  Hospitalist   Unit at Time of Referral  Oncology Unit   Palliative Care Primary Diagnosis  Cardiac   Date Notified  05/05/15   Palliative Care Type  Return patient Palliative Care   Reason for referral  Clarify Goals of Care   Date of Admission  05/04/15   Date first seen by Palliative Care  05/06/15   # of days Palliative referral response time  1 Day(s)   # of days IP prior to Palliative referral  1   Clinical Assessment    Psychosocial & Spiritual Assessment    Palliative Care Outcomes       Additional Data Reviewed: CBC    Component Value Date/Time   WBC 6.5 05/05/2015 0433   RBC 3.44* 05/05/2015 0433   HGB 10.8* 05/05/2015 0433   HCT 33.5* 05/05/2015 0433   PLT 206 05/05/2015 0433   MCV 97.4 05/05/2015 0433   MCH 31.4 05/05/2015 0433   MCHC 32.2 05/05/2015 0433   RDW 13.7 05/05/2015 0433   LYMPHSABS 1.4 05/04/2015 2018   MONOABS 1.5* 05/04/2015 2018   EOSABS 0.0 05/04/2015 2018   BASOSABS 0.0 05/04/2015 2018    CMP     Component Value Date/Time   NA 136 05/07/2015 0416   K 4.9 05/07/2015 0416   CL 102 05/07/2015 0416   CO2 25 05/07/2015 0416   GLUCOSE 118* 05/07/2015 0416   BUN 49* 05/07/2015 0416   CREATININE 1.26* 05/07/2015 0416   CALCIUM 8.0* 05/07/2015 0416   PROT 5.1* 05/05/2015 0433   ALBUMIN 2.3* 05/05/2015 0433   AST 22 05/05/2015 0433   ALT 12* 05/05/2015 0433   ALKPHOS 28* 05/05/2015 0433   BILITOT 0.9 05/05/2015 0433    GFRNONAA 49* 05/07/2015 0416   GFRAA 57* 05/07/2015 0416       Problem List:  Patient Active Problem List   Diagnosis Date Noted  . UTI (urinary tract infection) 05/06/2015  . Acute on chronic renal failure (HCC) 05/04/2015  . UTI (lower urinary tract infection) 05/04/2015  . Palliative care encounter   . Dysphagia   . Coronary artery disease due to lipid rich plaque   . Acute diastolic heart failure, NYHA class 1 (HCC) 04/20/2015  . Pleural effusion 04/20/2015  . RHF (right heart failure) (HCC) 04/20/2015  . Diabetes mellitus, stable (HCC) 04/20/2015  . Acute diastolic heart failure (HCC) 04/20/2015  . Pressure ulcer 04/15/2015  . Aortic stenosis 04/15/2015  . Pulmonary hypertension (HCC)   . Hypoglycemia associated with diabetes (HCC)   . Dementia   . Biventricular implantable cardioverter-defibrillator medtronic  01/05/2015  . History of ischemic cardiomyopathy 01/05/2015  . Dyslipidemia   . Myoclonic jerking 01/04/2015  . Hypnagogic jerks 01/04/2015     Palliative Care Assessment & Plan    1.Code Status:  DNR    Code Status Orders        Start     Ordered   05/05/15 0007  Do not attempt resuscitation (DNR)   Continuous    Question Answer Comment  In the event of cardiac or respiratory ARREST Do not call a "code blue"   In the event of cardiac or respiratory ARREST Do not perform Intubation, CPR, defibrillation or ACLS   In the event  of cardiac or respiratory ARREST Use medication by any route, position, wound care, and other measures to relive pain and suffering. May use oxygen, suction and manual treatment of airway obstruction as needed for comfort.      05/05/15 0007    Code Status History    Date Active Date Inactive Code Status Order ID Comments User Context   04/20/2015  8:03 PM 04/25/2015  8:52 PM Partial Code [id number]  [name], NP Inpatient   04/11/2015  2:50 AM 04/15/2015  6:11 PM Partial Code [id number]  [name], MD Inpatient    01/06/2015  1:15 PM 01/08/2015  5:57 PM Partial Code [id number]  [name], MD Inpatient   01/04/2015  6:53 PM 01/06/2015  1:15 PM Full Code [id number]  [name], MD Inpatient       2. Goals of Care/Additional Recommendations: - I have been trying to meet with family.  I have asked his nephew to call to schedule time for a family meeting. - He was present today and told nurse that he wanted to meet with me.  By the time I arrived at the room an hour later he had left. - It appears that goal is for hospice, but his nephew has said that things were not working out with home hospice. - Social work has been working with family regarding disposition.  I think that the most likely scenario moving forward will be SNF placement. - Will continue to assist as able, however I have found it difficult to get family to hospital for a family meeting to discuss and was unable to talk to nephew further today.   3. Symptom Management:      1.Continue same  4. Palliative Prophylaxis:   Aspiration, Bowel Regimen and Delirium Protocol  5. Prognosis: <6 months  6. Discharge Planning:  Possible home with hospice (was on service with [facility]), more likely he will need SNF placement  Care plan was discussed with patient, SW, Dr. Randol Kern   Thank you for allowing the Palliative Medicine Team to assist in the care of this patient.   Time In: 1610 Time Out: 1630 Total Time 20 Prolonged Time Billed no        Romie Minus, MD  05/07/2015, 10:39 PM  Please contact Palliative Medicine Team phone at [phone] for questions and concerns.

## 2015-05-07 NOTE — [place] FL2 (Signed)
[place] MEDICAID FL2 LEVEL OF CARE SCREENING TOOL     IDENTIFICATION  Patient Name: Christopher Waller Birthdate: [birth year] Sex: male Admission Date (Current Location): 05/04/2015  [facility] and [place] Number:  Producer, television/film/video and Address:  [facility],  [address], [place] [address]      Provider Number: [id number]  Attending Physician Name and Address:  Starleen Arms, MD  Relative Name and Phone Number:       Current Level of Care: Hospital Recommended Level of Care: Skilled Nursing Facility Prior Approval Number:    Date Approved/Denied:   PASRR Number: [id number]  Discharge Plan: SNF    Current Diagnoses: Patient Active Problem List   Diagnosis Date Noted  . UTI (urinary tract infection) 05/06/2015  . Acute on chronic renal failure (HCC) 05/04/2015  . UTI (lower urinary tract infection) 05/04/2015  . Palliative care encounter   . Dysphagia   . Coronary artery disease due to lipid rich plaque   . Acute diastolic heart failure, NYHA class 1 (HCC) 04/20/2015  . Pleural effusion 04/20/2015  . RHF (right heart failure) (HCC) 04/20/2015  . Diabetes mellitus, stable (HCC) 04/20/2015  . Acute diastolic heart failure (HCC) 04/20/2015  . Pressure ulcer 04/15/2015  . Aortic stenosis 04/15/2015  . Pulmonary hypertension (HCC)   . Hypoglycemia associated with diabetes (HCC)   . Dementia   . Biventricular implantable cardioverter-defibrillator medtronic  01/05/2015  . History of ischemic cardiomyopathy 01/05/2015  . Dyslipidemia   . Myoclonic jerking 01/04/2015  . Hypnagogic jerks 01/04/2015    Orientation RESPIRATION BLADDER Height & Weight     Self  O2 (2L O2) Continent Weight: 129 lb (58.514 kg) Height:     BEHAVIORAL SYMPTOMS/MOOD NEUROLOGICAL BOWEL NUTRITION STATUS   (n/a)  (NONE) Continent  (Diet Dysphasia 3)  AMBULATORY STATUS COMMUNICATION OF NEEDS Skin   Extensive Assist Verbally PU Stage and Appropriate Care   PU Stage 2  Dressing: Daily (PRN)                   Personal Care Assistance Level of Assistance  Bathing, Feeding, Dressing Bathing Assistance: Maximum assistance Feeding assistance: Limited assistance Dressing Assistance: Maximum assistance     Functional Limitations Info  Sight, Hearing, Speech Sight Info: Adequate Hearing Info: Adequate Speech Info: Adequate    SPECIAL CARE FACTORS FREQUENCY  PT (By licensed PT), OT (By licensed OT)     PT Frequency: 5 x a week OT Frequency: 5 x a week            Contractures Contractures Info: Not present    Additional Factors Info  Code Status, Allergies Code Status Info: DNR code status Allergies Info: No Known Allergies           Current Medications (05/07/2015):  This is the current hospital active medication list Current Facility-Administered Medications  Medication Dose Route Frequency Provider Last Rate Last Dose  . acetaminophen (TYLENOL) tablet 650 mg  650 mg Oral Q6H PRN Therisa Doyne, MD       Or  . acetaminophen (TYLENOL) suppository 650 mg  650 mg Rectal Q6H PRN Therisa Doyne, MD      . antiseptic oral rinse (CPC / CETYLPYRIDINIUM CHLORIDE 0.05%) solution 7 mL  7 mL Mouth Rinse BID Therisa Doyne, MD   7 mL at 05/07/15 1000  . antiseptic oral rinse (CPC / CETYLPYRIDINIUM CHLORIDE 0.05%) solution 7 mL  7 mL Mouth Rinse BID Therisa Doyne, MD   7 mL at  05/07/15 1000  . aspirin EC tablet 81 mg  81 mg Oral Q breakfast Therisa Doyne, MD   81 mg at 05/07/15 0957  . cefTRIAXone (ROCEPHIN) 1 g in dextrose 5 % 50 mL IVPB  1 g Intravenous QHS Therisa Doyne, MD   1 g at 05/06/15 2200  . dextrose 5 %-0.9 % sodium chloride infusion   Intravenous Continuous Starleen Arms, MD 50 mL/hr at 05/07/15 1209 1,000 mL at 05/07/15 1209  . divalproex (DEPAKOTE SPRINKLE) capsule 250 mg  250 mg Oral 3 times per day Therisa Doyne, MD   250 mg at 05/07/15 1403  . feeding supplement (ENSURE ENLIVE) (ENSURE ENLIVE)  liquid 237 mL  237 mL Oral TID BM Tilda Franco, RD   237 mL at 05/07/15 1402  . HYDROcodone-acetaminophen (NORCO/VICODIN) 5-325 MG per tablet 1-2 tablet  1-2 tablet Oral Q4H PRN Therisa Doyne, MD   1 tablet at 05/07/15 0204  . megestrol (MEGACE) 400 MG/10ML suspension 400 mg  400 mg Oral BID Starleen Arms, MD   400 mg at 05/07/15 1453  . metoprolol tartrate (LOPRESSOR) tablet 12.5 mg  12.5 mg Oral BID Therisa Doyne, MD   12.5 mg at 05/07/15 0957  . ondansetron (ZOFRAN) tablet 4 mg  4 mg Oral Q6H PRN Therisa Doyne, MD       Or  . ondansetron (ZOFRAN) injection 4 mg  4 mg Intravenous Q6H PRN Therisa Doyne, MD      . polyvinyl alcohol (LIQUIFILM TEARS) 1.4 % ophthalmic solution 1 drop  1 drop Left Eye PRN Starleen Arms, MD      . tamsulosin (FLOMAX) capsule 0.4 mg  0.4 mg Oral QPC supper Starleen Arms, MD   0.4 mg at 05/06/15 1800     Discharge Medications: Please see discharge summary for a list of discharge medications.  Relevant Imaging Results:  Relevant Lab Results:   Additional Information SSN: [id number] Pt to have Palliative Care Services at SNF  KIDD, SUZANNA A, LCSW

## 2015-05-07 NOTE — Evaluation (Signed)
Physical Therapy Evaluation Patient Details Name: Christopher Waller MRN: [id number] DOB: 1924/04/05 Today's Date: 05/07/2015   History of Present Illness  82 yo male admitted with confusion and shaking. Presented with fatigue 05/04/15. Notes state that he was evaluated by Hospice nursewas felt to be at his baseline but family wanted him to be 'sent out'. Patient's nephew called in and reported that he was disturbed by patient's jerking whichhe has a history of myoclonic jerking . PMH: dementia, CHF, CAD, kidney disease, s/p CABG, DM2, I . Patient was  DC'd from [facility] 1/16 with Hospice.  Has implantable cardioverter-defibrillator.  Clinical Impression  Patient is  Pleasantly confused, is not able to really participate, constant jerking of extremities and body. Patient was followed by Hospice at [facility]. No further PT needs at this time. PT to sign off.    Follow Up Recommendations No PT follow up    Equipment Recommendations  None recommended by PT    Recommendations for Other Services       Precautions / Restrictions Precautions Precautions: Fall      Mobility  Bed Mobility Overal bed mobility: Needs Assistance Bed Mobility: Supine to Sit;Sit to Supine     Supine to sit: Total assist;+2 for physical assistance;HOB elevated Sit to supine: Total assist;+2 for physical assistance   General bed mobility comments: patient does not  assist, significant and constant jerking of his body.  Transfers                    Ambulation/Gait                Stairs            Wheelchair Mobility    Modified Rankin (Stroke Patients Only)       Balance Overall balance assessment: Needs assistance Sitting-balance support: Bilateral upper extremity supported;Feet supported Sitting balance-Leahy Scale: Poor                                       Pertinent Vitals/Pain Pain Assessment: No/denies pain    Home Living Family/patient expects to be discharged  to:: Skilled nursing facility                 Additional Comments: lived with nephew, had Hospice    Prior Function                 Hand Dominance        Extremity/Trunk Assessment     RUE Deficits / Details: jerking UE , WFL AROM shoulder flexion     LUE Deficits / Details: jerking motion WFL AROM shoulder flexion     RLE Deficits / Details: jerking of legs consistently LLE Deficits / Details: same as R  Cervical / Trunk Assessment: Kyphotic  Communication      Cognition Arousal/Alertness: Lethargic Behavior During Therapy: Restless Overall Cognitive Status:  (chart reports h/o dementia) Area of Impairment: Orientation;Following commands               General Comments: patient states [place]    General Comments      Exercises        Assessment/Plan    PT Assessment Patent does not need any further PT services (patient was  home with Hospice and currently total care.)  PT Diagnosis Altered mental status   PT Problem List Decreased balance;Decreased mobility;Decreased cognition  PT Treatment Interventions  PT Goals (Current goals can be found in the Care Plan section) Acute Rehab PT Goals PT Goal Formulation: All assessment and education complete, DC therapy    Frequency     Barriers to discharge        Co-evaluation               End of Session   Activity Tolerance: Patient limited by lethargy Patient left: in bed;with call bell/phone within reach;with bed alarm set           Time: 1022-1039 PT Time Calculation (min) (ACUTE ONLY): 17 min   Charges:         PT G CodesRada Hay 05/07/2015, 12:58 PM  Blanchard Kelch PT [phone].sign

## 2015-05-07 NOTE — Consult Note (Signed)
Consultation Note Date: 05/07/2015   Patient Name: Christopher Waller  DOB: 22-Jul-1924  MRN: [id number]  Age / Sex: 82 y.o., male  PCP: No Pcp Per Patient Referring Physician: Starleen Arms, MD  Reason for Consultation: Disposition and Establishing goals of care  Clinical Assessment/Narrative: Mr. Christopher Waller is an 82 year old male with past medical history of Coronary artery disease; Dementia; Diabetes mellitus without complication (HCC); Hyperlipidemia; Ischemic cardiomyopathy (01/05/2015); and Biventricular implantable cardioverter-defibrillator medtronic (01/05/2015).  He was recently admitted to hospital and discharged home with hospice support. On the day of his admission his hospice nurse saw him at home but his family wanted him to brought the hospital. He being treated for urinary tract infection.  On exam the patient is confused and unable to provide any history. I called and spoke with his nephew, [name]. [name] reports that he had been helping care for his uncle in their home with hospice support. He states that it "isn't quite when I thought." He is not really able to elaborate more on this. It's difficult to obtain information as [name] will redirect when asked any questions.  I asked what he thinks his uncle want moving forward, and he replied "I don't know if he can come back here."  He states it is been difficult to care for his uncle and his care needs seem to be getting higher. He reports that they have not been sleeping the past few days and if Mr. Rapaport requires the same level of care that he did prior to this hospitalization he does not feel that he is going to be able to care for him.  Contacts/Participants in Discussion: Primary Decision Maker: [name] Relationship to Patient nephew  SUMMARY OF RECOMMENDATIONS - DNR/DNI - Patient was living at home with hospice support.  His nephew reports  he is not sure that this will be a possibility moving forward. He states it would depend upon his care needs.  I'm concerned as his care needs are only going to increase from this point moving forward. I encouraged his nephew to come to the hospital where he could see for himself the level of care his uncle needs and we can talk in person about options moving forward.  He reported he would come to visit but cannot give me a time frame. - I think there is a high likelihood that he'll require skilled placement moving forward. I discussed this with Dr. Randol Kern  Code Status/Advance Care Planning: DNR    Code Status Orders        Start     Ordered   05/05/15 0007  Do not attempt resuscitation (DNR)   Continuous    Question Answer Comment  In the event of cardiac or respiratory ARREST Do not call a "code blue"   In the event of cardiac or respiratory ARREST Do not perform Intubation, CPR, defibrillation or ACLS   In the event of cardiac or respiratory ARREST Use medication by any route, position, wound care, and other measures to relive pain and suffering. May use oxygen, suction and manual treatment of airway obstruction as needed for comfort.      05/05/15 0007    Code Status History    Date Active Date Inactive Code Status Order ID Comments User Context   04/20/2015  8:03 PM 04/25/2015  8:52 PM Partial Code [id number]  [name], NP Inpatient   04/11/2015  2:50 AM 04/15/2015  6:11 PM Partial Code [id number]  [name], MD  Inpatient   01/06/2015  1:15 PM 01/08/2015  5:57 PM Partial Code [id number]  [name], MD Inpatient   01/04/2015  6:53 PM 01/06/2015  1:15 PM Full Code [id number]  [name], MD Inpatient      Palliative Prophylaxis:   Aspiration, Bowel Regimen, Delirium Protocol and Frequent Pain Assessment  Psycho-social/Spiritual:  Support System: Poor Desire for further Chaplaincy support:no Additional Recommendations: Education on Hospice  Prognosis:  Weeks to months  Discharge Planning: Home with home hospice versus skilled facility   Chief Complaint/ Primary Diagnoses: Present on Admission:  . Coronary artery disease due to lipid rich plaque . Dementia . Acute on chronic renal failure (HCC) . Myoclonic jerking . UTI (lower urinary tract infection) . UTI (urinary tract infection)  I have reviewed the medical record, interviewed the patient and family, and examined the patient. The following aspects are pertinent.  Past Medical History  Diagnosis Date  . Coronary artery disease   . Dementia   . Diabetes mellitus without complication (HCC)   . Hyperlipidemia   . Ischemic cardiomyopathy 01/05/2015  . Biventricular implantable cardioverter-defibrillator medtronic  01/05/2015   Social History   Social History  . Marital Status: Unknown    Spouse Name: N/A  . Number of Children: N/A  . Years of Education: N/A   Social History Main Topics  . Smoking status: Former Smoker    Quit date: 03/30/1964  . Smokeless tobacco: None  . Alcohol Use: No  . Drug Use: None  . Sexual Activity: Not Currently   Other Topics Concern  . None   Social History Narrative   Family History  Problem Relation Age of Onset  . Hypertension Other    Scheduled Meds: . antiseptic oral rinse  7 mL Mouth Rinse BID  . antiseptic oral rinse  7 mL Mouth Rinse BID  . aspirin EC  81 mg Oral Q breakfast  . cefTRIAXone (ROCEPHIN) IVPB 1 gram/50 mL D5W  1 g Intravenous QHS  . divalproex  250 mg Oral 3 times per day  . feeding supplement (ENSURE ENLIVE)  237 mL Oral BID BM  . metoprolol tartrate  12.5 mg Oral BID  . tamsulosin  0.4 mg Oral QPC supper   Continuous Infusions: . dextrose 5 % and 0.9% NaCl 1,000 mL (05/06/15 1755)   PRN Meds:.acetaminophen **OR** acetaminophen, HYDROcodone-acetaminophen, ondansetron **OR** ondansetron (ZOFRAN) IV Medications Prior to Admission:  Prior to Admission medications   Medication Sig Start Date End Date  Taking? Authorizing Provider  aspirin 81 MG tablet Take 81 mg by mouth daily with breakfast. Reported on 05/04/2015    Historical Provider, MD  divalproex (DEPAKOTE SPRINKLE) 125 MG capsule Take 2 capsules (250 mg total) by mouth every 8 (eight) hours. Patient not taking: Reported on 05/04/2015 01/07/15   [name], MD  fenofibrate (TRICOR) 145 MG tablet Take 145 mg by mouth at bedtime. Reported on 05/04/2015    Historical Provider, MD  furosemide (LASIX) 20 MG tablet Take 1 tablet (20 mg total) by mouth daily. Patient not taking: Reported on 05/04/2015 04/25/15   Joseph Art, DO  metoprolol tartrate (LOPRESSOR) 25 MG tablet Take 0.5 tablets (12.5 mg total) by mouth 2 (two) times daily. Patient not taking: Reported on 05/04/2015 04/25/15   Joseph Art, DO   No Known Allergies  Review of Systems  Unable to obtain  Physical Exam General: Alert, awake, confused HEENT: No bruits, no goiter, no JVD Heart: Regular rate and rhythm. Murmur appreciated. Lungs:  Good air movement, clear Abdomen: Soft, nontender, nondistended, positive bowel sounds.  Ext: No significant edema Skin: Warm and dry Neuro: Moves 4 extremities  Vital Signs: BP 80/51 mmHg  Pulse 72  Temp(Src) 97.8 F (36.6 C) (Oral)  Resp 18  Wt 58.514 kg (129 lb)  SpO2 100%  SpO2: SpO2: 100 % O2 Device:SpO2: 100 % O2 Flow Rate: .O2 Flow Rate (L/min): 2 L/min  IO: Intake/output summary:  Intake/Output Summary (Last 24 hours) at 05/07/15 0739 Last data filed at 05/07/15 0709  Gross per 24 hour  Intake 816.17 ml  Output   1250 ml  Net -433.83 ml    LBM: Last BM Date: 05/06/15 Baseline Weight: Weight: 58.514 kg (129 lb) Most recent weight: Weight: 58.514 kg (129 lb)      Palliative Assessment/Data:    Additional Data Reviewed:  CBC:    Component Value Date/Time   WBC 6.5 05/05/2015 0433   HGB 10.8* 05/05/2015 0433   HCT 33.5* 05/05/2015 0433   PLT 206 05/05/2015 0433   MCV 97.4 05/05/2015 0433   NEUTROABS  4.5 05/04/2015 2018   LYMPHSABS 1.4 05/04/2015 2018   MONOABS 1.5* 05/04/2015 2018   EOSABS 0.0 05/04/2015 2018   BASOSABS 0.0 05/04/2015 2018   Comprehensive Metabolic Panel:    Component Value Date/Time   NA 136 05/07/2015 0416   K 4.9 05/07/2015 0416   CL 102 05/07/2015 0416   CO2 25 05/07/2015 0416   BUN 49* 05/07/2015 0416   CREATININE 1.26* 05/07/2015 0416   GLUCOSE 118* 05/07/2015 0416   CALCIUM 8.0* 05/07/2015 0416   AST 22 05/05/2015 0433   ALT 12* 05/05/2015 0433   ALKPHOS 28* 05/05/2015 0433   BILITOT 0.9 05/05/2015 0433   PROT 5.1* 05/05/2015 0433   ALBUMIN 2.3* 05/05/2015 0433     Time In: 1520  Time Out: 1620 Time Total: 60 Greater than 50%  of this time was spent counseling and coordinating care related to the above assessment and plan.  Signed by: Romie Minus, MD  Romie Minus, MD  05/07/2015, 7:39 AM  Please contact Palliative Medicine Team phone at [phone] for questions and concerns.

## 2015-05-07 NOTE — Progress Notes (Signed)
Patient Demographics  Christopher Waller, is a 82 y.o. male, DOB - 12-05-1924, ZOX:[id number]  Admit date - 05/04/2015   Admitting Physician Therisa Doyne, MD  Outpatient Primary MD for the patient is No PCP Per Patient  LOS -    Chief Complaint  Patient presents with  . Weakness       Admission HPI/Brief narrative: 82 year old malewith  history of aortic stenosis,  ischemic cardiomyopathy , pulmonary hypertension and associated diastolic heart failure, dyslipidemia ,dementia, and diabetes , agent at home with home hospice , presents with fever, confusion, and jerking movements , workup significant for UTI , acute on chronic renal failure , hypertension, has not been taking his Depakote for myoclonic jerks . Subjective:   [employer] today has, No headache, No chest pain, No abdominal pain - No Nausea, No Cough - SOB.   Assessment & Plan    Active Problems:   Myoclonic jerking   History of ischemic cardiomyopathy   Dementia   Diabetes mellitus, stable (HCC)   Coronary artery disease due to lipid rich plaque   Acute on chronic renal failure (HCC)   UTI (lower urinary tract infection)   UTI (urinary tract infection)   acute encephalopathy  - This is in the setting of dementia, with metabolic encephalopathy secondary to UTI /delirium - No focal neurological deficits  - Resolved, back to baseline   diabetes mellitus - Initially on SSI, has been stopped. Hypoglycemia.  Hypoglycemia - Secondary to poor oral intake, continue with D5 NS, will start on Megace to improve oral intake,.   UTI - Continue with Rocephin, urine cultures showing multiple species.  Urinary retention - Patient with multiple attempts of ins and outs, with recurrent urinary retention, coud size 20,  catheter inserted yesterday, started on Flomax   acute on chronic renal failure stage III  - Due to  dehydration  and volume depletion, , Baseline creatinine 1.5, on admission 2.4, improving with IV fluids, today is 1.2  ,continue with IV fluids , monitor closely given his history of CHF . - Resolved    myoclonic jerks - Resume Depakote  History of ischemic cardiac myopathy  - LVEF 50-55% - Continue metoprolol, s/p BiV -ICD placement, 100% paced - Also with moderate TR, moderate to severe pulmonary hypertension and moderate dilatation of right ventricle with reduced systolic function  Dementia - Home with hospice   Code Status: DO NOT RESUSCITATE   Family Communication: Discussed with nephew at bedside  Disposition Plan: Followed by Child psychotherapist.   Procedures  None   Consults   Palliative medicine    Medications  Scheduled Meds: . antiseptic oral rinse  7 mL Mouth Rinse BID  . antiseptic oral rinse  7 mL Mouth Rinse BID  . aspirin EC  81 mg Oral Q breakfast  . cefTRIAXone (ROCEPHIN) IVPB 1 gram/50 mL D5W  1 g Intravenous QHS  . divalproex  250 mg Oral 3 times per day  . feeding supplement (ENSURE ENLIVE)  237 mL Oral TID BM  . megestrol  400 mg Oral BID  . metoprolol tartrate  12.5 mg Oral BID  . tamsulosin  0.4 mg Oral QPC supper   Continuous Infusions: . dextrose 5 % and 0.9% NaCl 1,000 mL (  05/07/15 1209)   PRN Meds:.acetaminophen **OR** acetaminophen, HYDROcodone-acetaminophen, ondansetron **OR** ondansetron (ZOFRAN) IV  DVT Prophylaxis  SCDs   Lab Results  Component Value Date   PLT 206 05/05/2015    Antibiotics    Anti-infectives    Start     Dose/Rate Route Frequency Ordered Stop   05/05/15 0015  cefTRIAXone (ROCEPHIN) 1 g in dextrose 5 % 50 mL IVPB     1 g 100 mL/hr over 30 Minutes Intravenous Daily at bedtime 05/05/15 0007            Objective:   Filed Vitals:   05/06/15 2007 05/07/15 0542 05/07/15 0957 05/07/15 1300  BP: 149/106 80/51 110/83 104/86  Pulse: 108 72  70  Temp: 97 F (36.1 C) 97.8 F (36.6 C)  98.1 F (36.7 C)  TempSrc: Oral Oral   Oral  Resp: 18 18  16   Weight:      SpO2: 100% 100%  100%    Wt Readings from Last 3 Encounters:  05/05/15 58.514 kg (129 lb)  04/25/15 58.4 kg (128 lb 12 oz)  04/14/15 67 kg (147 lb 11.3 oz)     Intake/Output Summary (Last 24 hours) at 05/07/15 1433 Last data filed at 05/07/15 1407  Gross per 24 hour  Intake 1376.17 ml  Output   1250 ml  Net 126.17 ml     Physical Exam  Awake Alert. Supple Neck,No JVD. Symmetrical Chest wall movement, Good air movement bilaterally,no wheezing  RRR,No Gallops,Rubs , +SEM +ve B.Sounds, Abd Soft, No tenderness, No rebound - guarding or rigidity. No Cyanosis, Clubbing or edema,    Data Review   Micro Results Recent Results (from the past 240 hour(s))  Urine culture     Status: None   Collection Time: 05/04/15  7:59 PM  Result Value Ref Range Status   Specimen Description URINE, CATHETERIZED  Final   Special Requests NONE  Final   Culture   Final    MULTIPLE SPECIES PRESENT, SUGGEST RECOLLECTION Performed at [facility]    Report Status 05/06/2015 FINAL  Final    Radiology Reports Dg Chest 2 View  04/21/2015  CLINICAL DATA:  Acute diastolic heart failure EXAM: CHEST  2 VIEW COMPARISON:  04/20/2015 FINDINGS: Cardiomegaly again noted. Three leads cardiac pacemaker is unchanged in position. Persistent mild interstitial prominence bilateral probable mild interstitial edema. Again noted small bilateral pleural effusion with bilateral basilar atelectasis. Atherosclerotic calcifications of thoracic aorta. Osteopenia and degenerative changes thoracolumbar spine. Stable compression deformity upper lumbar spine. IMPRESSION: Three leads cardiac pacemaker is unchanged in position. Persistent mild interstitial prominence bilateral probable mild interstitial edema. Again noted small bilateral pleural effusion with bilateral basilar atelectasis. Electronically Signed   By: Natasha Mead M.D.   On: 04/21/2015 09:22   Dg Chest 2 View  04/20/2015   CLINICAL DATA:  82 year old male with wheezing, shortness of breath chest EXAM: CHEST  2 VIEW COMPARISON:  Prior chest x-ray 04/13/2015 FINDINGS: Stable position of left subclavian approach biventricular cardiac rhythm maintenance device. The leads project over the right atrium, right ventricle and overlying the left ventricle. Cardiac and mediastinal contours are unchanged. Atherosclerotic calcifications present in the transverse aorta. Patient is status post median sternotomy. Slightly increased pulmonary vascular congestion without overt edema. No pneumothorax. Small bilateral pleural effusions. Compression deformity of L1. No acute osseous abnormality. IMPRESSION: 1. Slightly increased pulmonary vascular congestion bordering on mild edema. 2. Small bilateral pleural effusions. 3. Stable cardiomegaly. 4. Age indeterminate L1 compression fracture.  Favor  remote. Electronically Signed   By: Malachy Moan M.D.   On: 04/20/2015 13:48   Ct Soft Tissue Neck W Contrast  04/20/2015  CLINICAL DATA:  82 year old diabetic male with dementia coughing or choking to the large secretions. Initial encounter. EXAM: CT NECK WITH CONTRAST TECHNIQUE: Multidetector CT imaging of the neck was performed using the standard protocol following the bolus administration of intravenous contrast. CONTRAST:  75mL OMNIPAQUE IOHEXOL 300 MG/ML  SOLN COMPARISON:  None. FINDINGS: Pharynx and larynx: Negative. Salivary glands: Negative. Thyroid: Negative. Lymph nodes: No adenopathy. Vascular: Significant atherosclerotic changes including bilateral carotid bifurcation calcification with 62% diameter stenosis proximal right internal carotid artery and 64% diameter stenosis proximal left internal carotid artery. Moderate to marked narrowing right vertebral artery and mild to moderate narrowing left vertebral artery at the level of foramen magnum. Limited intracranial: Atrophy and small vessel disease changes. Visualized orbits: Limited  evaluation and unremarkable. Mastoids and visualized paranasal sinuses: Minimal polypoid opacification inferior right maxillary sinus. Skeleton: Degenerative changes cervical spine most notable C3-4 and C5-6 with fusion C4-5. Remote anterior wedge compression deformity T2 with 50% loss height anteriorly. Upper chest: Small to slightly moderate-size left-sided and small right-sided pleural effusion with adjacent mild atelectasis. Pacemaker in place. IMPRESSION: Exam limited by motion and dental artifact without pharyngeal mass noted. Bilateral carotid bifurcation calcification with 62% diameter stenosis proximal right internal carotid artery and 64% diameter stenosis proximal left internal carotid artery. Moderate to marked narrowing right vertebral artery and mild to moderate narrowing left vertebral artery at the level of foramen magnum. Degenerative changes cervical spine most notable C3-4 and C5-6 with fusion C4-5. Remote anterior wedge compression deformity T2 with 50% loss height anteriorly. Small to slightly moderate-size left-sided and small right-sided pleural effusion with adjacent mild atelectasis. Electronically Signed   By: Lacy Duverney M.D.   On: 04/20/2015 15:20   Dg Chest Portable 1 View  05/04/2015  CLINICAL DATA:  Short of breath EXAM: PORTABLE CHEST 1 VIEW COMPARISON:  04/21/2015 FINDINGS: AICD remains in good position. Negative for heart failure. Negative for pneumonia or effusion. Lungs are clear with improved aeration since prior study IMPRESSION: No active disease. Electronically Signed   By: Marlan Palau M.D.   On: 05/04/2015 19:45   Dg Chest Port 1 View  04/13/2015  CLINICAL DATA:  Pneumonia.  Short of breath. EXAM: PORTABLE CHEST 1 VIEW COMPARISON:  04/11/2015 FINDINGS: Heart remains mildly enlarged. Left subclavian AICD device is stable. Sternal wires are stable. Lungs under aerated and grossly clear. No pneumothorax. IMPRESSION: No active disease. Electronically Signed   By: Jolaine Click M.D.   On: 04/13/2015 10:21   Dg Chest Port 1 View  04/11/2015  CLINICAL DATA:  82 year old male with hypotension EXAM: PORTABLE CHEST 1 VIEW COMPARISON:  None. FINDINGS: Single-view of the chest does not demonstrate a focal consolidation. There is no pleural effusion or pneumothorax. Mild cardiomegaly. Left pectoral AICD device. Median sternotomy wires and CABG vascular clips. The osseous structures are grossly unremarkable with IMPRESSION: No acute cardiopulmonary process. Electronically Signed   By: Elgie Collard M.D.   On: 04/11/2015 01:36   Dg Swallowing Func-speech Pathology  04/22/2015  Objective Swallowing Evaluation:   MBS Patient Details Name: Elba Dendinger MRN: [id number] Date of Birth: December 21, 1924 Today's Date: 04/22/2015 Time: SLP Start Time (ACUTE ONLY): 0818-SLP Stop Time (ACUTE ONLY): 0837 SLP Time Calculation (min) (ACUTE ONLY): 19 min Past Medical History: Past Medical History Diagnosis Date . Coronary artery disease  . Dementia  .  Diabetes mellitus without complication (HCC)  . Hyperlipidemia  . Ischemic cardiomyopathy 01/05/2015 . Biventricular implantable cardioverter-defibrillator medtronic  01/05/2015 Past Surgical History: Past Surgical History Procedure Laterality Date . Pacemaker insertion   . Coronary artery bypass graft   HPI: Pt is an 82 y.o. male with PMH of aortic stenosis, history of ischemic cardiomyopathy, significant pulmonary hypertension and associated diastolic heart failure, dyslipidemia and dementia and diabetes. Per chart, pt was discharged on 1/16 after an admission for syncope and collapse in setting of bradycardia, hypoglycemia and hypotension. Admitted 1/21 for shortness of breath and gurgling in throat. CXR 1/22 showed bilateral pleural effusions with bilateral basilar atelectasis. Pt had MBS 01/06/15 which showed mild oral, moderate-severe pharyngeal dysphagia with suspected esophageal component- premature spill with large amount of pharyngeal penetration and  poor sensation, also poor laryngeal elevation, tongue base retraction, and pharyngeal contraction. Dysphagia 3/ thin liquids were recommended. MBS recommended given history. No Data Recorded Assessment / Plan / Recommendation CHL IP CLINICAL IMPRESSIONS 04/22/2015 Therapy Diagnosis Severe pharyngeal phase dysphagia;Moderate pharyngeal phase dysphagia;Mild oral phase dysphagia Clinical Impression Pt's swallow function has deteriorated from MBS in 12/2014. Tongue base retraction, laryngeal elevation significantly decreased resulting in mod-max vallecular and pyriform sinus residue. Therapeutic strategies were unsuccessful to decrease pharyngeal residue to a safe amount and pt silently penetrated and aspirated in between and during swallows. Thin and nectar thick barium yielded similar volumes of pharyngeal residue. Brief esophageal scan did not reveal overt abnormalities (MBS does not diagnose below level of UES). Pt has chronic dysphagia and currently is at high risk with all consistencies. Results discussed with Dr. Susie Cassette who stated she will discuss wishes for nutrition with family. Will follow up for any further education.       Impact on safety and function Severe aspiration risk   CHL IP TREATMENT RECOMMENDATION 01/06/2015 Treatment Recommendations Therapy as outlined in treatment plan below   Prognosis 01/06/2015 Prognosis for Safe Diet Advancement Good Barriers to Reach Goals Cognitive deficits;Time post onset Barriers/Prognosis Comment -- CHL IP DIET RECOMMENDATION 04/22/2015 SLP Diet Recommendations NPO Liquid Administration via -- Medication Administration -- Compensations -- Postural Changes --   CHL IP OTHER RECOMMENDATIONS 04/22/2015 Recommended Consults -- Oral Care Recommendations Oral care QID Other Recommendations --   CHL IP FOLLOW UP RECOMMENDATIONS 04/22/2015 Follow up Recommendations None   CHL IP FREQUENCY AND DURATION 01/06/2015 Speech Therapy Frequency (ACUTE ONLY) min 2x/week Treatment Duration 2  weeks      CHL IP ORAL PHASE 04/22/2015 Oral Phase Impaired Oral - Pudding Teaspoon -- Oral - Pudding Cup -- Oral - Honey Teaspoon -- Oral - Honey Cup -- Oral - Nectar Teaspoon -- Oral - Nectar Cup -- Oral - Nectar Straw -- Oral - Thin Teaspoon -- Oral - Thin Cup -- Oral - Thin Straw -- Oral - Puree -- Oral - Mech Soft -- Oral - Regular Delayed oral transit;Weak lingual manipulation Oral - Multi-Consistency -- Oral - Pill -- Oral Phase - Comment --  CHL IP PHARYNGEAL PHASE 04/22/2015 Pharyngeal Phase Impaired Pharyngeal- Pudding Teaspoon -- Pharyngeal -- Pharyngeal- Pudding Cup -- Pharyngeal -- Pharyngeal- Honey Teaspoon -- Pharyngeal -- Pharyngeal- Honey Cup -- Pharyngeal -- Pharyngeal- Nectar Teaspoon -- Pharyngeal -- Pharyngeal- Nectar Cup Pharyngeal residue - valleculae;Pharyngeal residue - pyriform;Reduced tongue base retraction;Reduced laryngeal elevation;Penetration/Aspiration during swallow Pharyngeal Material enters airway, CONTACTS cords and not ejected out Pharyngeal- Nectar Straw -- Pharyngeal -- Pharyngeal- Thin Teaspoon Delayed swallow initiation-pyriform sinuses;Pharyngeal residue - valleculae;Pharyngeal residue - pyriform;Reduced tongue base retraction;Reduced laryngeal elevation  Pharyngeal -- Pharyngeal- Thin Cup Delayed swallow initiation-pyriform sinuses;Pharyngeal residue - valleculae;Pharyngeal residue - pyriform;Reduced tongue base retraction;Reduced laryngeal elevation Pharyngeal -- Pharyngeal- Thin Straw -- Pharyngeal -- Pharyngeal- Puree Penetration/Aspiration during swallow;Pharyngeal residue - valleculae;Pharyngeal residue - pyriform;Reduced laryngeal elevation;Reduced tongue base retraction Pharyngeal Material enters airway, passes BELOW cords then ejected out;Material enters airway, remains ABOVE vocal cords and not ejected out Pharyngeal- Mechanical Soft -- Pharyngeal -- Pharyngeal- Regular Pharyngeal residue - valleculae;Pharyngeal residue - pyriform;Reduced tongue base  retraction;Reduced laryngeal elevation Pharyngeal -- Pharyngeal- Multi-consistency -- Pharyngeal -- Pharyngeal- Pill -- Pharyngeal -- Pharyngeal Comment --  CHL IP CERVICAL ESOPHAGEAL PHASE 04/22/2015 Cervical Esophageal Phase WFL Pudding Teaspoon -- Pudding Cup -- Honey Teaspoon -- Honey Cup -- Nectar Teaspoon -- Nectar Cup -- Nectar Straw -- Thin Teaspoon -- Thin Cup -- Thin Straw -- Puree -- Mechanical Soft -- Regular -- Multi-consistency -- Pill -- Cervical Esophageal Comment -- CHL IP GO 04/22/2015 Functional Assessment Tool Used skilled clinical judgement Functional Limitations Swallowing Swallow Current Status (G9562) CL Swallow Goal Status (Z3086) CK Swallow Discharge Status (V7846) (None) Motor Speech Current Status (N6295) (None) Motor Speech Goal Status (M8413) (None) Motor Speech Goal Status (K4401) (None) Spoken Language Comprehension Current Status (U2725) (None) Spoken Language Comprehension Goal Status (D6644) (None) Spoken Language Comprehension Discharge Status (I3474) (None) Spoken Language Expression Current Status (Q5956) (None) Spoken Language Expression Goal Status (L8756) (None) Spoken Language Expression Discharge Status [phone]) (None) Attention Current Status (J1884) (None) Attention Goal Status (Z6606) (None) Attention Discharge Status (T0160) (None) Memory Current Status (F0932) (None) Memory Goal Status (T5573) (None) Memory Discharge Status (U2025) (None) Voice Current Status (K2706) (None) Voice Goal Status (C3762) (None) Voice Discharge Status (G3151) (None) Other Speech-Language Pathology Functional Limitation [phone]) (None) Other Speech-Language Pathology Functional Limitation Goal Status (V3710) (None) Other Speech-Language Pathology Functional Limitation Discharge Status [phone]) (None) Royce Macadamia 04/22/2015, 10:32 AM Breck Coons Lonell Face.Ed CCC-SLP Pager [phone]                CBC  Recent Labs Lab 05/04/15 2018 05/05/15 0433  WBC 7.4 6.5  HGB 12.4* 10.8*  HCT  38.0* 33.5*  PLT 249 206  MCV 95.5 97.4  MCH 31.2 31.4  MCHC 32.6 32.2  RDW 13.6 13.7  LYMPHSABS 1.4  --   MONOABS 1.5*  --   EOSABS 0.0  --   BASOSABS 0.0  --     Chemistries   Recent Labs Lab 05/04/15 2018 05/05/15 0433 05/06/15 0434 05/07/15 0416  NA 133* 136 134* 136  K 4.5 4.1 4.2 4.9  CL 93* 97* 99* 102  CO2 28 30 27 25   GLUCOSE 202* 113* 95 118*  BUN 60* 64* 60* 49*  CREATININE 2.37* 2.40* 1.82* 1.26*  CALCIUM 8.8* 8.4* 7.8* 8.0*  MG  --  2.2  --   --   AST 24 22  --   --   ALT 11* 12*  --   --   ALKPHOS 39 28*  --   --   BILITOT 0.8 0.9  --   --    ------------------------------------------------------------------------------------------------------------------ estimated creatinine clearance is 33.5 mL/min (by C-G formula based on Cr of 1.26). ------------------------------------------------------------------------------------------------------------------ No results for input(s): HGBA1C in the last 72 hours. ------------------------------------------------------------------------------------------------------------------ No results for input(s): CHOL, HDL, LDLCALC, TRIG, CHOLHDL, LDLDIRECT in the last 72 hours. ------------------------------------------------------------------------------------------------------------------  Recent Labs  05/05/15 0433  TSH 3.591   ------------------------------------------------------------------------------------------------------------------ No results for input(s): VITAMINB12, FOLATE, FERRITIN, TIBC, IRON, RETICCTPCT in the last 72 hours.  Coagulation  profile No results for input(s): INR, PROTIME in the last 168 hours.  No results for input(s): DDIMER in the last 72 hours.  Cardiac Enzymes  Recent Labs Lab 05/04/15 2018  TROPONINI 0.06*   ------------------------------------------------------------------------------------------------------------------ Invalid input(s): POCBNP     Time Spent in minutes    30 minutes   ELGERGAWY, DAWOOD M.D on 05/07/2015 at 2:33 PM  Between 7am to 7pm - Pager - [phone]  After 7pm go to [email or web address] - password [facility]  [facility]   Office  [phone]

## 2015-05-08 DIAGNOSIS — F0390 Unspecified dementia without behavioral disturbance: Secondary | ICD-10-CM

## 2015-05-08 LAB — BASIC METABOLIC PANEL
ANION GAP: 6 (ref 5–15)
BUN: 32 mg/dL — ABNORMAL HIGH (ref 6–20)
CHLORIDE: 103 mmol/L (ref 101–111)
CO2: 30 mmol/L (ref 22–32)
Calcium: 8.2 mg/dL — ABNORMAL LOW (ref 8.9–10.3)
Creatinine, Ser: 0.94 mg/dL (ref 0.61–1.24)
GFR calc non Af Amer: >60 mL/min (ref >60)
GLUCOSE: 253 mg/dL — AB (ref 65–99)
Potassium: 3.9 mmol/L (ref 3.5–5.1)
Sodium: 139 mmol/L (ref 135–145)

## 2015-05-08 LAB — GLUCOSE, CAPILLARY
GLUCOSE-CAPILLARY: 283 mg/dL — AB (ref 65–99)
GLUCOSE-CAPILLARY: 210 mg/dL — AB (ref 65–99)
Glucose-Capillary: 222 mg/dL — ABNORMAL HIGH (ref 65–99)

## 2015-05-08 NOTE — Clinical Social Work Placement (Signed)
   CLINICAL SOCIAL WORK PLACEMENT  NOTE  Date:  05/08/2015  Patient Details  Name: Phillip Maffei MRN: [id number] Date of Birth: [birth year]  Clinical Social Work is seeking post-discharge placement for this patient at the Skilled  Nursing Facility level of care (*CSW will initial, date and re-position this form in  chart as items are completed):  Yes   Patient/family provided with [facility] Clinical Social Work Department's list of facilities offering this level of care within the geographic area requested by the patient (or if unable, by the patient's family).  Yes   Patient/family informed of their freedom to choose among providers that offer the needed level of care, that participate in Medicare, Medicaid or managed care program needed by the patient, have an available bed and are willing to accept the patient.  Yes   Patient/family informed of [facility]'s ownership interest in [facility] and [facility], as well as of the fact that they are under no obligation to receive care at these facilities.  PASRR submitted to EDS on       PASRR number received on       Existing PASRR number confirmed on 05/07/15     FL2 transmitted to all facilities in geographic area requested by pt/family on 05/07/15     FL2 transmitted to all facilities within larger geographic area on       Patient informed that his/her managed care company has contracts with or will negotiate with certain facilities, including the following:        Yes   Patient/family informed of bed offers received.  Patient chooses bed at [facility]     Physician recommends and patient chooses bed at      Patient to be transferred to [facility] on  .  Patient to be transferred to facility by       Patient family notified on   of transfer.  Name of family member notified:        PHYSICIAN Please sign FL2, Please sign DNR     Additional Comment:     _______________________________________________ [name], LCSW 05/08/2015, 12:25 PM
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
## 2015-05-08 NOTE — Evaluation (Signed)
Clinical/Bedside Swallow Evaluation Patient Details  Name: Christopher Waller MRN: [id number] Date of Birth: 1925/02/14  Today's Date: 05/08/2015 Time: SLP Start Time (ACUTE ONLY): 1140 SLP Stop Time (ACUTE ONLY): 1154 SLP Time Calculation (min) (ACUTE ONLY): 14 min  Past Medical History:  Past Medical History  Diagnosis Date  . Coronary artery disease   . Dementia   . Diabetes mellitus without complication (HCC)   . Hyperlipidemia   . Ischemic cardiomyopathy 01/05/2015  . Biventricular implantable cardioverter-defibrillator medtronic  01/05/2015   Past Surgical History:  Past Surgical History  Procedure Laterality Date  . Pacemaker insertion    . Coronary artery bypass graft     HPI:  82 year old male with history of aortic stenosis, ischemic cardiomyopathy , pulmonary hypertension and associated diastolic heart failure, dyslipidemia ,dementia, and diabetes. living at home with home hospice, presents with fever, confusion, and jerking movements, workup significant for UTI , acute on chronic renal failure, hypertension, has not been taking his Depakote for myoclonic jerks .  Recently admitted to hospital and D/Cd home with hospice and comfort POs. Nephew feels pt now needs a higher level of care. Being followed by Palliative medicine during this admission.  RD note states meal completion is <30%.  Pt has been followed by SLP services during prior admissions.  Two MBS studies (01/06/15 and 04/22/15) revealed progressive dysphagia with severe pharyngeal residue, silent penetration and documented aspiration.  Per chart review, there were multiple conversations with nephew regarding the terminal and end-stage issues surrounding his dysphagia.   Assessment / Plan / Recommendation Clinical Impression  Pt has recent hx of deteriorating swallow function with documented aspiration per 04/22/15 MBS.  He is being followed by hospice, with comfort POs being offered.  Intake is poor per RD note.  During  assessment today, pt was willing to consume thin liquids but politely declined solids.  His symptoms are consistent with prior assessments, with likely intermittent aspiration and difficulty clearing pharynx of residuals.  Recommend a continued feeding approach to facilitate comfort and safety: 1) allow POs per his preference and respect his wishes when he declines further items from his tray; 2) allow pt to feed himself with hand-over-hand assist as able; 3) maintain elevated posture during PO intake and for 30 minutes after when able; 4) encourage slower rate; 5) initiate meal with nutrient-dense choices if pt will allow.  D/W RN.  If pt transitions to SNF, he may benefit from brief SLP f/u to allow education with family/staff re: the aforementioned precautions.  No further acute care needs are identified - will sign off.     Aspiration Risk  Severe aspiration risk    Diet Recommendation     Medication Administration: Crushed with puree    Other  Recommendations Oral Care Recommendations: Oral care BID   Follow up Recommendations  Skilled Nursing facility    Frequency and Duration            Prognosis Prognosis for Safe Diet Advancement: Guarded      Swallow Study   General Date of Onset:  (chronic) HPI: 82 year old male with history of aortic stenosis, ischemic cardiomyopathy , pulmonary hypertension and associated diastolic heart failure, dyslipidemia ,dementia, and diabetes. living at home with home hospice, presents with fever, confusion, and jerking movements, workup significant for UTI , acute on chronic renal failure, hypertension, has not been taking his Depakote for myoclonic jerks .  Recently admitted to hospital and D/Cd home with hospice and comfort POs. Nephew feels pt now needs  a higher level of care. Being followed by Palliative medicine during this admission.  RD note states meal completion is <30%.  Pt has been followed by SLP services during prior admissions.  Two MBS  studies (01/06/15 and 04/22/15) revealed progressive dysphagia with severe pharyngeal residue, silent penetration and documented aspiration.  Per chart review, there were multiple conversations with nephew regarding the terminal and end-stage issues surrounding his dysphagia. Type of Study: Bedside Swallow Evaluation Previous Swallow Assessment: see hx Diet Prior to this Study: Dysphagia 3 (soft);Thin liquids Temperature Spikes Noted: No Respiratory Status: Nasal cannula History of Recent Intubation: No Behavior/Cognition: Alert;Confused Oral Cavity Assessment: Within Functional Limits Oral Care Completed by SLP: No Oral Cavity - Dentition: Adequate natural dentition Vision: Functional for self-feeding Patient Positioning: Upright in chair Baseline Vocal Quality: Normal Volitional Cough: Strong Volitional Swallow: Unable to elicit    Oral/Motor/Sensory Function Overall Oral Motor/Sensory Function: Within functional limits   Ice Chips Ice chips: Not tested   Thin Liquid Thin Liquid: Impaired Presentation: Straw Pharyngeal  Phase Impairments: Multiple swallows;Throat Clearing - Delayed    Nectar Thick Nectar Thick Liquid: Not tested   Honey Thick Honey Thick Liquid: Not tested   Puree Puree: Not tested   Solid  Christopher Waller L. [place], [place] CCC/SLP Pager [phone]    Solid: Not tested        Christopher Waller 05/08/2015,12:12 PM

## 2015-05-08 NOTE — Progress Notes (Signed)
[facility] Progress Note   Christopher Waller  HYQ:[id number]  DOB: [birth year]  DOA: 05/04/2015 PCP: No PCP Per Patient  Brief narrative: Christopher Waller is a 82 y.o. male with ischemic cardiomyopathy, pulmonary hypertension, diastolic heart failure, dementia, diabetes mellitus and aortic stenosis who lives at home with nephew and is seen by hospice. He presents for generalized weakness, chills and worsening confusion and being disruptive throughout the night. He is found to have a UTI. Family stated that the patient had been recently started on amoxicillin for urinary tract infection but had only received 1 dose.  Her history and physical obtained by my partner, he was also noted to have increase in myoclonic jerking which is usually controlled with Depakote but for unclear reasons, he was not receiving his Depakote at home.   Subjective: The patient is confused to time place and situation. He has no specific complaints. No vomiting, nausea, abdominal pain, diarrhea, cough or shortness of breath.  Assessment/Plan: Principal Problem:   UTI (lower urinary tract infection) with urinary retention -Feeding urinary tract infection with Rocephin-urine culture is noncontributory as it has grown out multiple species -Foley catheter placed for urinary retention-will give voiding trial today -he has been started Flomax for possible prostatic hypertrophy  Active Problems:   Acute on chronic renal failure  -Resolved-possibly secondary to urinary retention and/or the use of Lasix - will stop IVF today to prevent fluid overload- cont to hold Lasix  Hypoglycemia -Noted on admission-he is not on any hypoglycemics at home-may have been secondary to UTI/sepsis -Sugars have improved with D5 which I will now continue    Myoclonic jerking -Resolved after resumption of Depakote    History of ischemic cardiomyopathy and diastolic heart failure -Last echo from last month showed a normal EF and was  technically not sufficient to delineate diastolic dysfunction -RV was noted to be moderately dilated with reduced systolic function and patient had moderate tricuspid with moderate pulmonary hypertension regurgitation -Lasix has been on hold due to acute renal failure  Moderate aortic stenosis -Noted on 2-D echo last month    Dementia - Oriented only to person    Coronary artery disease due to lipid rich plaque -Continuing baby aspirin  AICD  Antibiotics: Anti-infectives    Start     Dose/Rate Route Frequency Ordered Stop   05/05/15 0015  cefTRIAXone (ROCEPHIN) 1 g in dextrose 5 % 50 mL IVPB     1 g 100 mL/hr over 30 Minutes Intravenous Daily at bedtime 05/05/15 0007       Code Status:     Code Status Orders        Start     Ordered   05/05/15 0007  Do not attempt resuscitation (DNR)   Continuous    Question Answer Comment  In the event of cardiac or respiratory ARREST Do not call a "code blue"   In the event of cardiac or respiratory ARREST Do not perform Intubation, CPR, defibrillation or ACLS   In the event of cardiac or respiratory ARREST Use medication by any route, position, wound care, and other measures to relive pain and suffering. May use oxygen, suction and manual treatment of airway obstruction as needed for comfort.      05/05/15 0007    Code Status History    Date Active Date Inactive Code Status Order ID Comments User Context   04/20/2015  8:03 PM 04/25/2015  8:52 PM Partial Code [id number]  [name], NP Inpatient   04/11/2015  2:50  AM 04/15/2015  6:11 PM Partial Code [id number]  [name], MD Inpatient   01/06/2015  1:15 PM 01/08/2015  5:57 PM Partial Code [id number]  [name], MD Inpatient   01/04/2015  6:53 PM 01/06/2015  1:15 PM Full Code [id number]  [name], MD Inpatient     Family Communication:  Disposition Plan: Will either return home or will go to skilled nursing facility DVT prophylaxis:  Lovenox Consultants:   Procedures:     Objective: Filed Weights   05/05/15 0005  Weight: 58.514 kg (129 lb)    Intake/Output Summary (Last 24 hours) at 05/08/15 1410 Last data filed at 05/08/15 0752  Gross per 24 hour  Intake   1373 ml  Output    475 ml  Net    898 ml     Vitals Filed Vitals:   05/07/15 2148 05/07/15 2308 05/08/15 0019 05/08/15 0545  BP:  106/79  120/91  Pulse: 80 70  87  Temp:  98.1 F (36.7 C)  97.3 F (36.3 C)  TempSrc:  Axillary  Oral  Resp:  16  16  Height:    (1.702 m)   Weight:      SpO2:  98%  99%    Exam:  General:  Pt is alert, not in acute distress- confused to time and place  HEENT: No icterus, No thrush, oral mucosa moist  Cardiovascular: regular rate and rhythm, S1/S2 No murmur  Respiratory: clear to auscultation bilaterally   Abdomen: Soft, +Bowel sounds, non tender, non distended, no guarding  MSK: No cyanosis or clubbing- no pedal edema   Data Reviewed: Basic Metabolic Panel:  Recent Labs Lab 05/04/15 2018 05/05/15 0433 05/06/15 0434 05/07/15 0416 05/08/15 1027  NA 133* 136 134* 136 139  K 4.5 4.1 4.2 4.9 3.9  CL 93* 97* 99* 102 103  CO2 GLUCOSE 202* 113* 95 118* 253*  BUN 60* 64* 60* 49* 32*  CREATININE 2.37* 2.40* 1.82* 1.26* 0.94  CALCIUM 8.8* 8.4* 7.8* 8.0* 8.2*  MG  --  2.2  --   --   --   PHOS  --  3.3  --   --   --    Liver Function Tests:  Recent Labs Lab 05/04/15 2018 05/05/15 0433  AST 24 22  ALT 11* 12*  ALKPHOS 39 28*  BILITOT 0.8 0.9  PROT 6.4* 5.1*  ALBUMIN 2.7* 2.3*   No results for input(s): LIPASE, AMYLASE in the last 168 hours. No results for input(s): AMMONIA in the last 168 hours. CBC:  Recent Labs Lab 05/04/15 2018 05/05/15 0433  WBC 7.4 6.5  NEUTROABS 4.5  --   HGB 12.4* 10.8*  HCT 38.0* 33.5*  MCV 95.5 97.4  PLT 249 206   Cardiac Enzymes:  Recent Labs Lab 05/04/15 2018  TROPONINI 0.06*   BNP (last 3 results)  Recent Labs  04/20/15 1254  BNP 303.0*     ProBNP (last 3 results) No results for input(s): PROBNP in the last 8760 hours.  CBG:  Recent Labs Lab 05/07/15 0605 05/07/15 0724 05/07/15 1138 05/07/15 1641 05/08/15 1156  GLUCAP 85 82 178* 264* 222*    Recent Results (from the past 240 hour(s))  Urine culture     Status: None   Collection Time: 05/04/15  7:59 PM  Result Value Ref Range Status   Specimen Description URINE, CATHETERIZED  Final   Special Requests NONE  Final   Culture  Final    MULTIPLE SPECIES PRESENT, SUGGEST RECOLLECTION Performed at [facility]    Report Status 05/06/2015 FINAL  Final     Studies: No results found.  Scheduled Meds:  Scheduled Meds: . antiseptic oral rinse  7 mL Mouth Rinse BID  . antiseptic oral rinse  7 mL Mouth Rinse BID  . aspirin EC  81 mg Oral Q breakfast  . cefTRIAXone (ROCEPHIN) IVPB 1 gram/50 mL D5W  1 g Intravenous QHS  . divalproex  250 mg Oral 3 times per day  . feeding supplement (ENSURE ENLIVE)  237 mL Oral TID BM  . megestrol  400 mg Oral BID  . metoprolol tartrate  12.5 mg Oral BID  . tamsulosin  0.4 mg Oral QPC supper   Continuous Infusions: . dextrose 5 % and 0.9% NaCl 50 mL/hr at 05/08/15 0853    Time spent on care of this patient: 35 min   Davy Faught, MD 05/08/2015, 2:10 PM  LOS: 2 days   [facility] Office  [phone] Pager - Text Page per [email or web address] If 7PM-7AM, please contact night-coverage [email or web address]

## 2015-05-08 NOTE — Progress Notes (Addendum)
CSW continuing to follow.   CSW followed up with pt nephew, [name] via telephone. PT was able to see pt again today and pt was able to participate better than yesterday and recommendation changed to SNF.   CSW contacted pt nephew via telephone to discuss. Pt nephew wants pt to go to SNF to see how pt progresses and hopeful that pt may get some strength back in order to return home with hospice and pt care needs be more manageable for family. CSW reviewed SNF bed offers. Pt nephew reports that pt has been to [facility] in the past and pt family was pleased with the care at facility. Pt nephew chooses [facility] and [address].  Per MD, anticipate discharge tomorrow.   CSW contacted [facility] and confirmed facility could accept pt tomorrow. CSW updated pt nephew.   CSW to facilitate pt discharge to [facility] with palliative care services to follow anticipated for tomorrow.  Loletta Specter, MSW, LCSW Clinical Social Work [phone]

## 2015-05-09 DIAGNOSIS — R339 Retention of urine, unspecified: Secondary | ICD-10-CM

## 2015-05-09 LAB — GLUCOSE, CAPILLARY
GLUCOSE-CAPILLARY: 162 mg/dL — AB (ref 65–99)
Glucose-Capillary: 171 mg/dL — ABNORMAL HIGH (ref 65–99)

## 2015-05-09 MED ORDER — SODIUM CHLORIDE 0.9 % IV SOLN
INTRAVENOUS | Status: DC | PRN
  Administered 2015-05-09: 250 mL via INTRAVENOUS

## 2015-05-09 MED ORDER — DIVALPROEX SODIUM 125 MG PO CSDR: 250.0000 mg | DELAYED_RELEASE_CAPSULE | Freq: Three times a day (TID) | ORAL | Status: AC

## 2015-05-09 MED ORDER — TAMSULOSIN HCL 0.4 MG PO CAPS
0.4000 mg | ORAL_CAPSULE | Freq: Every day | ORAL | Status: AC
Start: 2015-05-09 — End: ?

## 2015-05-09 MED ORDER — ENSURE ENLIVE PO LIQD: 237.0000 mL | Freq: Three times a day (TID) | ORAL | Status: AC

## 2015-05-09 MED ORDER — CEFUROXIME AXETIL 500 MG PO TABS: 500.0000 mg | ORAL_TABLET | Freq: Two times a day (BID) | ORAL | Status: AC

## 2015-05-09 NOTE — Progress Notes (Signed)
Inpatient Diabetes Program Recommendations  AACE/ADA: New Consensus Statement on Inpatient Glycemic Control (2015)  Target Ranges:  Prepandial:   less than 140 mg/dL      Peak postprandial:   less than 180 mg/dL (1-2 hours)      Critically ill patients:  140 - 180 mg/dL   Results for GREGG, WINCHELL (MRN [id number]) as of 05/09/2015 09:47  Ref. Range 05/08/2015 11:56 05/08/2015 17:12 05/08/2015 21:49  Glucose-Capillary Latest Ref Range: 65-99 mg/dL 712 (H) 458 (H) 099 (H)    Home DM Meds: None  Current Insulin Orders: None    MD- If within goals of care for this patient:  Please consider starting Novolog Sensitive SSI (0-9 units) TID AC + HS     --Will follow patient during hospitalization--  Ambrose Finland RN, MSN, CDE Diabetes Coordinator Inpatient Glycemic Control Team Team Pager: [phone] (8a-5p)

## 2015-05-09 NOTE — Progress Notes (Signed)
Pt discharge to [facility].  CSW facilitated pt discharge needs including contacting facility, faxing pt discharge information via epic hub, discussed with pt nephew, [name] via telephone, providing RN phone number to call report, and arranging ambulance transport for pt to [name].  No further social work needs identified at this time.  CSW signing off.   Loletta Specter, MSW, LCSW Clinical Social Work [phone]

## 2015-05-09 NOTE — Clinical Social Work Placement (Signed)
   CLINICAL SOCIAL WORK PLACEMENT  NOTE  Date:  05/09/2015  Patient Details  Name: Christopher Waller MRN: [id number] Date of Birth: 1925-03-05  Clinical Social Work is seeking post-discharge placement for this patient at the Skilled  Nursing Facility level of care (*CSW will initial, date and re-position this form in  chart as items are completed):  Yes   Patient/family provided with  Clinical Social Work Department's list of facilities offering this level of care within the geographic area requested by the patient (or if unable, by the patient's family).  Yes   Patient/family informed of their freedom to choose among providers that offer the needed level of care, that participate in Medicare, Medicaid or managed care program needed by the patient, have an available bed and are willing to accept the patient.  Yes   Patient/family informed of 's ownership interest in [facility] and [facility], as well as of the fact that they are under no obligation to receive care at these facilities.  PASRR submitted to EDS on       PASRR number received on       Existing PASRR number confirmed on 05/07/15     FL2 transmitted to all facilities in geographic area requested by pt/family on 05/07/15     FL2 transmitted to all facilities within larger geographic area on       Patient informed that his/her managed care company has contracts with or will negotiate with certain facilities, including the following:        Yes   Patient/family informed of bed offers received.  Patient chooses bed at [facility]     Physician recommends and patient chooses bed at      Patient to be transferred to [facility] on 05/09/15.  Patient to be transferred to facility by ambulance [facility] Mons)     Patient family notified on 05/09/15 of transfer.  Name of family member notified:  pt nephew, Christopher Waller notified via telephone     PHYSICIAN Please sign FL2,  Please sign DNR     Additional Comment:    _______________________________________________ [name], LCSW 05/09/2015, 4:23 PM

## 2015-05-09 NOTE — Care Management Note (Addendum)
Case Management Note  Patient Details  Name: Tuck Dulworth MRN: [id number] Date of Birth: 1924/06/07  Subjective/Objective:              82 yo admitted with UTI      Action/Plan: From home with nephew [name] and his wife.  Plan to go to SNF at DC.  Expected Discharge Date:                  Expected Discharge Plan:  Skilled Nursing Facility  In-House Referral:  Clinical Social Work  Discharge planning Services  CM Consult  Post Acute Care Choice:    Choice offered to:     DME Arranged:    DME Agency:     HH Arranged:    HH Agency:     Status of Service:  Completed, signed off  Medicare Important Message Given:  Yes Date Medicare IM Given:    Medicare IM give by:    Date Additional Medicare IM Given:    Additional Medicare Important Message give by:     If discussed at Long Length of Stay Meetings, dates discussed:    Additional CommentsBartholome Bill, RN 05/09/2015, 1:27 PM [phone]

## 2015-05-09 NOTE — Progress Notes (Signed)
Patient has noted voided since foley discontinued. Bladder scans showing >200 ml in bladder. Paged NP orders received. At 0430 bladder scan shows >200 ml. In and out cath done yielding 750 ml of urine.

## 2015-05-09 NOTE — Care Management Important Message (Signed)
Important Message  Patient Details  Name: Christopher Waller MRN: [id number] Date of Birth: 11/21/1924   Medicare Important Message Given:  Yes    Haskell Flirt 05/09/2015, 10:26 AMImportant Message  Patient Details  Name: Christopher Waller MRN: [id number] Date of Birth: 09-09-1924   Medicare Important Message Given:  Yes    Haskell Flirt 05/09/2015, 10:25 AM

## 2015-05-09 NOTE — Discharge Summary (Signed)
Physician Discharge Summary  [facility] NGE:[id number] DOB: 08/15/1924 DOA: 05/04/2015  PCP: No PCP Per Patient  Admit date: 05/04/2015 Discharge date: 05/09/2015  Time spent: 50 minutes  Recommendations for Outpatient Follow-up:  1. Monitor closely for urinary retention-I and O cath done at 6 AM - will give one more opportunity to void but if he is unable to, would recommend re-inserting foley at SNF 2. Lasix on hold due to acute renal failure and possibility of this being  prerenal- monitor for fluid overload  Discharge Condition: stable    Discharge Diagnoses:  Principal Problem:   UTI (lower urinary tract infection) Active Problems:   Acute on chronic renal failure (HCC)   Myoclonic jerking   History of ischemic cardiomyopathy   Dementia   Diabetes mellitus, stable (HCC)   Coronary artery disease due to lipid rich plaque   History of present illness:  Christopher Waller is a 82 y.o. male with ischemic cardiomyopathy, pulmonary hypertension, diastolic heart failure, dementia, diabetes mellitus and aortic stenosis who lives at home with nephew and is seen by hospice. He presents for generalized weakness, chills and worsening confusion and being disruptive throughout the night. He is found to have a UTI. Family stated that the patient had been recently started on amoxicillin for urinary tract infection but had only received 1 dose. Her history and physical obtained by my partner, he was also noted to have increase in myoclonic jerking which is usually controlled with Depakote but for unclear reasons, he was not receiving his Depakote at home.  Hospital Course:  Principal Problem:  UTI (lower urinary tract infection) with urinary retention -Treating urinary tract infection with Rocephin-urine culture is noncontributory as it has grown out multiple species therefore will switch to Ceftin to complete a 7 day course -Foley catheter placed for urinary retention-will given voiding trial  today but needed to have I and O cath done at 6 AM - will give one more opportunity to void but if he is unable to, would recommend re-inserting foley -he has been started Flomax for possible prostatic hypertrophy  Active Problems:  Acute on chronic renal failure  -Resolved with IV fluid hydration-possibly secondary to urinary retention and/or the use of Lasix - cont to hold Lasix for now  Hypoglycemia - sugars dropped to 40s on 2/6- he was on an insulin sliding scale which could have caused this -Noted on admission-he is not on any hypoglycemics at home-may have been secondary to UTI/sepsis? -Sugars have improved with D5 which I will now continue   Myoclonic jerking -Resolved after resumption of Depakote   History of ischemic cardiomyopathy and diastolic heart failure -Last echo from last month showed a normal EF and was technically not sufficient to delineate diastolic dysfunction -RV was noted to be moderately dilated with reduced systolic function and patient had moderate tricuspid with moderate pulmonary hypertension regurgitation -Lasix has been on hold due to acute renal failure  Moderate aortic stenosis -Noted on 2-D echo last month   Dementia - Oriented only to person   Coronary artery disease due to lipid rich plaque -Continuing baby aspirin  AICD  Procedures:   Foley, I and O cath  Consultations:     Discharge Exam: Filed Weights   05/05/15 0005  Weight: 58.514 kg (129 lb)   Filed Vitals:   05/09/15 1000 05/09/15 1035  BP: 111/59 111/59  Pulse: 76 76  Temp:    Resp:      General: AAO x 3, no distress Cardiovascular: RRR,  no murmurs  Respiratory: clear to auscultation bilaterally GI: soft, non-tender, non-distended, bowel sound positive  Discharge Instructions You were cared for by a hospitalist during your hospital stay. If you have any questions about your discharge medications or the care you received while you were in the hospital after  you are discharged, you can call the unit and asked to speak with the hospitalist on call if the hospitalist that took care of you is not available. Once you are discharged, your primary care physician will handle any further medical issues. Please note that NO REFILLS for any discharge medications will be authorized once you are discharged, as it is imperative that you return to your primary care physician (or establish a relationship with a primary care physician if you do not have one) for your aftercare needs so that they can reassess your need for medications and monitor your lab values.      Discharge Instructions    Discharge instructions    Complete by:  As directed   Regular diet     Increase activity slowly    Complete by:  As directed             Medication List    TAKE these medications        aspirin 81 MG tablet  Take 81 mg by mouth daily with breakfast. Reported on 05/04/2015     cefUROXime 500 MG tablet  Commonly known as:  CEFTIN  Take 1 tablet (500 mg total) by mouth 2 (two) times daily with a meal.     divalproex 125 MG capsule  Commonly known as:  DEPAKOTE SPRINKLE  Take 2 capsules (250 mg total) by mouth every 8 (eight) hours.     feeding supplement (ENSURE ENLIVE) Liqd  Take 237 mLs by mouth 3 (three) times daily between meals.     fenofibrate 145 MG tablet  Commonly known as:  TRICOR  Take 145 mg by mouth at bedtime. Reported on 05/04/2015     tamsulosin 0.4 MG Caps capsule  Commonly known as:  FLOMAX  Take 1 capsule (0.4 mg total) by mouth daily after supper.       No Known Allergies    The results of significant diagnostics from this hospitalization (including imaging, microbiology, ancillary and laboratory) are listed below for reference.    Significant Diagnostic Studies: Dg Chest 2 View  04/21/2015  CLINICAL DATA:  Acute diastolic heart failure EXAM: CHEST  2 VIEW COMPARISON:  04/20/2015 FINDINGS: Cardiomegaly again noted. Three leads cardiac  pacemaker is unchanged in position. Persistent mild interstitial prominence bilateral probable mild interstitial edema. Again noted small bilateral pleural effusion with bilateral basilar atelectasis. Atherosclerotic calcifications of thoracic aorta. Osteopenia and degenerative changes thoracolumbar spine. Stable compression deformity upper lumbar spine. IMPRESSION: Three leads cardiac pacemaker is unchanged in position. Persistent mild interstitial prominence bilateral probable mild interstitial edema. Again noted small bilateral pleural effusion with bilateral basilar atelectasis. Electronically Signed   By: Natasha Mead M.D.   On: 04/21/2015 09:22   Dg Chest 2 View  04/20/2015  CLINICAL DATA:  82 year old male with wheezing, shortness of breath chest EXAM: CHEST  2 VIEW COMPARISON:  Prior chest x-ray 04/13/2015 FINDINGS: Stable position of left subclavian approach biventricular cardiac rhythm maintenance device. The leads project over the right atrium, right ventricle and overlying the left ventricle. Cardiac and mediastinal contours are unchanged. Atherosclerotic calcifications present in the transverse aorta. Patient is status post median sternotomy. Slightly increased pulmonary vascular congestion without overt  edema. No pneumothorax. Small bilateral pleural effusions. Compression deformity of L1. No acute osseous abnormality. IMPRESSION: 1. Slightly increased pulmonary vascular congestion bordering on mild edema. 2. Small bilateral pleural effusions. 3. Stable cardiomegaly. 4. Age indeterminate L1 compression fracture.  Favor remote. Electronically Signed   By: Malachy Moan M.D.   On: 04/20/2015 13:48   Ct Soft Tissue Neck W Contrast  04/20/2015  CLINICAL DATA:  82 year old diabetic male with dementia coughing or choking to the large secretions. Initial encounter. EXAM: CT NECK WITH CONTRAST TECHNIQUE: Multidetector CT imaging of the neck was performed using the standard protocol following the bolus  administration of intravenous contrast. CONTRAST:  75mL OMNIPAQUE IOHEXOL 300 MG/ML  SOLN COMPARISON:  None. FINDINGS: Pharynx and larynx: Negative. Salivary glands: Negative. Thyroid: Negative. Lymph nodes: No adenopathy. Vascular: Significant atherosclerotic changes including bilateral carotid bifurcation calcification with 62% diameter stenosis proximal right internal carotid artery and 64% diameter stenosis proximal left internal carotid artery. Moderate to marked narrowing right vertebral artery and mild to moderate narrowing left vertebral artery at the level of foramen magnum. Limited intracranial: Atrophy and small vessel disease changes. Visualized orbits: Limited evaluation and unremarkable. Mastoids and visualized paranasal sinuses: Minimal polypoid opacification inferior right maxillary sinus. Skeleton: Degenerative changes cervical spine most notable C3-4 and C5-6 with fusion C4-5. Remote anterior wedge compression deformity T2 with 50% loss height anteriorly. Upper chest: Small to slightly moderate-size left-sided and small right-sided pleural effusion with adjacent mild atelectasis. Pacemaker in place. IMPRESSION: Exam limited by motion and dental artifact without pharyngeal mass noted. Bilateral carotid bifurcation calcification with 62% diameter stenosis proximal right internal carotid artery and 64% diameter stenosis proximal left internal carotid artery. Moderate to marked narrowing right vertebral artery and mild to moderate narrowing left vertebral artery at the level of foramen magnum. Degenerative changes cervical spine most notable C3-4 and C5-6 with fusion C4-5. Remote anterior wedge compression deformity T2 with 50% loss height anteriorly. Small to slightly moderate-size left-sided and small right-sided pleural effusion with adjacent mild atelectasis. Electronically Signed   By: Lacy Duverney M.D.   On: 04/20/2015 15:20   Dg Chest Portable 1 View  05/04/2015  CLINICAL DATA:  Short of  breath EXAM: PORTABLE CHEST 1 VIEW COMPARISON:  04/21/2015 FINDINGS: AICD remains in good position. Negative for heart failure. Negative for pneumonia or effusion. Lungs are clear with improved aeration since prior study IMPRESSION: No active disease. Electronically Signed   By: Marlan Palau M.D.   On: 05/04/2015 19:45   Dg Chest Port 1 View  04/13/2015  CLINICAL DATA:  Pneumonia.  Short of breath. EXAM: PORTABLE CHEST 1 VIEW COMPARISON:  04/11/2015 FINDINGS: Heart remains mildly enlarged. Left subclavian AICD device is stable. Sternal wires are stable. Lungs under aerated and grossly clear. No pneumothorax. IMPRESSION: No active disease. Electronically Signed   By: Jolaine Click M.D.   On: 04/13/2015 10:21   Dg Chest Port 1 View  04/11/2015  CLINICAL DATA:  82 year old male with hypotension EXAM: PORTABLE CHEST 1 VIEW COMPARISON:  None. FINDINGS: Single-view of the chest does not demonstrate a focal consolidation. There is no pleural effusion or pneumothorax. Mild cardiomegaly. Left pectoral AICD device. Median sternotomy wires and CABG vascular clips. The osseous structures are grossly unremarkable with IMPRESSION: No acute cardiopulmonary process. Electronically Signed   By: Elgie Collard M.D.   On: 04/11/2015 01:36   Dg Swallowing Func-speech Pathology  04/22/2015  Objective Swallowing Evaluation:   MBS Patient Details Name: Lukka Black MRN: [id number] Date of  Birth: 11-17-1924 Today's Date: 04/22/2015 Time: SLP Start Time (ACUTE ONLY): 0818-SLP Stop Time (ACUTE ONLY): 0837 SLP Time Calculation (min) (ACUTE ONLY): 19 min Past Medical History: Past Medical History Diagnosis Date . Coronary artery disease  . Dementia  . Diabetes mellitus without complication (HCC)  . Hyperlipidemia  . Ischemic cardiomyopathy 01/05/2015 . Biventricular implantable cardioverter-defibrillator medtronic  01/05/2015 Past Surgical History: Past Surgical History Procedure Laterality Date . Pacemaker insertion   . Coronary  artery bypass graft   HPI: Pt is an 82 y.o. male with PMH of aortic stenosis, history of ischemic cardiomyopathy, significant pulmonary hypertension and associated diastolic heart failure, dyslipidemia and dementia and diabetes. Per chart, pt was discharged on 1/16 after an admission for syncope and collapse in setting of bradycardia, hypoglycemia and hypotension. Admitted 1/21 for shortness of breath and gurgling in throat. CXR 1/22 showed bilateral pleural effusions with bilateral basilar atelectasis. Pt had MBS 01/06/15 which showed mild oral, moderate-severe pharyngeal dysphagia with suspected esophageal component- premature spill with large amount of pharyngeal penetration and poor sensation, also poor laryngeal elevation, tongue base retraction, and pharyngeal contraction. Dysphagia 3/ thin liquids were recommended. MBS recommended given history. No Data Recorded Assessment / Plan / Recommendation CHL IP CLINICAL IMPRESSIONS 04/22/2015 Therapy Diagnosis Severe pharyngeal phase dysphagia;Moderate pharyngeal phase dysphagia;Mild oral phase dysphagia Clinical Impression Pt's swallow function has deteriorated from MBS in 12/2014. Tongue base retraction, laryngeal elevation significantly decreased resulting in mod-max vallecular and pyriform sinus residue. Therapeutic strategies were unsuccessful to decrease pharyngeal residue to a safe amount and pt silently penetrated and aspirated in between and during swallows. Thin and nectar thick barium yielded similar volumes of pharyngeal residue. Brief esophageal scan did not reveal overt abnormalities (MBS does not diagnose below level of UES). Pt has chronic dysphagia and currently is at high risk with all consistencies. Results discussed with Dr. Susie Cassette who stated she will discuss wishes for nutrition with family. Will follow up for any further education.       Impact on safety and function Severe aspiration risk   CHL IP TREATMENT RECOMMENDATION 01/06/2015 Treatment  Recommendations Therapy as outlined in treatment plan below   Prognosis 01/06/2015 Prognosis for Safe Diet Advancement Good Barriers to Reach Goals Cognitive deficits;Time post onset Barriers/Prognosis Comment -- CHL IP DIET RECOMMENDATION 04/22/2015 SLP Diet Recommendations NPO Liquid Administration via -- Medication Administration -- Compensations -- Postural Changes --   CHL IP OTHER RECOMMENDATIONS 04/22/2015 Recommended Consults -- Oral Care Recommendations Oral care QID Other Recommendations --   CHL IP FOLLOW UP RECOMMENDATIONS 04/22/2015 Follow up Recommendations None   CHL IP FREQUENCY AND DURATION 01/06/2015 Speech Therapy Frequency (ACUTE ONLY) min 2x/week Treatment Duration 2 weeks      CHL IP ORAL PHASE 04/22/2015 Oral Phase Impaired Oral - Pudding Teaspoon -- Oral - Pudding Cup -- Oral - Honey Teaspoon -- Oral - Honey Cup -- Oral - Nectar Teaspoon -- Oral - Nectar Cup -- Oral - Nectar Straw -- Oral - Thin Teaspoon -- Oral - Thin Cup -- Oral - Thin Straw -- Oral - Puree -- Oral - Mech Soft -- Oral - Regular Delayed oral transit;Weak lingual manipulation Oral - Multi-Consistency -- Oral - Pill -- Oral Phase - Comment --  CHL IP PHARYNGEAL PHASE 04/22/2015 Pharyngeal Phase Impaired Pharyngeal- Pudding Teaspoon -- Pharyngeal -- Pharyngeal- Pudding Cup -- Pharyngeal -- Pharyngeal- Honey Teaspoon -- Pharyngeal -- Pharyngeal- Honey Cup -- Pharyngeal -- Pharyngeal- Nectar Teaspoon -- Pharyngeal -- Pharyngeal- Nectar Cup Pharyngeal residue - valleculae;Pharyngeal residue -  pyriform;Reduced tongue base retraction;Reduced laryngeal elevation;Penetration/Aspiration during swallow Pharyngeal Material enters airway, CONTACTS cords and not ejected out Pharyngeal- Nectar Straw -- Pharyngeal -- Pharyngeal- Thin Teaspoon Delayed swallow initiation-pyriform sinuses;Pharyngeal residue - valleculae;Pharyngeal residue - pyriform;Reduced tongue base retraction;Reduced laryngeal elevation Pharyngeal -- Pharyngeal- Thin Cup Delayed  swallow initiation-pyriform sinuses;Pharyngeal residue - valleculae;Pharyngeal residue - pyriform;Reduced tongue base retraction;Reduced laryngeal elevation Pharyngeal -- Pharyngeal- Thin Straw -- Pharyngeal -- Pharyngeal- Puree Penetration/Aspiration during swallow;Pharyngeal residue - valleculae;Pharyngeal residue - pyriform;Reduced laryngeal elevation;Reduced tongue base retraction Pharyngeal Material enters airway, passes BELOW cords then ejected out;Material enters airway, remains ABOVE vocal cords and not ejected out Pharyngeal- Mechanical Soft -- Pharyngeal -- Pharyngeal- Regular Pharyngeal residue - valleculae;Pharyngeal residue - pyriform;Reduced tongue base retraction;Reduced laryngeal elevation Pharyngeal -- Pharyngeal- Multi-consistency -- Pharyngeal -- Pharyngeal- Pill -- Pharyngeal -- Pharyngeal Comment --  CHL IP CERVICAL ESOPHAGEAL PHASE 04/22/2015 Cervical Esophageal Phase WFL Pudding Teaspoon -- Pudding Cup -- Honey Teaspoon -- Honey Cup -- Nectar Teaspoon -- Nectar Cup -- Nectar Straw -- Thin Teaspoon -- Thin Cup -- Thin Straw -- Puree -- Mechanical Soft -- Regular -- Multi-consistency -- Pill -- Cervical Esophageal Comment -- CHL IP GO 04/22/2015 Functional Assessment Tool Used skilled clinical judgement Functional Limitations Swallowing Swallow Current Status (R6045) CL Swallow Goal Status (W0981) CK Swallow Discharge Status (X9147) (None) Motor Speech Current Status (W2956) (None) Motor Speech Goal Status (O1308) (None) Motor Speech Goal Status (M5784) (None) Spoken Language Comprehension Current Status (O9629) (None) Spoken Language Comprehension Goal Status (B2841) (None) Spoken Language Comprehension Discharge Status (L2440) (None) Spoken Language Expression Current Status (N0272) (None) Spoken Language Expression Goal Status (Z3664) (None) Spoken Language Expression Discharge Status [phone]) (None) Attention Current Status (Q2595) (None) Attention Goal Status (G3875) (None) Attention Discharge  Status (I4332) (None) Memory Current Status (R5188) (None) Memory Goal Status (C1660) (None) Memory Discharge Status (Y3016) (None) Voice Current Status (W1093) (None) Voice Goal Status (A3557) (None) Voice Discharge Status (D2202) (None) Other Speech-Language Pathology Functional Limitation [phone]) (None) Other Speech-Language Pathology Functional Limitation Goal Status (W2376) (None) Other Speech-Language Pathology Functional Limitation Discharge Status [phone]) (None) Royce Macadamia 04/22/2015, 10:32 AM Breck Coons Lonell Face.Ed CCC-SLP Pager [phone]               Microbiology: Recent Results (from the past 240 hour(s))  Urine culture     Status: None   Collection Time: 05/04/15  7:59 PM  Result Value Ref Range Status   Specimen Description URINE, CATHETERIZED  Final   Special Requests NONE  Final   Culture   Final    MULTIPLE SPECIES PRESENT, SUGGEST RECOLLECTION Performed at [facility]    Report Status 05/06/2015 FINAL  Final     Labs: Basic Metabolic Panel:  Recent Labs Lab 05/04/15 2018 05/05/15 0433 05/06/15 0434 05/07/15 0416 05/08/15 1027  NA 133* 136 134* 136 139  K 4.5 4.1 4.2 4.9 3.9  CL 93* 97* 99* 102 103  CO2 GLUCOSE 202* 113* 95 118* 253*  BUN 60* 64* 60* 49* 32*  CREATININE 2.37* 2.40* 1.82* 1.26* 0.94  CALCIUM 8.8* 8.4* 7.8* 8.0* 8.2*  MG  --  2.2  --   --   --   PHOS  --  3.3  --   --   --    Liver Function Tests:  Recent Labs Lab 05/04/15 2018 05/05/15 0433  AST 24 22  ALT 11* 12*  ALKPHOS 39 28*  BILITOT 0.8 0.9  PROT 6.4* 5.1*  ALBUMIN 2.7* 2.3*   No results for input(s): LIPASE, AMYLASE in the last 168 hours. No results for input(s): AMMONIA in the last 168 hours. CBC:  Recent Labs Lab 05/04/15 2018 05/05/15 0433  WBC 7.4 6.5  NEUTROABS 4.5  --   HGB 12.4* 10.8*  HCT 38.0* 33.5*  MCV 95.5 97.4  PLT 249 206   Cardiac Enzymes:  Recent Labs Lab 05/04/15 2018  TROPONINI 0.06*   BNP: BNP (last 3  results)  Recent Labs  04/20/15 1254  BNP 303.0*    ProBNP (last 3 results) No results for input(s): PROBNP in the last 8760 hours.  CBG:  Recent Labs Lab 05/08/15 1156 05/08/15 1712 05/08/15 2149 05/09/15 0737 05/09/15 1138  GLUCAP 222* 283* 210* 171* 162*       SignedCalvert Cantor, MD [facility] 05/09/2015, 12:32 PM

## 2015-06-29 DEATH — deceased

## 2017-02-21 IMAGING — CR DG CHEST 1V PORT
1 series · 1 of 1 positions shown · non-contrast
Comparison: None.

CLINICAL DATA: 89-year-old male with hypotension

EXAM:
PORTABLE CHEST 1 VIEW

[AP]
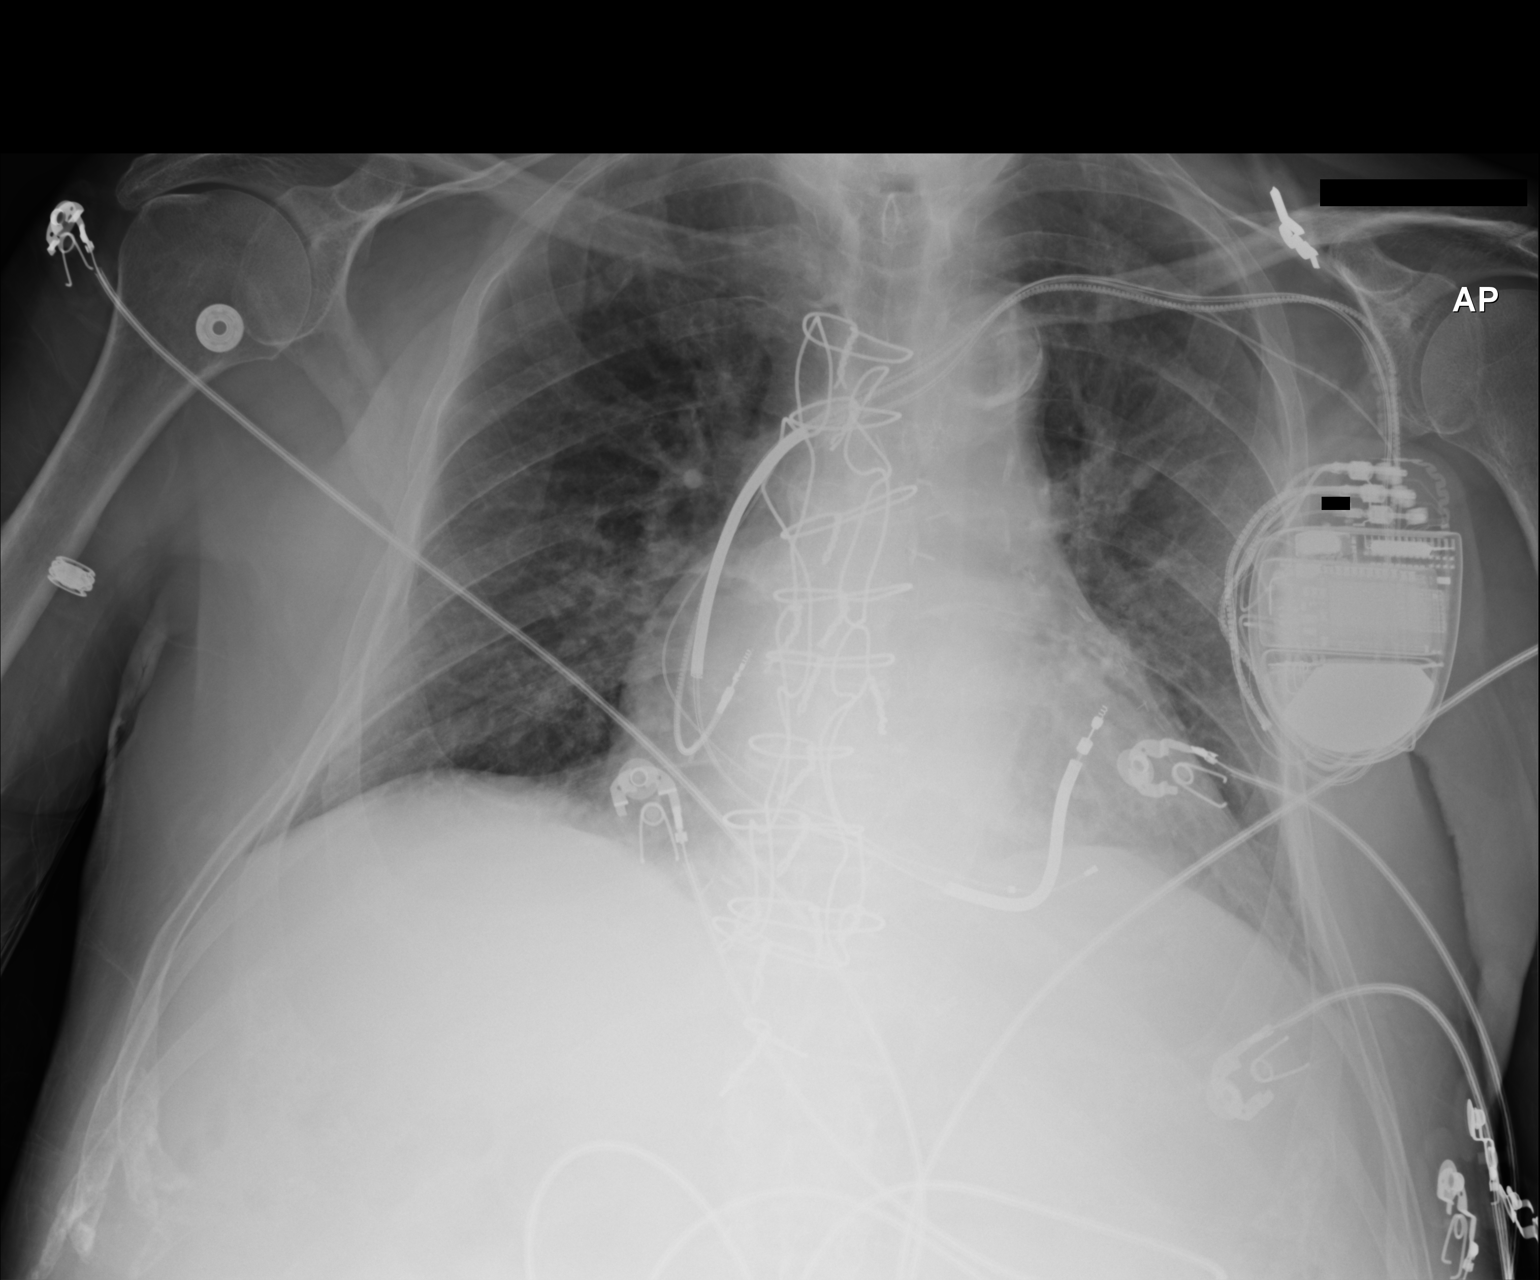

[1 of 1 positions shown; findings below may reference images not displayed]

FINDINGS: Single-view of the chest does not demonstrate a focal consolidation.
There is no pleural effusion or pneumothorax. Mild cardiomegaly.
Left pectoral AICD device. Median sternotomy wires and CABG vascular
clips. The osseous structures are grossly unremarkable with
IMPRESSION: No acute cardiopulmonary process.

## 2017-03-02 IMAGING — CT CT NECK W/ CM
4 of 5 series · 15 of 33 positions shown, 17 images · IV contrast (APPLIED)
Comparison: None.

CLINICAL DATA: 89-year-old diabetic male with dementia coughing or
choking to the large secretions. Initial encounter.

EXAM:
CT NECK WITH CONTRAST
TECHNIQUE: Multidetector CT imaging of the neck was performed using the
standard protocol following the bolus administration of intravenous
contrast.
CONTRAST:  75mL OMNIPAQUE IOHEXOL 300 MG/ML  SOLN

[Series 2: neck 2.0 i31s 3 · axial · 0.49mm/px · z∈[-214,-96]mm · 3 of 119 slices shown, 4 images]
[im 30/119  soft-tissue]
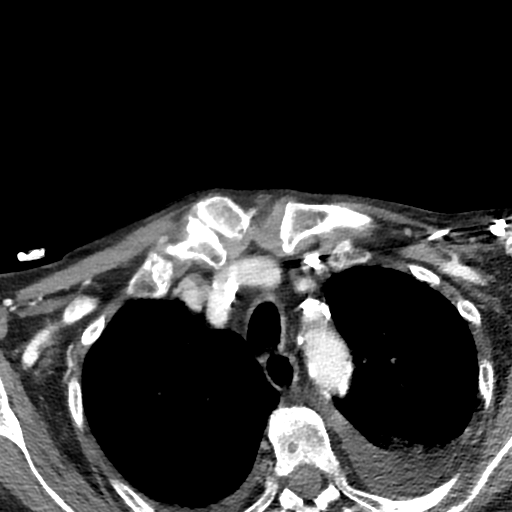
[im 30/119  bone]
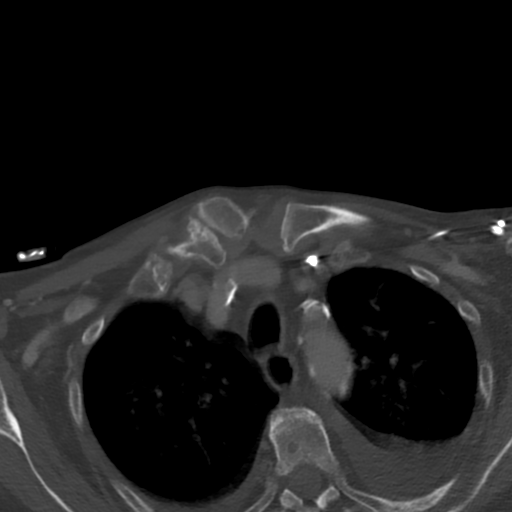
[im 60/119  bone]
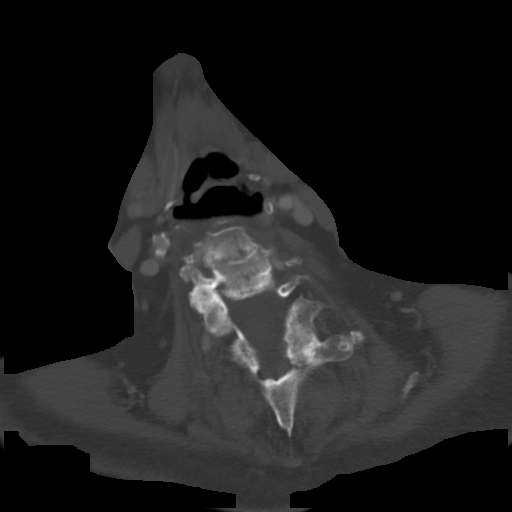
[im 89/119  bone]
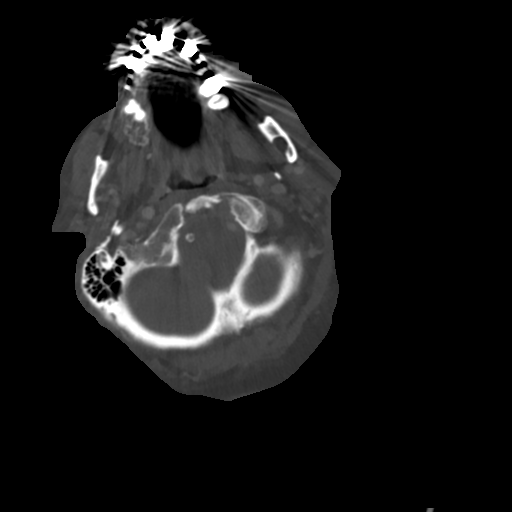

[Series 5: coronal st · coronal · 0.39mm/px · 3 of 101 slices shown]
[im 28/101  bone]
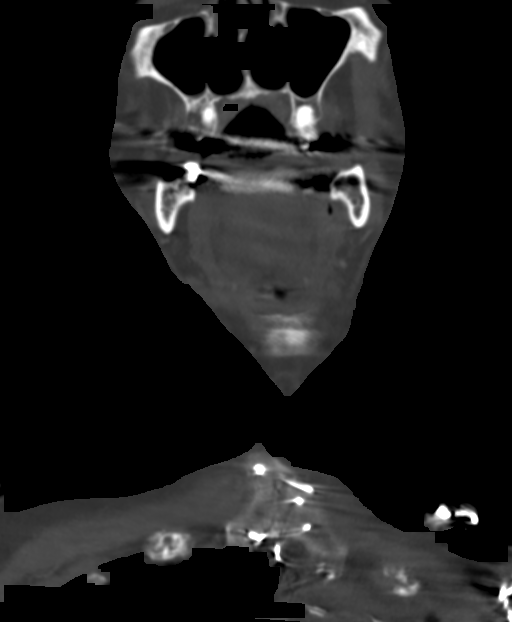
[im 43/101  bone]
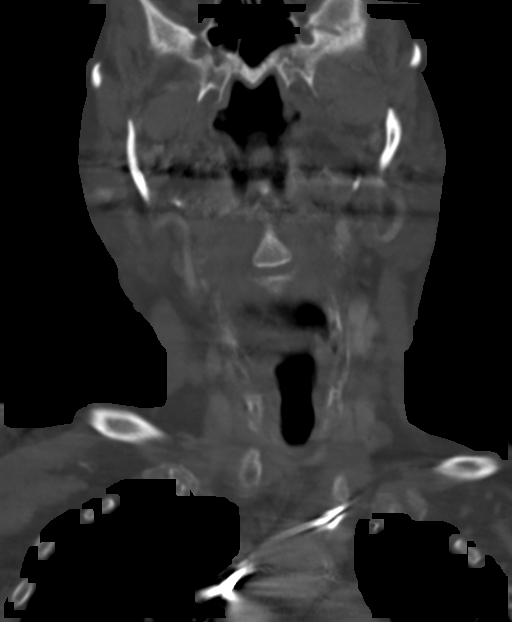
[im 58/101  bone]
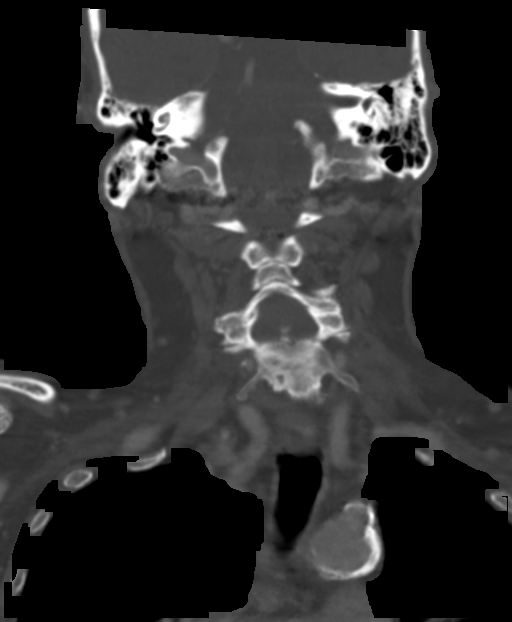

[Series 6: sagittal st · sagittal · 0.44mm/px · 5 of 101 slices shown, 6 images]
[im 34/101  bone]
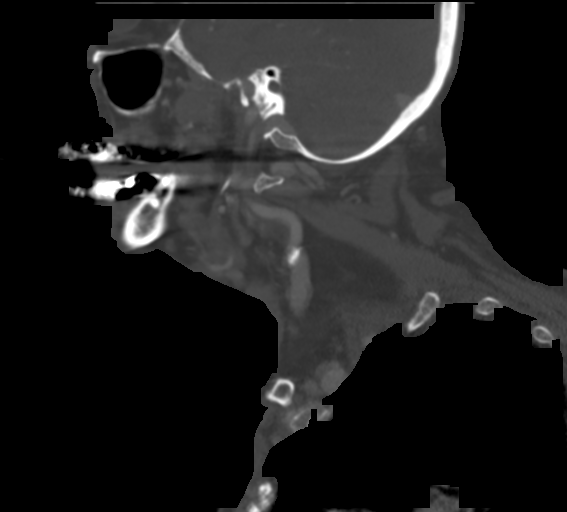
[im 42/101  bone]
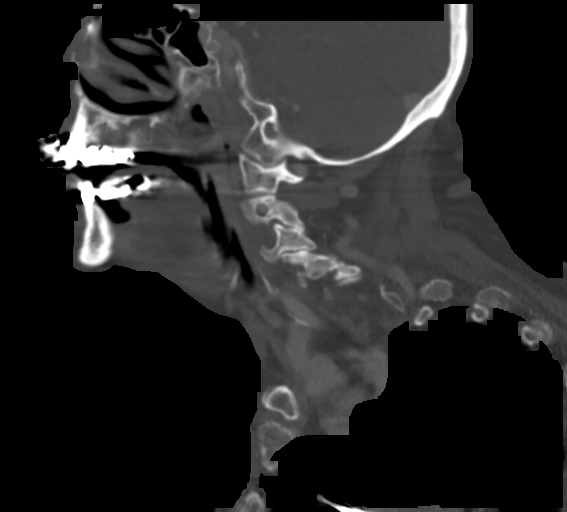
[im 51/101  soft-tissue]
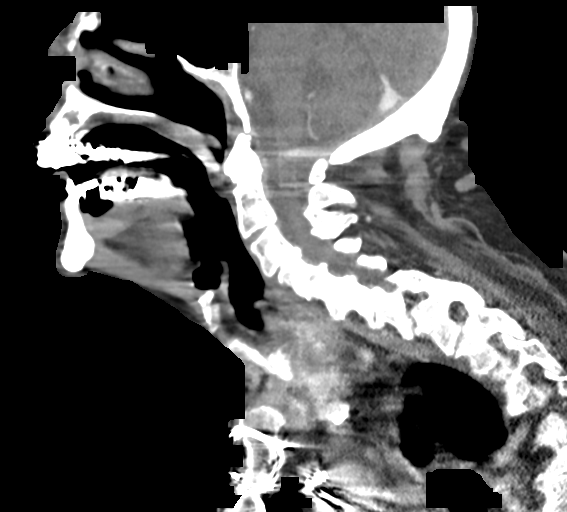
[im 51/101  bone]
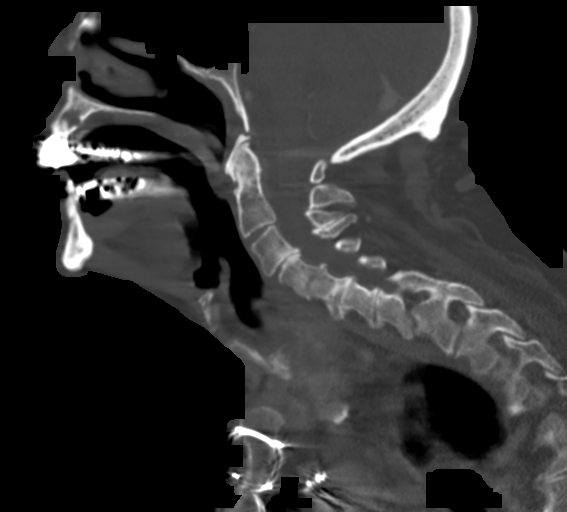
[im 59/101  bone]
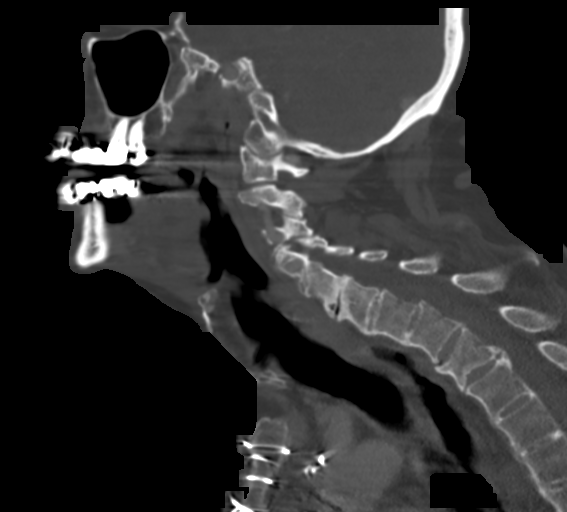
[im 67/101  bone]
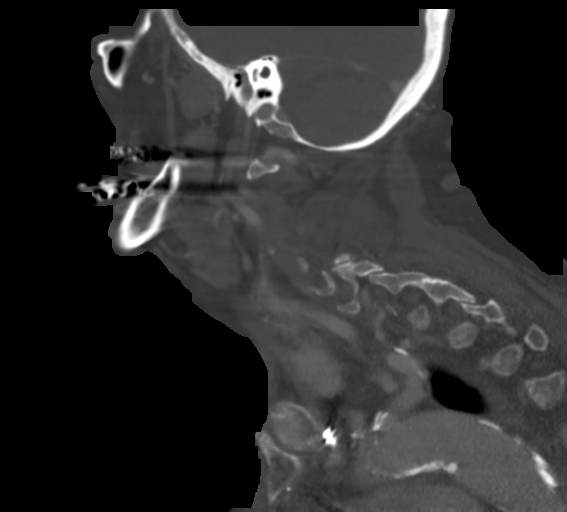

[Series 7: orthogonal st · axial · 0.39mm/px · z∈[-231,-100]mm · 4 of 113 slices shown]
[im 23/113  bone]
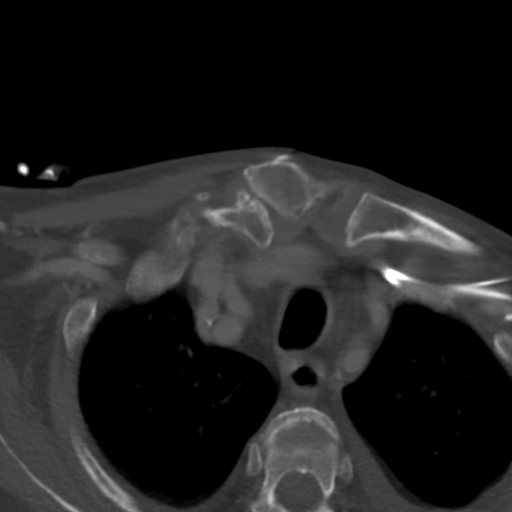
[im 45/113  bone]
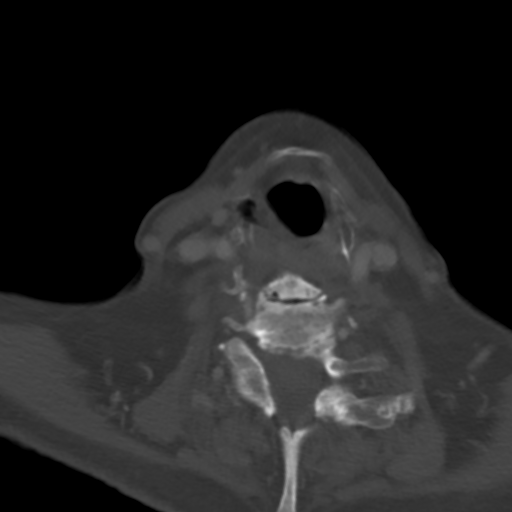
[im 68/113  bone]
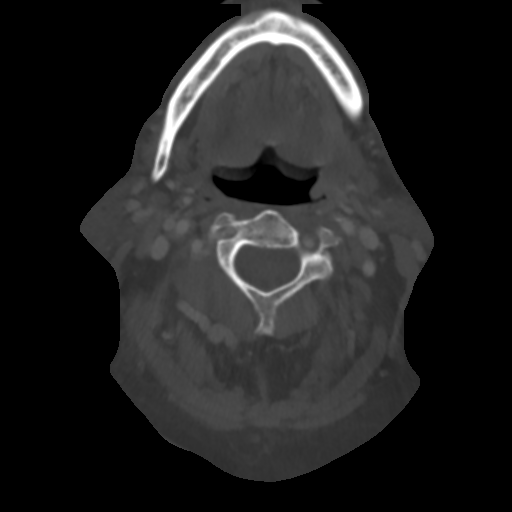
[im 90/113  bone]
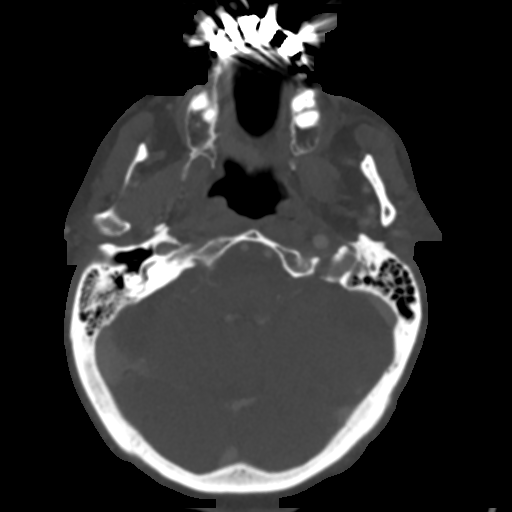

[15 of 33 positions shown; findings below may reference images not displayed]

FINDINGS: Pharynx and larynx: Negative.

Salivary glands: Negative.

Thyroid: Negative.

Lymph nodes: No adenopathy.

Vascular: Significant atherosclerotic changes including bilateral
carotid bifurcation calcification with 62% diameter stenosis
proximal right internal carotid artery and 64% diameter stenosis
proximal left internal carotid artery. Moderate to marked narrowing
right vertebral artery and mild to moderate narrowing left vertebral
artery at the level of foramen magnum.

Limited intracranial: Atrophy and small vessel disease changes.

Visualized orbits: Limited evaluation and unremarkable.

Mastoids and visualized paranasal sinuses: Minimal polypoid
opacification inferior right maxillary sinus.

Skeleton: Degenerative changes cervical spine most notable C3-4 and
C5-6 with fusion C4-5. Remote anterior wedge compression deformity
T2 with 50% loss height anteriorly.

Upper chest: Small to slightly moderate-size left-sided and small
right-sided pleural effusion with adjacent mild atelectasis.
Pacemaker in place.
IMPRESSION: Exam limited by motion and dental artifact without pharyngeal mass
noted.

Bilateral carotid bifurcation calcification with 62% diameter
stenosis proximal right internal carotid artery and 64% diameter
stenosis proximal left internal carotid artery. Moderate to marked
narrowing right vertebral artery and mild to moderate narrowing left
vertebral artery at the level of foramen magnum.

Degenerative changes cervical spine most notable C3-4 and C5-6 with
fusion C4-5. Remote anterior wedge compression deformity T2 with 50%
loss height anteriorly.

Small to slightly moderate-size left-sided and small right-sided
pleural effusion with adjacent mild atelectasis.

## 2017-03-02 IMAGING — DX DG CHEST 2V
2 series · 2 of 2 positions shown · non-contrast
Comparison: Prior chest x-ray 04/13/2015

CLINICAL DATA: 89-year-old male with wheezing, shortness of breath
chest

EXAM:
CHEST  2 VIEW

[x chest ap]
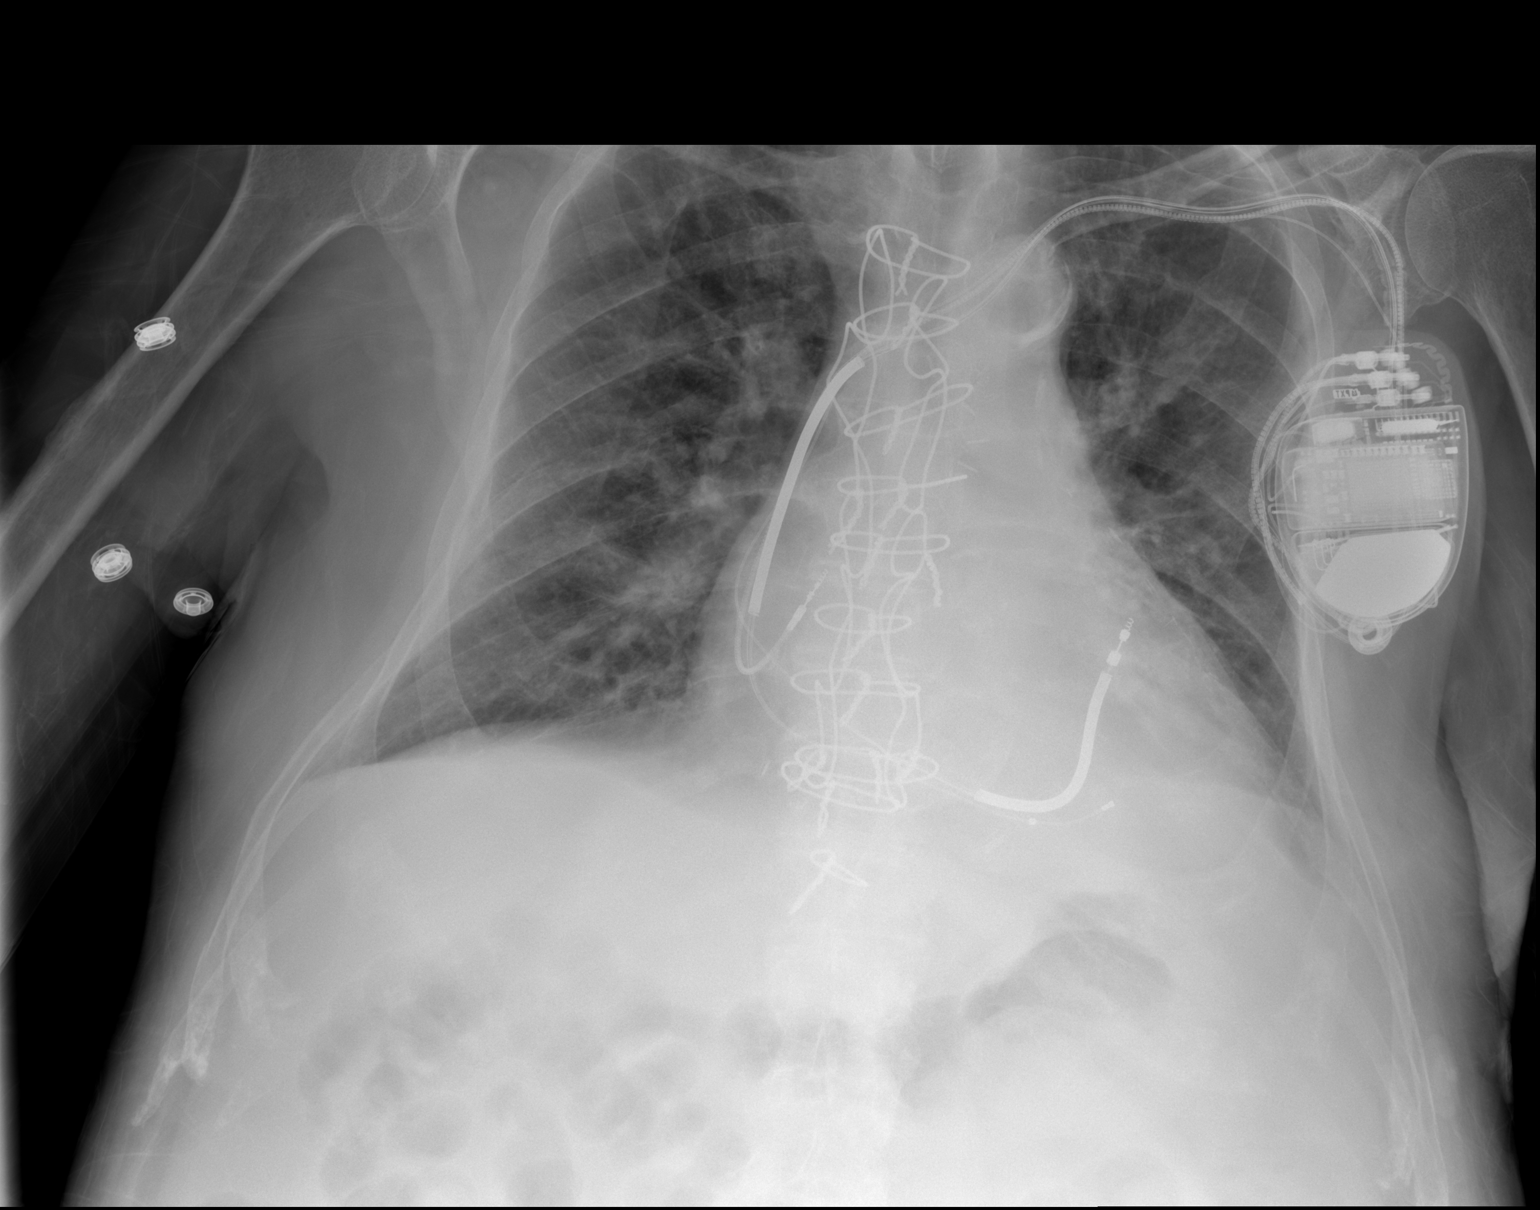

[w chest lat]
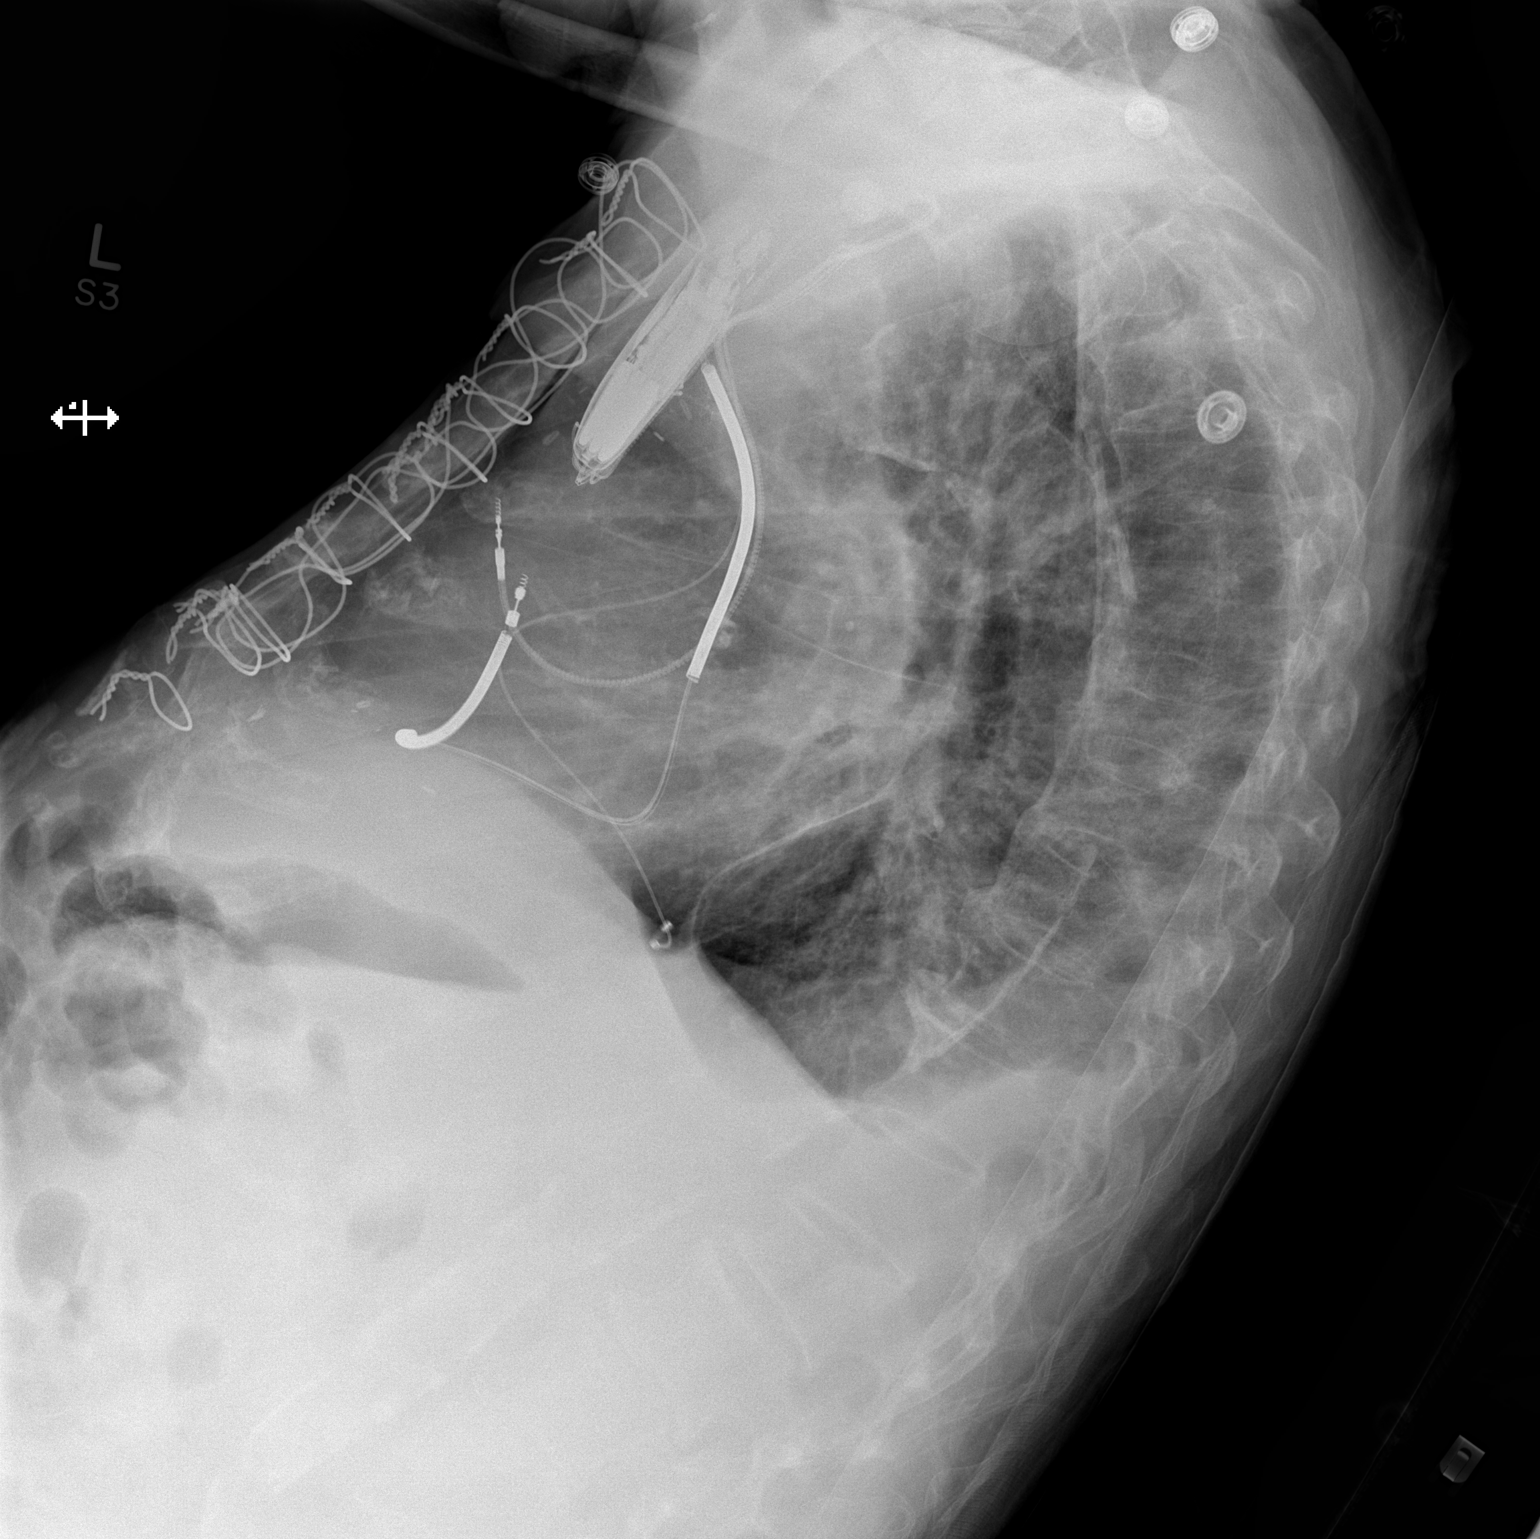

[2 of 2 positions shown; findings below may reference images not displayed]

FINDINGS: Stable position of left subclavian approach biventricular cardiac
rhythm maintenance device. The leads project over the right atrium,
right ventricle and overlying the left ventricle. Cardiac and
mediastinal contours are unchanged. Atherosclerotic calcifications
present in the transverse aorta. Patient is status post median
sternotomy. Slightly increased pulmonary vascular congestion without
overt edema. No pneumothorax. Small bilateral pleural effusions.
Compression deformity of L1. No acute osseous abnormality.
IMPRESSION: 1. Slightly increased pulmonary vascular congestion bordering on
mild edema.
2. Small bilateral pleural effusions.
3. Stable cardiomegaly.
4. Age indeterminate L1 compression fracture.  Favor remote.
